# Patient Record
Sex: Female | Born: 1965 | Hispanic: No | Marital: Married | Smoking: Never smoker
Health system: Southern US, Community
[De-identification: ages and names within clinical notes are randomized; demographics above are authoritative.]

## PROBLEM LIST (undated history)

## (undated) DIAGNOSIS — R079 Chest pain, unspecified: Secondary | ICD-10-CM

## (undated) DIAGNOSIS — K2970 Gastritis, unspecified, without bleeding: Secondary | ICD-10-CM

## (undated) DIAGNOSIS — I10 Essential (primary) hypertension: Secondary | ICD-10-CM

## (undated) DIAGNOSIS — I8390 Asymptomatic varicose veins of unspecified lower extremity: Secondary | ICD-10-CM

## (undated) DIAGNOSIS — K219 Gastro-esophageal reflux disease without esophagitis: Secondary | ICD-10-CM

## (undated) DIAGNOSIS — E119 Type 2 diabetes mellitus without complications: Secondary | ICD-10-CM

## (undated) DIAGNOSIS — E785 Hyperlipidemia, unspecified: Secondary | ICD-10-CM

## (undated) HISTORY — DX: Chest pain, unspecified: R07.9

## (undated) HISTORY — DX: Gastritis, unspecified, without bleeding: K29.70

## (undated) HISTORY — DX: Gastro-esophageal reflux disease without esophagitis: K21.9

## (undated) HISTORY — DX: Essential (primary) hypertension: I10

## (undated) HISTORY — DX: Type 2 diabetes mellitus without complications: E11.9

## (undated) HISTORY — DX: Hyperlipidemia, unspecified: E78.5

## (undated) HISTORY — PX: COLONOSCOPY: SHX174

## (undated) HISTORY — DX: Asymptomatic varicose veins of unspecified lower extremity: I83.90

---

## 1998-03-16 ENCOUNTER — Emergency Department (HOSPITAL_COMMUNITY): Admission: EM | Admit: 1998-03-16 | Discharge: 1998-03-16 | Payer: Self-pay | Admitting: Emergency Medicine

## 1998-03-16 ENCOUNTER — Ambulatory Visit (HOSPITAL_COMMUNITY): Admission: RE | Admit: 1998-03-16 | Discharge: 1998-03-16 | Payer: Self-pay | Admitting: Emergency Medicine

## 1998-03-17 ENCOUNTER — Encounter: Payer: Self-pay | Admitting: Emergency Medicine

## 1998-04-02 ENCOUNTER — Other Ambulatory Visit: Admission: RE | Admit: 1998-04-02 | Discharge: 1998-04-02 | Payer: Self-pay | Admitting: Obstetrics and Gynecology

## 1998-06-06 ENCOUNTER — Ambulatory Visit (HOSPITAL_COMMUNITY): Admission: RE | Admit: 1998-06-06 | Discharge: 1998-06-06 | Payer: Self-pay | Admitting: Emergency Medicine

## 1998-06-06 ENCOUNTER — Encounter: Payer: Self-pay | Admitting: Emergency Medicine

## 1999-07-31 ENCOUNTER — Other Ambulatory Visit: Admission: RE | Admit: 1999-07-31 | Discharge: 1999-07-31 | Payer: Self-pay | Admitting: Family Medicine

## 2000-11-05 ENCOUNTER — Inpatient Hospital Stay (HOSPITAL_COMMUNITY): Admission: AD | Admit: 2000-11-05 | Discharge: 2000-11-05 | Payer: Self-pay | Admitting: Obstetrics and Gynecology

## 2000-11-07 ENCOUNTER — Inpatient Hospital Stay (HOSPITAL_COMMUNITY): Admission: AD | Admit: 2000-11-07 | Discharge: 2000-11-12 | Payer: Self-pay | Admitting: Obstetrics and Gynecology

## 2000-11-07 ENCOUNTER — Encounter (INDEPENDENT_AMBULATORY_CARE_PROVIDER_SITE_OTHER): Payer: Self-pay | Admitting: Specialist

## 2000-12-15 ENCOUNTER — Other Ambulatory Visit: Admission: RE | Admit: 2000-12-15 | Discharge: 2000-12-15 | Payer: Self-pay | Admitting: Obstetrics and Gynecology

## 2002-07-21 ENCOUNTER — Emergency Department (HOSPITAL_COMMUNITY): Admission: EM | Admit: 2002-07-21 | Discharge: 2002-07-21 | Payer: Self-pay | Admitting: Emergency Medicine

## 2002-10-01 ENCOUNTER — Ambulatory Visit: Admission: RE | Admit: 2002-10-01 | Discharge: 2002-10-01 | Payer: Self-pay | Admitting: Family Medicine

## 2002-10-01 ENCOUNTER — Encounter: Payer: Self-pay | Admitting: Family Medicine

## 2003-05-24 ENCOUNTER — Other Ambulatory Visit: Admission: RE | Admit: 2003-05-24 | Discharge: 2003-05-24 | Payer: Self-pay | Admitting: Obstetrics and Gynecology

## 2003-08-27 ENCOUNTER — Emergency Department (HOSPITAL_COMMUNITY): Admission: EM | Admit: 2003-08-27 | Discharge: 2003-08-27 | Payer: Self-pay | Admitting: Emergency Medicine

## 2004-02-19 ENCOUNTER — Observation Stay (HOSPITAL_COMMUNITY): Admission: RE | Admit: 2004-02-19 | Discharge: 2004-02-20 | Payer: Self-pay | Admitting: Obstetrics and Gynecology

## 2004-05-06 ENCOUNTER — Ambulatory Visit: Payer: Self-pay | Admitting: Gastroenterology

## 2004-05-12 ENCOUNTER — Ambulatory Visit: Payer: Self-pay | Admitting: Gastroenterology

## 2004-06-19 ENCOUNTER — Ambulatory Visit: Payer: Self-pay | Admitting: Gastroenterology

## 2005-02-03 ENCOUNTER — Other Ambulatory Visit: Admission: RE | Admit: 2005-02-03 | Discharge: 2005-02-03 | Payer: Self-pay | Admitting: Obstetrics and Gynecology

## 2005-09-23 ENCOUNTER — Emergency Department (HOSPITAL_COMMUNITY): Admission: EM | Admit: 2005-09-23 | Discharge: 2005-09-23 | Payer: Self-pay | Admitting: Emergency Medicine

## 2006-03-07 ENCOUNTER — Emergency Department (HOSPITAL_COMMUNITY): Admission: EM | Admit: 2006-03-07 | Discharge: 2006-03-07 | Payer: Self-pay | Admitting: Emergency Medicine

## 2007-02-23 ENCOUNTER — Encounter: Admission: RE | Admit: 2007-02-23 | Discharge: 2007-02-23 | Payer: Self-pay | Admitting: Internal Medicine

## 2007-10-18 ENCOUNTER — Emergency Department (HOSPITAL_COMMUNITY): Admission: EM | Admit: 2007-10-18 | Discharge: 2007-10-19 | Payer: Self-pay | Admitting: Emergency Medicine

## 2008-03-12 ENCOUNTER — Emergency Department (HOSPITAL_COMMUNITY): Admission: EM | Admit: 2008-03-12 | Discharge: 2008-03-12 | Payer: Self-pay | Admitting: *Deleted

## 2008-07-31 ENCOUNTER — Encounter: Admission: RE | Admit: 2008-07-31 | Discharge: 2008-07-31 | Payer: Self-pay | Admitting: Family Medicine

## 2008-09-26 ENCOUNTER — Ambulatory Visit: Payer: Self-pay | Admitting: Vascular Surgery

## 2008-12-14 ENCOUNTER — Ambulatory Visit: Payer: Self-pay | Admitting: Vascular Surgery

## 2009-01-04 ENCOUNTER — Observation Stay (HOSPITAL_COMMUNITY): Admission: EM | Admit: 2009-01-04 | Discharge: 2009-01-04 | Payer: Self-pay | Admitting: Emergency Medicine

## 2009-03-02 ENCOUNTER — Emergency Department (HOSPITAL_COMMUNITY): Admission: EM | Admit: 2009-03-02 | Discharge: 2009-03-02 | Payer: Self-pay | Admitting: Emergency Medicine

## 2010-06-30 ENCOUNTER — Encounter: Payer: Self-pay | Admitting: Family Medicine

## 2010-09-12 LAB — POCT I-STAT, CHEM 8
Calcium, Ion: 1.2 mmol/L (ref 1.12–1.32)
HCT: 42 % (ref 36.0–46.0)
Potassium: 3.3 mEq/L — ABNORMAL LOW (ref 3.5–5.1)
Sodium: 140 mEq/L (ref 135–145)

## 2010-09-12 LAB — COMPREHENSIVE METABOLIC PANEL
ALT: 20 U/L (ref 0–35)
Alkaline Phosphatase: 90 U/L (ref 39–117)
BUN: 9 mg/dL (ref 6–23)
CO2: 27 mEq/L (ref 19–32)
Chloride: 104 mEq/L (ref 96–112)
Creatinine, Ser: 0.66 mg/dL (ref 0.4–1.2)
GFR calc Af Amer: >60 mL/min (ref >60)
Sodium: 138 mEq/L (ref 135–145)
Total Bilirubin: 1.2 mg/dL (ref 0.3–1.2)
Total Protein: 7.7 g/dL (ref 6.0–8.3)

## 2010-09-12 LAB — URINALYSIS, ROUTINE W REFLEX MICROSCOPIC
Bilirubin Urine: NEGATIVE
Glucose, UA: NEGATIVE mg/dL
Ketones, ur: NEGATIVE mg/dL
Nitrite: NEGATIVE
Protein, ur: NEGATIVE mg/dL
Specific Gravity, Urine: 1.009 (ref 1.005–1.030)
Urobilinogen, UA: 0.2 mg/dL (ref 0.0–1.0)

## 2010-09-12 LAB — DIFFERENTIAL
Eosinophils Absolute: 0.1 10*3/uL (ref 0.0–0.7)
Monocytes Absolute: 0.7 10*3/uL (ref 0.1–1.0)
Neutrophils Relative %: 71 % (ref 43–77)

## 2010-09-12 LAB — URINE MICROSCOPIC-ADD ON

## 2010-09-12 LAB — CBC
MCHC: 34 g/dL (ref 30.0–36.0)
Platelets: 273 10*3/uL (ref 150–400)
RBC: 4.87 MIL/uL (ref 3.87–5.11)

## 2010-09-12 LAB — POCT PREGNANCY, URINE: Preg Test, Ur: NEGATIVE

## 2010-10-21 NOTE — Procedures (Signed)
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   EXAM:  Using color-flow imaging and pulse Doppler spectral analysis, the  right and left common femoral, superficial femoral, popliteal, posterior  tibial, greater and lesser saphenous veins are evaluated.  There is  evidence suggesting deep venous insufficiency in the right and left  lower extremity common femoral veins.   The right and left saphenofemoral junctions are not competent.  The  right and left greater saphenous veins are not competent with the  caliber as described below.   The right and left proximal short saphenous veins demonstrate  competency.   GSV Diameter (used if found to be incompetent only)                                            Right    Left  Proximal Greater Saphenous Vein           0.73 cm  0.53 cm  Proximal-to-mid-thigh                     cm       cm  Mid thigh                                 0.54 cm  0.44 cm  Mid-distal thigh                          cm       cm  Distal thigh                              0.80 cm  0.37 cm  Knee                                      0.51 cm  0.31 cm   IMPRESSION:  1. Right greater saphenous vein reflux is identified with the caliber      ranging from 0.43 cm to 0.80 cm knee to groin, the left from 0.31      cm to 0.53 cm.  2. The right and left greater saphenous veins are not aneurysmal.  3. The right and left greater saphenous veins are not tortuous.  4. The deep venous system is not competent in the common femoral      veins.  5. The right and left lesser saphenous veins are competent.       ___________________________________________  Larina Earthly, M.D.   AS/MEDQ  D:  09/27/2008  T:  09/27/2008  Job:  [id number]

## 2010-10-21 NOTE — Assessment & Plan Note (Signed)
OFFICE VISIT   LAURENA, VALKO  DOB:  Sep 27, 1965                                       12/14/2008  [id number]#:[id number]   Patient who presents today for continued follow-up of her bilateral  lower extremity venous pathology.  She has worn compression garments and  has had no relief of her pain.  This is worse than the right leg than on  the left and is markedly prominent over her calf varicosities and also  lateral ankle varices.  She has marked restriction in her activities of  daily living.   On physical exam, there has been no change.  By ultrasound, she does  have gross reflux in her great saphenous vein.   I discussed options with patient, explained the procedure of laser  ablation of her great saphenous vein and stab phlebectomy of multiple  tributaries varicosities.  She reports that she is having significant  pain and marked telangiectasia around her ankle; therefore, we would  recommend sclerotherapy in the same setting.  She no longer has  insurance since her last visit with [place].  We did explain the out-of-  pocket expense that would be associated with this procedure.  I have  stressed to the patient that this is not limb-threatening and would be  for symptom relief.  She will notify us should she wish to proceed with  treatment.   Larina Earthly, M.D.  Electronically Signed   TFE/MEDQ  D:  12/14/2008  T:  12/17/2008  Job:  [id number]

## 2010-10-21 NOTE — Consult Note (Signed)
NEW PATIENT CONSULTATION   [name], [name]  DOB:  05/09/1966                                       09/26/2008  NFAOZ#:[id number]   Patient presents today for evaluation of bilateral lower extremity  venous pathology.  She is a pleasant 45 year old female who reports  progressive pain over multiple tributary varicosities in both lower  extremities.  These are most pronounced in her right leg, both in her  medial calf and lateral right ankle.  She also has varicosities in her  left medial calf.  She reports that she has pain, most particularly at  the end of the prolonged standing throughout the day but does notice  some pain in the morning as well.  She reports that she does have  swelling at the end of the long day of standing as well.  She takes  Tylenol for the pain.  She is unable to take ibuprofen due to gastritis.  She does elevate her legs as much as possible and reports this does get  some mild relief.   PAST MEDICAL HISTORY:  Otherwise positive only for hypertension.  She  does not have any history of deep venous thrombosis or bleeding.   PHYSICAL EXAMINATION:  A well-developed and well-nourished Hispanic  female in no acute distress.  She has 2+ dorsalis pedis pulses  bilaterally.  She does have large tributary varicosities in both medial  calves and also in her right lateral ankle.   She underwent noninvasive vascular laboratory studies in our office, and  these reveal reflux throughout her greater saphenous veins bilaterally  with flow from the greater saphenous vein into these tributary  varicosities.   She had seen [facility] 1 month ago and was fitted for panty-  style graduated compression garments.  She reports that these have not  given her any relief to date, and she will continue in the use of these.  She reports that the doctors at this center were not on her insurance  plan, so she is seeing [place] for further evaluation.   I  discussed the significance of her venous hypertension with the  patient.  I explained the importance of continued elevation and  compression, as she is doing.  I plan to see her again in 2 months for  continued followup.  I did explain that her noninvasive vascular  laboratory studies show that she is an excellent candidate for ablation  of her saphenous vein and stab phlebectomy if conservative measures  fail.  We will see her again in 2 months.  We have also requested  records from [facility].   Larina Earthly, M.D.  Electronically Signed   TFE/MEDQ  D:  09/26/2008  T:  09/27/2008  Job:  [id number]

## 2010-10-24 NOTE — Op Note (Signed)
NAMEFABIAN, COCA NO.:  [id number]   MEDICAL RECORD NO.:  [id number]                   PATIENT TYPE:  AMB   LOCATION:  SDC                                  FACILITY:  [facility]   PHYSICIAN:  Miguel Aschoff, M.D.                    DATE OF BIRTH:  03/07/1962   DATE OF PROCEDURE:  02/19/2004  DATE OF DISCHARGE:                                 OPERATIVE REPORT   PREOPERATIVE DIAGNOSIS:  Chronic pelvic pain.   POSTOPERATIVE DIAGNOSES:  1.  Chronic pelvic pain.  2.  Pelvic adhesions and probable adenomyosis.   PROCEDURE:  Open diagnostic laparoscopy with laser uterosacral nerve  ablation and lysis of adhesions.   SURGEON:  Miguel Aschoff, M.D.   ANESTHESIA:  General.   COMPLICATIONS:  None.   JUSTIFICATION:  The patient is a 45 year old Hispanic female, gravida 5,  para 5-0-0-5 status post five cesarean sections and tubal sterilization.  The patient has been complaining of persistent lower abdominal pain  unresponsive to conservative medical therapy with antibiotics and  analgesics.  Workup as an outpatient with outpatient ultrasound did not  reveal a source of this pain and she presents now to undergo laparoscopy to  see if an etiology for the pain can be established and corrected.  The risks  and benefits of the procedure were discussed with the patient.   PROCEDURE IN DETAIL:  The patient was taken to the operating room, placed in  a supine position and general anesthesia was administered without  difficulty.  She was then placed in the dorsal lithotomy position, prepped  and draped in the usual sterile fashion.  The bladder was catheterized.  At  this point, a Hulka tenaculum was placed through the cervix and held.  Attention was then directed to the umbilicus where a small, infraumbilical  incision was made.  The incision was extended out through the subcutaneous  tissue until the fascia was identified.  The fascia was then incised and the  peritoneal  cavity was entered avoiding injury to any underlying structures.  At this point, the trocar to laparoscopic was placed and the abdomen was  insufflated with 3 L of CO2 under monitored pressure.  The laparoscope was  then inserted to allow visualization.  A 5 mm suprapubic port was  established under direct visualization.  Systematic inspection did not  reveal any adhesions on the anterior abdominal wall.  The uterus was  identified and appeared to be normal except for the presence of scarring in  the  lower uterine segment and the area of her previous cesarean sections  with the bladder peritoneum scar to the uterine wall.  The right tube was  inspected.  A portion of the tube was missing consistent with patient's  history of previous tubal sterilization.  The distal end was normal.  The  right ovary was normal.  On the left  side, a single band of adhesions was  noted running from the pelvic peritoneum to the left distal tube and  omentum.  This was easily lysed without difficulty.  The left ovary was  inspected and appeared to be normal except for the appearance of a small  cyst.  There appeared to be paratubal cysts present at the distal end of the  left tube.  The cul-de-sac was inspected and there was no definite  endometriosis noted.  The intestinal surfaces were unremarkable.  The liver  was unremarkable.  At this point the YAG laser was inserted through the  operating channel of the laparoscope and with 15 watts of power and with a  GRP 6 laser tip, it was elected to proceed with laser uterosacral nerve  ablation to see if the patient's pain could be corrected.  It was felt at  this point, due to the old cesarean sections, that the patient probably had  adenomyosis involving the C-section scar and this was contributing to her  pain.  The laser uterosacral nerve ablation was carried out without  difficulty with care to avoid any injury to the adjacent structures and  ureter.  At this  point, the YAG laser was used to open and drain the left  ovarian cyst as well as the left paratubal cyst.  Once this was done, a  final inspection was made for hemostasis.  Hemostasis appeared to be  excellent and at this point, it was elected to complete the procedure.  CO2  was allowed to escape.  All instruments were removed.  The fascia was closed  using running 0 Vicryl suture.  Subcutaneous tissue at the umbilicus was  closed using interrupted 0 Vicryl suture.  The skin incisions were closed  using subcuticular 3-0 Vicryl suture.  Port sites were then injected with  0.25% Marcaine.   Plan was for the patient to be sent home.  Medications for home include  Tylox, one every 3 hours as needed for pain and doxycycline one twice a day  x3 days.  The patient will be seen back in the office in 2-3 weeks for  follow-up exam.  She is to call with any problems such as fever, pain, or  heavy bleeding.                                               Miguel Aschoff, M.D.    AR/MEDQ  D:  02/19/2004  T:  02/19/2004  Job:  [id number]

## 2010-10-24 NOTE — Op Note (Signed)
[facility]  PatientALESANDRA, SMART Visit Number: [id number] MRN: [id number]          Service Type: OBS Location: 910A [id number] [id number] Attending Physician:  Marcelle Overlie Proc. Date: 11/07/00 Admit Date:  11/07/2000                             Operative Report  PREOPERATIVE DIAGNOSES:       1. Intrauterine pregnancy at 37-5/7 weeks.                               2. Previous cesarean section x 4.                               3. Spontaneous rupture of membranes.                               4. Desires permanent sterilization.  POSTOPERATIVE DIAGNOSES:      1. Intrauterine pregnancy at 37-5/7 weeks.                               2. Previous cesarean section x 4.                               3. Spontaneous rupture of membranes.                               4. Desires permanent sterilization.  PROCEDURE:                    Repeat low transverse cesarean section and bilateral tubal ligation.  SURGEON:                      Marcelle Overlie, M.D.  ANESTHESIA:                   Spinal.  ESTIMATED BLOOD LOSS:         500 cc.  FINDINGS:                     Female infant in the cephalic presentation with Apgars of 9 at one minute and 9 at five minutes with a weight of 6 lb 9 oz. Normal adnexa.  DESCRIPTION OF PROCEDURE:     The patient was taken to the operating room.  A spinal was placed and the patient was then prepped and draped in the usual sterile fashion after a Foley was placed in the bladder.  Using a scalpel, a low transverse incision was made and carried down to the fascia using electrocautery.  The fascia was scored in the midline and extended laterally. A Pfannenstiel incision was created.  The rectus muscles were separated.  THe peritoneum was entered bluntly.  The peritoneal incision was then extended superiorly and inferiorly.  A bladder blade was inserted.  The lower uterine segment was identified.  A bladder flap was then created  sharply and digitally and the bladder blade was readjusted.  A low transverse incision was made in the uterus and amniotic fluid was noted to be clear  upon entry into the amniotic cavity.  The baby was in the cephalic presentation.  It was a female infant with Apgars of 9 at one minute and 9 at five minutes, weighing 6 lb 9 oz.  Cord blood was obtained.  The placenta was manually removed and noted to be normal and intact.  The uterus was cleared of all clots and debris.  The uterine incision was closed in a single layer using 0 chromic in continuous running lock stitch.  Attention was then turned to the fallopian tubes, where both fallopian tubes were visualized and the fimbriated ends were easily seen. Using a Babcock clamp, the mid  portion of the tube was then grasped on both sides and a modified Pomeroy tubal ligation was performed bilaterally by tying off a 2 cm knuckle using 0 plain gut suture x 2.  The knuckle was excised using Metzenbaum scissors.  Both tubal segments were sent to pathology.  The uterine incision was then reinspected and noted to be hemostatic.  The peritoneum was closed using 0 Vicryl in continuous running stitch.  The fascia was closed using 0 Vicryl in continuous running stitch starting at each corner and meeting in the midline.  After inspection of the subcutaneous layer, the skin was closed with staples.  All sponge, lap, and instrument counts were correct x 2.  The patient tolerated the procedure well and went to the recovery room in stable condition. Attending Physician:  Marcelle Overlie DD:  11/07/00 TD:  11/08/00 Job: [id number]

## 2010-10-24 NOTE — Discharge Summary (Signed)
NAMEJOSALYNN, JOHNDROW NO.:  [id number]   MEDICAL RECORD NO.:  [id number]          PATIENT TYPE:  OBV   LOCATION:  9316                          FACILITY:  [facility]   PHYSICIAN:  Miguel Aschoff, M.D.       DATE OF BIRTH:  03/07/1962   DATE OF ADMISSION:  02/19/2004  DATE OF DISCHARGE:  02/20/2004                                 DISCHARGE SUMMARY   ADMISSION DIAGNOSIS:  Chronic pelvic pain.   FINAL DIAGNOSIS:  Chronic pelvic pain with probable adenomyosis.   PROCEDURE:  Diagnostic laparoscopy with lysis of adhesions and laser  uterosacral nerve ablation.   HISTORY OF PRESENT ILLNESS:  The patient is a 45 year old Hispanic female  with history of persistent lower abdominal pain and left lower quadrant  pain.  The patient has had four previous C-sections.  She was also  laparoscoped in 1997 and found to have pelvic adhesions.  Because of the  recurrent symptomatology, she is being taken to the operating room to  undergo repeat laparoscopy to see if the etiology for the pain can be  established and corrected.   HOSPITAL COURSE:  Preoperative labs were obtained.  On February 19, 2004,  the patient was taken to the operating room where an open laparoscopy was  carried out.  At that time she was found to only have a very few adhesions,  and the only other remarkable finding was that the uterus appeared to be  globular.  In view of the previous cesarean sections, it was felt that the  patient probably had adenomyosis as an etiology of her pain, with  adenomyosis involving the C-section scars.  In an attempt to relieve the  pain, a laser uterosacral ablation was carried out of the uterosacral  ligament.  This was done without difficulty.  The patient was observed  overnight, and by February 20, 2004 the patient was in satisfactory  condition and felt to be stable enough to be discharged home.   DISCHARGE MEDICATIONS:  1.  Tylox one every three hours as needed for pain.  2.   Doxycycline 100 mg twice a day x3 days.   DISCHARGE INSTRUCTIONS:  She was instructed to do no heavy lifting, to place  nothing in the vagina, and call if there are any problems such as fever,  pain, or heavy bleeding.   FOLLOW UP:  She is to be seen back in the office in four weeks for followup  examination.      AR/MEDQ  D:  03/23/2004  T:  03/24/2004  Job:  [id number]

## 2010-10-24 NOTE — Discharge Summary (Signed)
[facility]  Patient:    [name] [name], [name] [name]                        MRN: [id number] Adm. Date:  [id number] Disc. Date: [id number] Attending:  Marcelle Overlie Dictator:   Leilani Able, P.A.                           Discharge Summary  FINAL DIAGNOSES:              1. Intrauterine pregnancy at 37-5/[redacted] weeks                                  gestation.                               2. History of previous cesarean section x 4.                               3. Spontaneous rupture of membranes.                               4. Desires permanent sterilization.                               5. Postoperative endomyometritis.  PROCEDURE:                    Repeat low transverse cesarean section and                               bilateral tubal ligation.  SURGEON:                      Marcelle Overlie, M.D.  COMPLICATIONS:                None.  HOSPITAL COURSE:              This 45 year old, G5, P4, presents at 37-5/[redacted] weeks gestation in labor. The patients prenatal course was complicated by a history of cesarean section x 4 and advanced maternal age. The patient declined amniocentesis but did have an AFP test performed that came back normal. The patient was taken to the operating room on November 07, 2000 by Dr. Marcelle Overlie where a repeat low transverse cesarean section was performed with the delivery of a 6-pound 9-ounce female infant with Apgars of 9 and 9. Delivery went without complications. At this point, a bilateral tubal ligation was performed using the modified Pomeroy method. That procedure went without complications as well.  The patients postoperative course was complicated by lower abdominal pain and heavy bleeding that was felt to be endomyometritis.  The patient never had any high fevers with this. She was started, on postoperative day #3, on Unasyn 3 g every six hours and was kept for evaluation. The patient also had some headaches  postoperatively. Anesthesia took care of this using caffeine, fluids, position and headaches were better. By postoperative day #5, the patients abdominal pain was resolved and so were headaches. She was felt ready for discharge. She was sent home  on a regular diet and told to decrease activities.  DISCHARGE MEDICATIONS:        1. Tylox, #30, one every six hours as needed                                  for pain.                               2. Continue prenatal vitamins.  DISCHARGE FOLLOWUP:           The patient was told to follow up in the office in four weeks. DD:  12/02/00 TD:  12/02/00 Job: [id number]

## 2011-07-01 ENCOUNTER — Encounter: Payer: Self-pay | Admitting: Vascular Surgery

## 2011-07-06 ENCOUNTER — Encounter: Payer: Self-pay | Admitting: Vascular Surgery

## 2011-07-07 ENCOUNTER — Encounter: Payer: Self-pay | Admitting: Vascular Surgery

## 2011-07-07 ENCOUNTER — Ambulatory Visit (INDEPENDENT_AMBULATORY_CARE_PROVIDER_SITE_OTHER): Payer: BC Managed Care – PPO | Admitting: Vascular Surgery

## 2011-07-07 VITALS — BP 155/91 | HR 80 | Resp 16 | Ht 62.0 in | Wt 135.0 lb

## 2011-07-07 DIAGNOSIS — I83893 Varicose veins of bilateral lower extremities with other complications: Secondary | ICD-10-CM

## 2011-07-07 DIAGNOSIS — Z01810 Encounter for preprocedural cardiovascular examination: Secondary | ICD-10-CM

## 2011-07-07 DIAGNOSIS — M79609 Pain in unspecified limb: Secondary | ICD-10-CM

## 2011-07-07 DIAGNOSIS — I8390 Asymptomatic varicose veins of unspecified lower extremity: Secondary | ICD-10-CM | POA: Insufficient documentation

## 2011-07-07 NOTE — Progress Notes (Signed)
The patient has today for continued discussion regarding her venous varicosities. Last seen her in July 2010. She has severe venous hypertension bilaterally with bilateral varicose veins. She reports increasing pain. She had has been compliant with her compression garments but reports despite this she continues to have pain over the varicosities in her medial calves bilaterally and on her right lateral area above her ankle. Her past medical history is otherwise unchanged she has no history of DVT.  Past Medical History  Diagnosis Date  . Hypertension   . Varicose veins   . Acid reflux   . Gastritis   . Endometriosis     History  Substance Use Topics  . Smoking status: Former Games developer  . Smokeless tobacco: Not on file  . Alcohol Use: No    History reviewed. No pertinent family history.  No Known Allergies  Current outpatient prescriptions:butalbital-acetaminophen-caffeine (FIORICET, ESGIC) 50-325-40 MG per tablet, as needed., Disp: , Rfl: ;  enalapril-hydrochlorothiazide (VASERETIC) 10-25 MG per tablet, daily., Disp: , Rfl: ;  fenofibrate (TRICOR) 48 MG tablet, , Disp: , Rfl: ;  FLUoxetine (PROZAC) 20 MG capsule, , Disp: , Rfl: ;  ketoconazole (NIZORAL) 2 % cream, as needed., Disp: , Rfl:   BP 155/91  Pulse 80  Resp 16  Ht 5\' 2"  (1.575 m)  Wt 135 lb (61.236 kg)  BMI 24.69 kg/m2  SpO2 99%  Body mass index is 24.69 kg/(m^2).       Physical exam a well-nourished female appearing stated age in no acute distress. She has 2+ radial pulses bilaterally and 2+ dorsalis pedis pulses bilaterally. She does have marked varicosities in her medial calves and lateral and bilaterally worse on the right than on the left. She underwent repeat venous duplex Rovsing shows no change. There is no evidence of DVT. She does have reflux in her great saphenous vein and also her small saphenous veins bilaterally. On imaging these with the SonoSite it appears that they are related to her right saphenous vein  reflux.  Impression and plan: I have explained the critical importance of elevation and thigh-high graduated compression. We will see her again in 3 months for continued discussion. I feel that she would be an outstanding candidate for staged bilateral laser ablation and stab phlebectomy for relief of her symptoms. We'll discuss this further in 3 months

## 2011-07-09 NOTE — Procedures (Unsigned)
LOWER EXTREMITY VENOUS REFLUX EXAM  INDICATION:  Varicose veins and lower extremity pain.  EXAM:  Using color-flow imaging and pulse Doppler spectral analysis, the bilateral common femoral, superficial femoral, popliteal, posterior tibial, greater and lesser saphenous veins are evaluated.  There is evidence suggesting deep venous insufficiency in the right posterior tibial vein.  There is no evidence suggesting deep venous insufficiency in the remainder of the bilateral lower extremities.  The bilateral saphenofemoral junctions are competent. The bilateral GSVs are not competent with Reflux of >554milliseconds with the caliber as described below.   The bilateral proximal short saphenous vein demonstrates incompetency. The right diameter measurements range from 0.27 to 0.48 cm.  The left diameter measurements range from 0.19 cm to 0.37 cm.  GSV Diameter (used if found to be incompetent only)                                           Right    Left Proximal Greater Saphenous Vein           0.62 cm  0.66 cm Proximal-to-mid-thigh                     0.58 cm  0.57 cm Mid thigh                                 0.58 cm  0.43 cm Mid-distal thigh                          cm       cm Distal thigh                              0.79 cm  0.42 cm Knee                                      0.54 cm  0.72 cm  IMPRESSION: 1. The bilateral greater saphenous veins are not competent, with     reflux of >500 milliseconds. 2. The bilateral greater saphenous veins and right small saphenous     vein are not tortuous. 3. The left small saphenous vein is tortuous. 4. The deep venous system of the right posterior tibial vein is not     competent, with reflux of >500 milliseconds. 5. The remainder of the deep venous system is competent. 6. The bilateral small saphenous veins are not competent, with reflux     of >500 milliseconds. 7. There is a perforator vein measuring 0.2 cm involving the  right mid     medial calf that is incompetent, with reflux of >500 milliseconds. 8. There is an incompetent perforator vein measuring 0.2 cm in     diameter involving the left mid/distal medial calf, with reflux of     >500 milliseconds.       ___________________________________________ Larina Earthly, M.D.  SH/MEDQ  D:  07/07/2011  T:  07/07/2011  Job:  [id number]

## 2011-08-20 ENCOUNTER — Other Ambulatory Visit: Payer: Self-pay | Admitting: Geriatric Medicine

## 2011-08-20 DIAGNOSIS — R51 Headache: Secondary | ICD-10-CM

## 2011-08-24 ENCOUNTER — Other Ambulatory Visit: Payer: BC Managed Care – PPO

## 2011-10-05 ENCOUNTER — Encounter: Payer: Self-pay | Admitting: Vascular Surgery

## 2011-10-06 ENCOUNTER — Ambulatory Visit (INDEPENDENT_AMBULATORY_CARE_PROVIDER_SITE_OTHER): Payer: BC Managed Care – PPO | Admitting: Vascular Surgery

## 2011-10-06 ENCOUNTER — Encounter: Payer: Self-pay | Admitting: Vascular Surgery

## 2011-10-06 VITALS — BP 142/84 | HR 69 | Resp 18 | Ht 62.0 in | Wt 145.6 lb

## 2011-10-06 DIAGNOSIS — I83893 Varicose veins of bilateral lower extremities with other complications: Secondary | ICD-10-CM

## 2011-10-06 NOTE — Progress Notes (Signed)
Problems with Activities of Daily Living Secondary to Leg Pain  1. 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  This is very difficult due to leg pain.    2. Mrs. [name] [name] states cooking, cleaning, and shopping are difficult due to leg pain.    [name], [name] Rhymes   Failure of  Conservative Therapy:  1. Worn 20-30 mm Hg thigh high compression hose >3 months with no relief of symptoms.  2. Frequently elevates legs-no relief of symptoms  3. Taken Ibuprofen 600 Mg TID with no relief of symptoms.  The patient has today for continued discussion regarding her venous hypertension. She has worn her thigh graduated compression stockings and continues to have significant pain associated with this. Is mainly over the varicosities in her medial calves and at the level of her right distal above ankle lateral leg. She has is despite elevation and compression. This does interfere with her activities of daily living. I have recommended staged bilateral laser ablation of her great saphenous vein and stab phlebectomy of tributary varicosities. 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 She does have some small saphenous vein reflux. I would not recommend treating these currently in the same that she will have good relief with a great saphenous ablation. She has persistent symptoms she may require small saphenous ablation in the future. She wish to proceed as soon as possible

## 2011-10-13 ENCOUNTER — Telehealth: Payer: Self-pay | Admitting: *Deleted

## 2011-10-13 NOTE — Telephone Encounter (Signed)
Left detailed phone message explaining that [name] [name] has denied office surgery (CPT [phone] units) and [id number] (2 units).  Explained I had reviewed denial and her case with Dr. Arbie Cookey and that an appeal/peer-to-peer review would not overturn denial decision.  Recommended that she return to office in 6 months for repeat venous duplex and office visit and that we could initiate pre-certification of office surgery at that time. [name] [name], Neena Rhymes

## 2011-10-17 ENCOUNTER — Emergency Department (HOSPITAL_COMMUNITY)
Admission: EM | Admit: 2011-10-17 | Discharge: 2011-10-18 | Disposition: A | Discharge status: Home / Self Care | Payer: BC Managed Care – PPO | Attending: Emergency Medicine | Admitting: Emergency Medicine

## 2011-10-17 DIAGNOSIS — I10 Essential (primary) hypertension: Secondary | ICD-10-CM | POA: Insufficient documentation

## 2011-10-17 DIAGNOSIS — K219 Gastro-esophageal reflux disease without esophagitis: Secondary | ICD-10-CM | POA: Insufficient documentation

## 2011-10-17 DIAGNOSIS — R7401 Elevation of levels of liver transaminase levels: Secondary | ICD-10-CM | POA: Insufficient documentation

## 2011-10-17 DIAGNOSIS — R7402 Elevation of levels of lactic acid dehydrogenase (LDH): Secondary | ICD-10-CM | POA: Insufficient documentation

## 2011-10-17 DIAGNOSIS — R1013 Epigastric pain: Secondary | ICD-10-CM

## 2011-10-17 DIAGNOSIS — R110 Nausea: Secondary | ICD-10-CM | POA: Insufficient documentation

## 2011-10-18 ENCOUNTER — Encounter (HOSPITAL_COMMUNITY): Payer: Self-pay | Admitting: Emergency Medicine

## 2011-10-18 ENCOUNTER — Emergency Department (HOSPITAL_COMMUNITY): Payer: BC Managed Care – PPO

## 2011-10-18 LAB — DIFFERENTIAL
Basophils Relative: 0 % (ref 0–1)
Eosinophils Absolute: 0.2 10*3/uL (ref 0.0–0.7)
Monocytes Relative: 8 % (ref 3–12)
Neutrophils Relative %: 36 % — ABNORMAL LOW (ref 43–77)

## 2011-10-18 LAB — COMPREHENSIVE METABOLIC PANEL
Albumin: 3.9 g/dL (ref 3.5–5.2)
Alkaline Phosphatase: 130 U/L — ABNORMAL HIGH (ref 39–117)
BUN: 11 mg/dL (ref 6–23)
Potassium: 3.5 mEq/L (ref 3.5–5.1)
Total Protein: 7.7 g/dL (ref 6.0–8.3)

## 2011-10-18 LAB — CBC
Hemoglobin: 13.5 g/dL (ref 12.0–15.0)
MCH: 27.7 pg (ref 26.0–34.0)
MCHC: 34.1 g/dL (ref 30.0–36.0)
Platelets: 234 10*3/uL (ref 150–400)
RDW: 14 % (ref 11.5–15.5)

## 2011-10-18 LAB — URINALYSIS, ROUTINE W REFLEX MICROSCOPIC
Bilirubin Urine: NEGATIVE
Ketones, ur: NEGATIVE mg/dL
Nitrite: NEGATIVE
Protein, ur: NEGATIVE mg/dL
Urobilinogen, UA: 1 mg/dL (ref 0.0–1.0)

## 2011-10-18 LAB — POCT PREGNANCY, URINE: Preg Test, Ur: NEGATIVE

## 2011-10-18 LAB — LIPASE, BLOOD: Lipase: 55 U/L (ref 11–59)

## 2011-10-18 MED ORDER — MONTELUKAST SODIUM 10 MG PO TABS: 10.0000 mg | ORAL_TABLET | Freq: Every day | ORAL | Status: DC

## 2011-10-18 MED ORDER — OXYCODONE-ACETAMINOPHEN 5-325 MG PO TABS: 2.0000 | ORAL_TABLET | ORAL | Status: AC | PRN

## 2011-10-18 MED ORDER — SODIUM CHLORIDE 0.9 % IV SOLN
80.0000 mg | Freq: Once | INTRAVENOUS | Status: AC
  Administered 2011-10-18: 80 mg via INTRAVENOUS
  Filled 2011-10-18: qty 80

## 2011-10-18 MED ORDER — PANTOPRAZOLE SODIUM 20 MG PO TBEC: 40.0000 mg | DELAYED_RELEASE_TABLET | Freq: Every day | ORAL | Status: DC

## 2011-10-18 MED ORDER — HYDROMORPHONE HCL PF 1 MG/ML IJ SOLN
1.0000 mg | Freq: Once | INTRAMUSCULAR | Status: AC
  Administered 2011-10-18: 1 mg via INTRAVENOUS
  Filled 2011-10-18: qty 1

## 2011-10-18 MED ORDER — ONDANSETRON HCL 4 MG/2ML IJ SOLN
4.0000 mg | Freq: Once | INTRAMUSCULAR | Status: AC
  Administered 2011-10-18: 4 mg via INTRAVENOUS
  Filled 2011-10-18: qty 2

## 2011-10-18 MED ORDER — ONDANSETRON HCL 4 MG PO TABS: 4.0000 mg | ORAL_TABLET | Freq: Four times a day (QID) | ORAL | Status: AC

## 2011-10-18 NOTE — ED Notes (Signed)
NURSE EXPLAINED DELAY AND PROCESS TO PT. AND FAMILY , ADVISED THAT PT. WILL BE MOVED TO A EXAM ROOM AS SOON AS AVAILABLE.

## 2011-10-18 NOTE — Discharge Instructions (Signed)
Please take medications as prescribed. Followup with your primary care doctor and/or gastroenterology if symptoms continue. Stick to a bland diet. Return to emergency room for worsening condition or new concerning symptoms.  Abdominal Pain Abdominal pain can be caused by many things. Your caregiver decides the seriousness of your pain by an examination and possibly blood tests and X-rays. Many cases can be observed and treated at home. Most abdominal pain is not caused by a disease and will probably improve without treatment. However, in many cases, more time must pass before a clear cause of the pain can be found. Before that point, it may not be known if you need more testing, or if hospitalization or surgery is needed. HOME CARE INSTRUCTIONS   Do not take laxatives unless directed by your caregiver.   Take pain medicine only as directed by your caregiver.   Only take over-the-counter or prescription medicines for pain, discomfort, or fever as directed by your caregiver.   Try a clear liquid diet (broth, tea, or water) for as long as directed by your caregiver. Slowly move to a bland diet as tolerated.  SEEK IMMEDIATE MEDICAL CARE IF:   The pain does not go away.   You have a fever.   You keep throwing up (vomiting).   The pain is felt only in portions of the abdomen. Pain in the right side could possibly be appendicitis. In an adult, pain in the left lower portion of the abdomen could be colitis or diverticulitis.   You pass bloody or black tarry stools.  MAKE SURE YOU:   Understand these instructions.   Will watch your condition.   Will get help right away if you are not doing well or get worse.  Document Released: 03/04/2005 Document Revised: 05/14/2011 Document Reviewed: 01/11/2008 [facility] Patient Information 2012 [place], [place].  Abdominal Pain Abdominal pain can be caused by many things. Your caregiver decides the seriousness of your pain by an examination and possibly  blood tests and X-rays. Many cases can be observed and treated at home. Most abdominal pain is not caused by a disease and will probably improve without treatment. However, in many cases, more time must pass before a clear cause of the pain can be found. Before that point, it may not be known if you need more testing, or if hospitalization or surgery is needed. HOME CARE INSTRUCTIONS   Do not take laxatives unless directed by your caregiver.   Take pain medicine only as directed by your caregiver.   Only take over-the-counter or prescription medicines for pain, discomfort, or fever as directed by your caregiver.   Try a clear liquid diet (broth, tea, or water) for as long as directed by your caregiver. Slowly move to a bland diet as tolerated.  SEEK IMMEDIATE MEDICAL CARE IF:   The pain does not go away.   You have a fever.   You keep throwing up (vomiting).   The pain is felt only in portions of the abdomen. Pain in the right side could possibly be appendicitis. In an adult, pain in the left lower portion of the abdomen could be colitis or diverticulitis.   You pass bloody or black tarry stools.  MAKE SURE YOU:   Understand these instructions.   Will watch your condition.   Will get help right away if you are not doing well or get worse.  Document Released: 03/04/2005 Document Revised: 05/14/2011 Document Reviewed: 01/11/2008 [facility] Patient Information 2012 [place], [place].

## 2011-10-18 NOTE — ED Notes (Signed)
Family at bedside and pt is resting.

## 2011-10-18 NOTE — ED Notes (Signed)
Pt c/o mid abdominal pain that started 2 hours ago "after taking blood pressure medicine".  Pain increases with palpation and is epigastric and substernal.  Denies n/v/d.  When pain started, pt felt SOB.  Does not feel SOB at this time.  States left arm also hurts and this began 30 minutes ago.  Pt Spanish speaking, speaks some English but family helping to translate.

## 2011-10-18 NOTE — ED Notes (Signed)
Small bumps on cheeks now, maybe hives.  Pt states they itch a little bit.

## 2011-10-18 NOTE — ED Notes (Signed)
FAMILY REPORTS MID ABDOMINAL PAIN WITH SOB ONSET THIS EVENING , DENIES NAUSEA OR VOMITTING /DIARRHEA.

## 2011-10-18 NOTE — ED Notes (Signed)
Rx x 4, pt voiced understanding to f/u with gastroenterologist for worsening pain.

## 2011-10-19 ENCOUNTER — Telehealth: Payer: Self-pay | Admitting: *Deleted

## 2011-10-19 ENCOUNTER — Other Ambulatory Visit: Payer: Self-pay | Admitting: *Deleted

## 2011-10-19 DIAGNOSIS — I83893 Varicose veins of bilateral lower extremities with other complications: Secondary | ICD-10-CM

## 2011-10-19 NOTE — Telephone Encounter (Signed)
Spoke directly to [name] [name] about [employer] denial of office surgery for CPT codes 62952 ( endovenous laser ablation) and stab phlebectomies  (CPT [phone]).   Conveyed to her that Dr. Arbie Cookey had reviewed the denial and her chart and recommended that she return in 6 months for repeat bilateral venous reflux exam and follow up with Dr. Arbie Cookey.  [name] [name] expressed disappointment with denial but verbalized understanding and agreed with plan to return in 6 months for venous reflux exam and follow up with Dr. Arbie Cookey.  Front desk staff to make appointments and notify Ms. Zanella.  Ananiah Maciolek, Neena Rhymes

## 2011-10-20 NOTE — ED Provider Notes (Signed)
History     CSN: [id number]  Arrival date & time 10/17/11  2356   First MD Initiated Contact with Patient 10/18/11 0214      Chief Complaint  Patient presents with  . Abdominal Pain    (Consider location/radiation/quality/duration/timing/severity/associated sxs/prior treatment) HPI 46 yo female presents to the ER tonight with c/o upper abdominal pain, nausea.  Pt reports she has history of gastritis, has been off her PPI.  Over the last several weeks she has has increasing nausea.  No fevers, no change in weight, no vomiting.  Pain goes up into her back.  No sob, no chest pain.  Pain tonight started after taking her nightly medications.   Past Medical History  Diagnosis Date  . Hypertension   . Varicose veins   . Acid reflux   . Gastritis   . Endometriosis   . Migraine     Past Surgical History  Procedure Date  . Cesarean section     No family history on file.  History  Substance Use Topics  . Smoking status: Never Smoker   . Smokeless tobacco: Not on file  . Alcohol Use: No    OB History    Grav Para Term Preterm Abortions TAB SAB Ect Mult Living                  Review of Systems  All other systems reviewed and are negative.    Allergies  Review of patient's allergies indicates no known allergies.  Home Medications   Current Outpatient Rx  Name Route Sig Dispense Refill  . BUTALBITAL-APAP-CAFFEINE 50-325-40 MG PO TABS Oral Take 1 tablet by mouth every 8 (eight) hours as needed. For migraine    . FENOFIBRATE 48 MG PO TABS Oral Take 48 mg by mouth daily.     Marland Kitchen LISINOPRIL-HYDROCHLOROTHIAZIDE 20-25 MG PO TABS Oral Take 1 tablet by mouth daily.    Marland Kitchen MONTELUKAST SODIUM 10 MG PO TABS Oral Take 1 tablet (10 mg total) by mouth at bedtime. 20 tablet 0  . ONDANSETRON HCL 4 MG PO TABS Oral Take 1 tablet (4 mg total) by mouth every 6 (six) hours. PRN nausea 12 tablet 0  . OXYCODONE-ACETAMINOPHEN 5-325 MG PO TABS Oral Take 2 tablets by mouth every 4 (four) hours as  needed for pain. 20 tablet 0  . PANTOPRAZOLE SODIUM 20 MG PO TBEC Oral Take 2 tablets (40 mg total) by mouth daily. 60 tablet 0    BP 146/88  Pulse 98  Temp(Src) 98.6 F (37 C) (Oral)  Resp 18  SpO2 100%  LMP 10/06/2011  Physical Exam  Nursing note and vitals reviewed. Constitutional: She is oriented to person, place, and time. She appears well-developed and well-nourished. She appears distressed.  HENT:  Head: Normocephalic and atraumatic.  Nose: Nose normal.  Mouth/Throat: Oropharynx is clear and moist.  Eyes: Conjunctivae and EOM are normal. Pupils are equal, round, and reactive to light.  Neck: Normal range of motion. Neck supple. No JVD present. No tracheal deviation present. No thyromegaly present.  Cardiovascular: Normal rate, regular rhythm, normal heart sounds and intact distal pulses.  Exam reveals no gallop and no friction rub.   No murmur heard. Pulmonary/Chest: Effort normal and breath sounds normal. No stridor. No respiratory distress. She has no wheezes. She has no rales. She exhibits no tenderness.  Abdominal: Soft. Bowel sounds are normal. She exhibits no distension and no mass. There is tenderness (in epigastrium and in RUQ). There is no rebound and  no guarding.  Musculoskeletal: Normal range of motion. She exhibits no edema and no tenderness.  Lymphadenopathy:    She has no cervical adenopathy.  Neurological: She is oriented to person, place, and time. She exhibits normal muscle tone. Coordination normal.  Skin: Skin is dry. No rash noted. No erythema. No pallor.  Psychiatric: She has a normal mood and affect. Her behavior is normal. Judgment and thought content normal.    ED Course  Procedures (including critical care time)  Results for orders placed during the hospital encounter of 10/17/11  URINALYSIS, ROUTINE W REFLEX MICROSCOPIC      Component Value Range   Color, Urine YELLOW  YELLOW    APPearance CLEAR  CLEAR    Specific Gravity, Urine 1.012  1.005 -  1.030    pH 7.5  5.0 - 8.0    Glucose, UA NEGATIVE  NEGATIVE (mg/dL)   Hgb urine dipstick NEGATIVE  NEGATIVE    Bilirubin Urine NEGATIVE  NEGATIVE    Ketones, ur NEGATIVE  NEGATIVE (mg/dL)   Protein, ur NEGATIVE  NEGATIVE (mg/dL)   Urobilinogen, UA 1.0  0.0 - 1.0 (mg/dL)   Nitrite NEGATIVE  NEGATIVE    Leukocytes, UA NEGATIVE  NEGATIVE   CBC      Component Value Range   WBC 4.1  4.0 - 10.5 (K/uL)   RBC 4.88  3.87 - 5.11 (MIL/uL)   Hemoglobin 13.5  12.0 - 15.0 (g/dL)   HCT 02.7  25.3 - 66.4 (%)   MCV 81.1  78.0 - 100.0 (fL)   MCH 27.7  26.0 - 34.0 (pg)   MCHC 34.1  30.0 - 36.0 (g/dL)   RDW 40.3  47.4 - 25.9 (%)   Platelets 234  150 - 400 (K/uL)  DIFFERENTIAL      Component Value Range   Neutrophils Relative 36 (*) 43 - 77 (%)   Neutro Abs 1.5 (*) 1.7 - 7.7 (K/uL)   Lymphocytes Relative 52 (*) 12 - 46 (%)   Lymphs Abs 2.1  0.7 - 4.0 (K/uL)   Monocytes Relative 8  3 - 12 (%)   Monocytes Absolute 0.3  0.1 - 1.0 (K/uL)   Eosinophils Relative 4  0 - 5 (%)   Eosinophils Absolute 0.2  0.0 - 0.7 (K/uL)   Basophils Relative 0  0 - 1 (%)   Basophils Absolute 0.0  0.0 - 0.1 (K/uL)  COMPREHENSIVE METABOLIC PANEL      Component Value Range   Sodium 141  135 - 145 (mEq/L)   Potassium 3.5  3.5 - 5.1 (mEq/L)   Chloride 104  96 - 112 (mEq/L)   CO2 23  19 - 32 (mEq/L)   Glucose, Bld 111 (*) 70 - 99 (mg/dL)   BUN 11  6 - 23 (mg/dL)   Creatinine, Ser 5.63  0.50 - 1.10 (mg/dL)   Calcium 9.8  8.4 - 87.5 (mg/dL)   Total Protein 7.7  6.0 - 8.3 (g/dL)   Albumin 3.9  3.5 - 5.2 (g/dL)   AST 643 (*) 0 - 37 (U/L)   ALT 58 (*) 0 - 35 (U/L)   Alkaline Phosphatase 130 (*) 39 - 117 (U/L)   Total Bilirubin 0.3  0.3 - 1.2 (mg/dL)   GFR calc non Af Amer >90  >90 (mL/min)   GFR calc Af Amer >90  >90 (mL/min)  LIPASE, BLOOD      Component Value Range   Lipase 55  11 - 59 (U/L)  POCT PREGNANCY, URINE  Component Value Range   Preg Test, Ur NEGATIVE  NEGATIVE    Dg Chest 2 View  10/18/2011   *RADIOLOGY REPORT*  Clinical Data: Epigastric abdominal pain.  CHEST - 2 VIEW  Comparison: 03/07/2006  Findings: Shallow inspiration.  Normal heart size and pulmonary vascularity.  No focal airspace consolidation in the lungs.  No blunting of costophrenic angles.  No pneumothorax.  Stable appearance since previous study.  IMPRESSION: No evidence of active pulmonary disease.  Original Report Authenticated By: Marlon Pel, M.D.   US Abdomen Complete  10/18/2011  *RADIOLOGY REPORT*  Clinical Data:  Elevated liver function studies.  Abdominal pain.  COMPLETE ABDOMINAL ULTRASOUND  Comparison:  CT abdomen and pelvis 03/02/2009  Findings:  Gallbladder:  No gallstones, gallbladder wall thickening, or pericholecystic fluid.  Common bile duct:  Normal caliber at about 5 mm diameter.  Liver:  Liver parenchymal echotexture appears somewhat coarsened which might reflect mild fatty infiltration.  No focal lesions.  IVC:  Not visualized due to overlying bowel gas.  Pancreas:  Not visualized due to overlying bowel gas.  Spleen:  Spleen length measures 5.6 cm.  Normal parenchymal echotexture.  Right Kidney:  Right kidney measures 12 cm length.  No hydronephrosis.  Left Kidney:  Left kidney measures 11.2 cm length.  Low attenuation focus in the lower pole likely representing artifact.  Abdominal aorta:  No aneurysm identified.  IMPRESSION: Mild coarsening of liver parenchymal echotexture suggesting mild fatty infiltration.  Normal appearance of the gallbladder and visualized bile ducts.  Original Report Authenticated By: Marlon Pel, M.D.     1. Epigastric pain   2. Transaminitis       MDM  46 yo female with epigastric pain, transaminitis.  Initially concerned for cholelithiasis/cholecystitis, but u/s without abn.  Pt feeling better after medications.  Do not feel this is acs or other referred cardiac pain.  Will refer to GI for egd, place on ppi.        Olivia Mackie, MD 10/20/11 [phone]

## 2011-10-21 ENCOUNTER — Telehealth: Payer: Self-pay | Admitting: *Deleted

## 2011-10-21 NOTE — Telephone Encounter (Signed)
Conference call with Minerva Areola ([employer]), Byrd Hesselbach (Spanish interpreter), Lonn Georgia (pt's husband) and I had conference call.  I explained to all parties listed above that [employer] had denied pre-certification for office surgery (CPT codes 57846 and [phone]) based on venous duplex results not meeting [employer] criteria for medical necessity.  Explained that Dr. Arbie Cookey had reviewed the denial and Mrs. Slee's record and that he did not believe that appeal would overturn denial because denial based on [employer] criteria ultrasound results did not meet their criteria for approval.  Dr. Arbie Cookey recommended Mrs. Gell return to see him in 6 months and have repeat venous duplex at that visit and will resubmit to [facility] for pre-cert.  All parties listed above verbalized understanding of the above explanation/discussion.  All questions answered.  Tayleigh Wetherell, Neena Rhymes

## 2011-11-16 ENCOUNTER — Telehealth: Payer: Self-pay | Admitting: *Deleted

## 2011-11-16 NOTE — Telephone Encounter (Signed)
[name] [name] c/o continued leg pain that interferes with her ability to work.  She is asking whether she can see Dr. Arbie Cookey and have a venous duplex repeated.  She was seen 10-06-2011 by Dr. Arbie Cookey and was precerted for bilateral endovenous laser ablation procedures which were denied by [facility] because the venous duplex results did not meet their criteria for approval.  Have discussed this situation with [name] [name] several times including prior conference call with interpreter and representative from [facility].  Dr. Arbie Cookey stated to have [name] [name] return in 6 months for repeat Venous duplex and follow up visit with him and then [facility] will pre-cert again.  Explained to [name] [name] that if insurance denies procedure they would not pay for the procedure and that not enough time had elapsed to re-precert again.    Encouraged her to elevate legs, take Ibuprofen 600mg  prn pain, and wear compression hose daytime.  [name] [name] states she will call [employer].  Ceasia Elwell, Neena Rhymes

## 2012-01-08 ENCOUNTER — Encounter: Payer: Self-pay | Admitting: Emergency Medicine

## 2012-04-18 ENCOUNTER — Encounter: Payer: Self-pay | Admitting: Vascular Surgery

## 2012-04-19 ENCOUNTER — Ambulatory Visit: Payer: BC Managed Care – PPO | Admitting: Vascular Surgery

## 2012-05-19 ENCOUNTER — Other Ambulatory Visit (HOSPITAL_COMMUNITY): Payer: Self-pay | Admitting: Obstetrics and Gynecology

## 2012-05-19 ENCOUNTER — Other Ambulatory Visit: Payer: Self-pay | Admitting: Obstetrics and Gynecology

## 2012-05-19 DIAGNOSIS — Z1231 Encounter for screening mammogram for malignant neoplasm of breast: Secondary | ICD-10-CM

## 2012-06-15 ENCOUNTER — Encounter (HOSPITAL_COMMUNITY): Payer: Self-pay

## 2012-06-17 ENCOUNTER — Ambulatory Visit (HOSPITAL_COMMUNITY): Payer: BC Managed Care – PPO

## 2012-06-20 ENCOUNTER — Emergency Department (HOSPITAL_COMMUNITY): Payer: No Typology Code available for payment source

## 2012-06-20 ENCOUNTER — Encounter (HOSPITAL_COMMUNITY): Payer: Self-pay | Admitting: Neurology

## 2012-06-20 ENCOUNTER — Emergency Department (HOSPITAL_COMMUNITY)
Admission: EM | Admit: 2012-06-20 | Discharge: 2012-06-20 | Disposition: A | Discharge status: Home / Self Care | Payer: No Typology Code available for payment source | Attending: Emergency Medicine | Admitting: Emergency Medicine

## 2012-06-20 DIAGNOSIS — Y92410 Unspecified street and highway as the place of occurrence of the external cause: Secondary | ICD-10-CM | POA: Insufficient documentation

## 2012-06-20 DIAGNOSIS — S139XXA Sprain of joints and ligaments of unspecified parts of neck, initial encounter: Secondary | ICD-10-CM | POA: Insufficient documentation

## 2012-06-20 DIAGNOSIS — Y9389 Activity, other specified: Secondary | ICD-10-CM | POA: Insufficient documentation

## 2012-06-20 DIAGNOSIS — S161XXA Strain of muscle, fascia and tendon at neck level, initial encounter: Secondary | ICD-10-CM

## 2012-06-20 DIAGNOSIS — Z8719 Personal history of other diseases of the digestive system: Secondary | ICD-10-CM | POA: Insufficient documentation

## 2012-06-20 DIAGNOSIS — S0990XA Unspecified injury of head, initial encounter: Secondary | ICD-10-CM

## 2012-06-20 DIAGNOSIS — Z8742 Personal history of other diseases of the female genital tract: Secondary | ICD-10-CM | POA: Insufficient documentation

## 2012-06-20 DIAGNOSIS — Z8679 Personal history of other diseases of the circulatory system: Secondary | ICD-10-CM | POA: Insufficient documentation

## 2012-06-20 DIAGNOSIS — I10 Essential (primary) hypertension: Secondary | ICD-10-CM | POA: Insufficient documentation

## 2012-06-20 DIAGNOSIS — Z79899 Other long term (current) drug therapy: Secondary | ICD-10-CM | POA: Insufficient documentation

## 2012-06-20 MED ORDER — OXYCODONE-ACETAMINOPHEN 5-325 MG PO TABS
1.0000 | ORAL_TABLET | Freq: Once | ORAL | Status: AC
  Administered 2012-06-20: 1 via ORAL
  Filled 2012-06-20: qty 1

## 2012-06-20 MED ORDER — ACETAMINOPHEN 325 MG PO TABS
650.0000 mg | ORAL_TABLET | Freq: Once | ORAL | Status: AC
  Administered 2012-06-20: 650 mg via ORAL
  Filled 2012-06-20: qty 2

## 2012-06-20 MED ORDER — ONDANSETRON 4 MG PO TBDP
ORAL_TABLET | ORAL | Status: AC
  Filled 2012-06-20: qty 1

## 2012-06-20 MED ORDER — OXYCODONE-ACETAMINOPHEN 5-325 MG PO TABS: 1.0000 | ORAL_TABLET | Freq: Four times a day (QID) | ORAL | Status: DC | PRN

## 2012-06-20 NOTE — ED Notes (Signed)
Patient transported to CT 
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
## 2012-06-21 ENCOUNTER — Ambulatory Visit: Payer: Self-pay | Admitting: Internal Medicine

## 2012-06-21 DIAGNOSIS — I10 Essential (primary) hypertension: Secondary | ICD-10-CM

## 2012-06-21 DIAGNOSIS — R51 Headache: Secondary | ICD-10-CM

## 2012-06-21 DIAGNOSIS — S139XXA Sprain of joints and ligaments of unspecified parts of neck, initial encounter: Secondary | ICD-10-CM

## 2012-06-21 DIAGNOSIS — M542 Cervicalgia: Secondary | ICD-10-CM

## 2012-06-21 DIAGNOSIS — S161XXA Strain of muscle, fascia and tendon at neck level, initial encounter: Secondary | ICD-10-CM

## 2012-06-21 DIAGNOSIS — M9190 Juvenile osteochondrosis of hip and pelvis, unspecified, unspecified leg: Secondary | ICD-10-CM

## 2012-06-21 MED ORDER — LISINOPRIL-HYDROCHLOROTHIAZIDE 20-25 MG PO TABS: 1.0000 | ORAL_TABLET | Freq: Every day | ORAL | Status: DC

## 2012-06-21 MED ORDER — AMITRIPTYLINE HCL 25 MG PO TABS: ORAL_TABLET | ORAL | Status: DC

## 2012-06-21 MED ORDER — HYDROCODONE-ACETAMINOPHEN 7.5-325 MG PO TABS: 1.0000 | ORAL_TABLET | Freq: Three times a day (TID) | ORAL | Status: DC | PRN
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
## 2012-06-21 NOTE — Patient Instructions (Addendum)
Thyroid Diseases Your thyroid is a butterfly-shaped gland in your neck. It is located just above your collarbone. It is one of your endocrine glands, which make hormones. The thyroid helps set your metabolism. Metabolism is how your body gets energy from the foods you eat.  Millions of people have thyroid diseases. Women experience thyroid problems more often than men. In fact, overactive thyroid problems (hyperthyroidism) occur in 1% of all women. If you have a thyroid disease, your body may use energy more slowly or quickly than it should.  Thyroid problems also include an immune disease where your body reacts against your thyroid gland (called thyroiditis). A different problem involves lumps and bumps (called nodules) that develop in the gland. The nodules are usually, but not always, noncancerous. THE MOST COMMON THYROID PROBLEMS AND CAUSES ARE DISCUSSED BELOW There are many causes for thyroid problems. Treatment depends upon the exact diagnosis and includes trying to reset your body's metabolism to a normal rate. Hyperthyroidism Too much thyroid hormone from an overactive thyroid gland is called hyperthyroidism. In hyperthyroidism, the body's metabolism speeds up. One of the most frequent forms of hyperthyroidism is known as Graves' disease. Graves' disease tends to run in families. Although Graves' is thought to be caused by a problem with the immune system, the exact nature of the genetic problem is unknown. Hypothyroidism Too little thyroid hormone from an underactive thyroid gland is called hypothyroidism. In hypothyroidism, the body's metabolism is slowed. Several things can cause this condition. Most causes affect the thyroid gland directly and hurt its ability to make enough hormone.  Rarely, there may be a pituitary gland tumor (located near the base of the brain). The tumor can block the pituitary from producing thyroid-stimulating hormone (TSH). Your body makes TSH to stimulate the thyroid  to work properly. If the pituitary does not make enough TSH, the thyroid fails to make enough hormones needed for good health. Whether the problem is caused by thyroid conditions or by the pituitary gland, the result is that the thyroid is not making enough hormones. Hypothyroidism causes many physical and mental processes to become sluggish. The body consumes less oxygen and produces less body heat. Thyroid Nodules A thyroid nodule is a small swelling or lump in the thyroid gland. They are common. These nodules represent either a growth of thyroid tissue or a fluid-filled cyst. Both form a lump in the thyroid gland. Almost half of all people will have tiny thyroid nodules at some point in their lives. Typically, these are not noticeable until they become large and affect normal thyroid size. Larger nodules that are greater than a half inch across (about 1 centimeter) occur in about 5 percent of people. Although most nodules are not cancerous, people who have them should seek medical care to rule out cancer. Also, some thyroid nodules may produce too much thyroid hormone or become too large. Large nodules or a large gland can interfere with breathing or swallowing or may cause neck discomfort. Other problems Other thyroid problems include cancer and thyroiditis. Thyroiditis is a malfunction of the body's immune system. Normally, the immune system works to defend the body against infection and other problems. When the immune system is not working properly, it may mistakenly attack normal cells, tissues, and organs. Examples of autoimmune diseases are Hashimoto's thyroiditis (which causes low thyroid function) and Graves' disease (which causes excess thyroid function). SYMPTOMS  Symptoms vary greatly depending upon the exact type of problem with the thyroid. Hyperthyroidism-is when your thyroid is too   active and makes more thyroid hormone than your body needs. The most common cause is Graves' Disease. Too  much thyroid hormone can cause some or all of the following symptoms:  Anxiety.  Irritability.  Difficulty sleeping.  Fatigue.  A rapid or irregular heartbeat.  A fine tremor of your hands or fingers.  An increase in perspiration.  Sensitivity to heat.  Weight loss, despite normal food intake.  Brittle hair.  Enlargement of your thyroid gland (goiter).  Light menstrual periods.  Frequent bowel movements. Graves' disease can specifically cause eye and skin problems. The skin problems involve reddening and swelling of the skin, often on your shins and on the top of your feet. Eye problems can include the following:  Excess tearing and sensation of grit or sand in either or both eyes.  Reddened or inflamed eyes.  Widening of the space between your eyelids.  Swelling of the lids and tissues around the eyes.  Light sensitivity.  Ulcers on the cornea.  Double vision.  Limited eye movements.  Blurred or reduced vision. Hypothyroidism- is when your thyroid gland is not active enough. This is more common than hyperthyroidism. Symptoms can vary a lot depending of the severity of the hormone deficiency. Symptoms may develop over a long period of time and can include several of the following:  Fatigue.  Sluggishness.  Increased sensitivity to cold.  Constipation.  Pale, dry skin.  A puffy face.  Hoarse voice.  High blood cholesterol level.  Unexplained weight gain.  Muscle aches, tenderness and stiffness.  Pain, stiffness or swelling in your joints.  Muscle weakness.  Heavier than normal menstrual periods.  Brittle fingernails and hair.  Depression. Thyroid Nodules - most do not cause signs or symptoms. Occasionally, some may become so large that you can feel or even see the swelling at the base of your neck. You may realize a lump or swelling is there when you are shaving or putting on makeup. Men might become aware of a nodule when shirt collars  suddenly feel too tight. Some nodules produce too much thyroid hormone. This can produce the same symptoms as hyperthyroidism (see above). Thyroid nodules are seldom cancerous. However, a nodule is more likely to be malignant (cancerous) if it:  Grows quickly or feels hard.  Causes you to become hoarse or to have trouble swallowing or breathing.  Causes enlarged lymph nodes under your jaw or in your neck. DIAGNOSIS  Because there are so many possible thyroid conditions, your caregiver may ask for a number of tests. They will do this in order to narrow down the exact diagnosis. These tests can include:  Blood and antibody tests.  Special thyroid scans using small, safe amounts of radioactive iodine.  Ultrasound of the thyroid gland (particularly if there is a nodule or lump).  Biopsy. This is usually done with a special needle. A needle biopsy is a procedure to obtain a sample of cells from the thyroid. The tissue will be tested in a lab and examined under a microscope. TREATMENT  Treatment depends on the exact diagnosis. Hyperthyroidism  Beta-blockers help relieve many of the symptoms.  Anti-thyroid medications prevent the thyroid from making excess hormones.  Radioactive iodine treatment can destroy overactive thyroid cells. The iodine can permanently decrease the amount of hormone produced.  Surgery to remove the thyroid gland.  Treatments for eye problems that come from Graves' disease also include medications and special eye surgery, if felt to be appropriate. Hypothyroidism Thyroid replacement with levothyroxine is   the mainstay of treatment. Treatment with thyroid replacement is usually lifelong and will require monitoring and adjustment from time to time. Thyroid Nodules  Watchful waiting. If a small nodule causes no symptoms or signs of cancer on biopsy, then no treatment may be chosen at first. Re-exam and re-checking blood tests would be the recommended  follow-up.  Anti-thyroid medications or radioactive iodine treatment may be recommended if the nodules produce too much thyroid hormone (see Treatment for Hyperthyroidism above).  Alcohol ablation. Injections of small amounts of ethyl alcohol (ethanol) can cause a non-cancerous nodule to shrink in size.  Surgery (see Treatment for Hyperthyroidism above). HOME CARE INSTRUCTIONS   Take medications as instructed.  Follow through on recommended testing. SEEK MEDICAL CARE IF:   You feel that you are developing symptoms of Hyperthyroidism or Hypothyroidism as described above.  You develop a new lump/nodule in the neck/thyroid area that you had not noticed before.  You feel that you are having side effects from medicines prescribed.  You develop trouble breathing or swallowing. SEEK IMMEDIATE MEDICAL CARE IF:   You develop a fever of 102 F (38.9 C) or higher.  You develop severe sweating.  You develop palpitations and/or rapid heart beat.  You develop shortness of breath.  You develop nausea and vomiting.  You develop extreme shakiness.  You develop agitation.  You develop lightheadedness or have a fainting episode. Document Released: 03/22/2007 Document Revised: 08/17/2011 Document Reviewed: 03/22/2007 [employer] Patient Information 2013 [employer].  

## 2012-06-23 ENCOUNTER — Encounter (HOSPITAL_COMMUNITY): Payer: Self-pay | Admitting: *Deleted

## 2012-07-05 ENCOUNTER — Encounter (HOSPITAL_COMMUNITY): Payer: Self-pay

## 2012-07-05 ENCOUNTER — Ambulatory Visit (HOSPITAL_COMMUNITY): Payer: BC Managed Care – PPO

## 2012-07-05 ENCOUNTER — Ambulatory Visit (HOSPITAL_COMMUNITY)
Admission: RE | Admit: 2012-07-05 | Discharge: 2012-07-05 | Disposition: A | Discharge status: Home / Self Care | Payer: No Typology Code available for payment source | Source: Ambulatory Visit | Attending: Obstetrics and Gynecology | Admitting: Obstetrics and Gynecology

## 2012-07-05 NOTE — Progress Notes (Signed)
Patient arrived in error.

## 2012-07-23 ENCOUNTER — Ambulatory Visit: Payer: Self-pay

## 2012-07-23 ENCOUNTER — Ambulatory Visit: Payer: Self-pay | Admitting: Internal Medicine

## 2012-07-23 VITALS — BP 110/80 | HR 94 | Temp 98.3°F | Resp 16 | Ht 62.5 in | Wt 149.8 lb

## 2012-07-23 DIAGNOSIS — R945 Abnormal results of liver function studies: Secondary | ICD-10-CM

## 2012-07-23 DIAGNOSIS — I10 Essential (primary) hypertension: Secondary | ICD-10-CM

## 2012-07-23 DIAGNOSIS — R5383 Other fatigue: Secondary | ICD-10-CM

## 2012-07-23 DIAGNOSIS — M25522 Pain in left elbow: Secondary | ICD-10-CM

## 2012-07-23 DIAGNOSIS — R110 Nausea: Secondary | ICD-10-CM

## 2012-07-23 DIAGNOSIS — M542 Cervicalgia: Secondary | ICD-10-CM

## 2012-07-23 DIAGNOSIS — E041 Nontoxic single thyroid nodule: Secondary | ICD-10-CM

## 2012-07-23 LAB — POCT CBC
HCT, POC: 42.8 % (ref 37.7–47.9)
Lymph, poc: 2.2 (ref 0.6–3.4)
MCH, POC: 28.9 pg (ref 27–31.2)
MCHC: 34.1 g/dL (ref 31.8–35.4)
MCV: 84.6 fL (ref 80–97)
POC Granulocyte: 2.7 (ref 2–6.9)
POC LYMPH PERCENT: 41.4 %L (ref 10–50)
RDW, POC: 12.9 %

## 2012-07-23 MED ORDER — LISINOPRIL-HYDROCHLOROTHIAZIDE 20-25 MG PO TABS: 1.0000 | ORAL_TABLET | Freq: Every day | ORAL | Status: DC

## 2012-07-23 MED ORDER — PREDNISONE 20 MG PO TABS: ORAL_TABLET | ORAL | Status: DC
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
## 2012-07-24 LAB — COMPREHENSIVE METABOLIC PANEL
CO2: 27 mEq/L (ref 19–32)
Creat: 0.79 mg/dL (ref 0.50–1.10)
Glucose, Bld: 105 mg/dL — ABNORMAL HIGH (ref 70–99)
Total Bilirubin: 0.3 mg/dL (ref 0.3–1.2)

## 2012-07-24 LAB — TSH: TSH: 2.58 u[IU]/mL (ref 0.350–4.500)

## 2012-07-24 LAB — T4, FREE: Free T4: 1.23 ng/dL (ref 0.80–1.80)

## 2012-07-25 ENCOUNTER — Encounter: Payer: Self-pay | Admitting: Internal Medicine

## 2012-07-25 DIAGNOSIS — I10 Essential (primary) hypertension: Secondary | ICD-10-CM

## 2012-07-25 HISTORY — DX: Essential (primary) hypertension: I10

## 2012-08-06 DIAGNOSIS — Z0271 Encounter for disability determination: Secondary | ICD-10-CM

## 2012-08-22 ENCOUNTER — Ambulatory Visit (INDEPENDENT_AMBULATORY_CARE_PROVIDER_SITE_OTHER): Payer: Self-pay | Admitting: Sports Medicine

## 2012-08-22 ENCOUNTER — Encounter: Payer: Self-pay | Admitting: Emergency Medicine

## 2012-08-22 ENCOUNTER — Encounter: Payer: Self-pay | Admitting: Sports Medicine

## 2012-08-22 VITALS — BP 134/88 | HR 69 | Ht 62.5 in | Wt 149.0 lb

## 2012-08-22 MED ORDER — PREDNISONE 10 MG PO TABS: ORAL_TABLET | ORAL | Status: DC
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
## 2012-08-25 ENCOUNTER — Ambulatory Visit: Payer: Self-pay

## 2012-09-01 ENCOUNTER — Ambulatory Visit (INDEPENDENT_AMBULATORY_CARE_PROVIDER_SITE_OTHER): Payer: Self-pay | Admitting: Sports Medicine

## 2012-09-01 VITALS — BP 121/89 | Ht 62.0 in | Wt 149.0 lb

## 2012-09-01 DIAGNOSIS — Z5189 Encounter for other specified aftercare: Secondary | ICD-10-CM

## 2012-09-01 DIAGNOSIS — M542 Cervicalgia: Secondary | ICD-10-CM

## 2012-09-01 DIAGNOSIS — S161XXD Strain of muscle, fascia and tendon at neck level, subsequent encounter: Secondary | ICD-10-CM
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
## 2012-11-07 ENCOUNTER — Other Ambulatory Visit: Payer: Self-pay | Admitting: Obstetrics and Gynecology

## 2012-11-07 DIAGNOSIS — Z1231 Encounter for screening mammogram for malignant neoplasm of breast: Secondary | ICD-10-CM

## 2012-11-15 ENCOUNTER — Encounter (HOSPITAL_COMMUNITY): Payer: Self-pay

## 2012-11-15 ENCOUNTER — Ambulatory Visit (HOSPITAL_COMMUNITY)
Admission: RE | Admit: 2012-11-15 | Discharge: 2012-11-15 | Disposition: A | Discharge status: Home / Self Care | Payer: Self-pay | Source: Ambulatory Visit | Attending: Obstetrics and Gynecology | Admitting: Obstetrics and Gynecology

## 2012-11-15 VITALS — BP 108/62 | Ht 62.0 in | Wt 151.6 lb

## 2012-11-15 DIAGNOSIS — Z1239 Encounter for other screening for malignant neoplasm of breast: Secondary | ICD-10-CM

## 2012-11-15 DIAGNOSIS — N644 Mastodynia: Secondary | ICD-10-CM | POA: Insufficient documentation

## 2012-11-15 NOTE — Progress Notes (Signed)
Complaints of left center and outer breast pain that patient rates at a 4-5 out of 10.   Pap Smear:    Pap smear not completed today. Last Pap smear was December 2013 and normal per patient. Per patient no history of an abnormal Pap smear. No Pap smear results in EPIC.  Physical exam: Breasts Breasts symmetrical. No skin abnormalities bilateral breasts. No nipple retraction bilateral breasts. No nipple discharge bilateral breasts. No lymphadenopathy. No lumps palpated bilateral breasts. Patient complained of left center and outer breast tenderness on exam. Patient stated the left center was more tender than the left outer breast on exam. Referred patient to [facility] for diagnostic mammogram and possible left breast ultrasound. Appointment scheduled for Monday, December 05, 2012 at 1315     Pelvic/Bimanual No Pap smear completed today since last Pap smear was December 2013. Pap smear not indicated per BCCCP guidelines.

## 2012-11-15 NOTE — Patient Instructions (Signed)
Taught patient how to perform BSE and gave educational materials to take home. Patient did not need a Pap smear today due to last Pap smear was December 2013 per patient. Let her know BCCCP will cover Pap smears every 3 years unless has a history of abnormal Pap smears. Referred patient to [facility] for diagnostic mammogram and possible left breast ultrasound. Appointment scheduled for Monday, December 05, 2012 at 1315. Patient aware of appointment and will be there. Let patient know will follow up with her within the next couple weeks with results. Patient verbalized understanding.

## 2012-12-05 ENCOUNTER — Encounter (HOSPITAL_COMMUNITY): Payer: Self-pay

## 2012-12-29 ENCOUNTER — Other Ambulatory Visit: Payer: Self-pay | Admitting: Radiology

## 2012-12-29 ENCOUNTER — Encounter (HOSPITAL_COMMUNITY): Payer: Self-pay

## 2013-08-31 ENCOUNTER — Other Ambulatory Visit: Payer: Self-pay

## 2013-08-31 DIAGNOSIS — I10 Essential (primary) hypertension: Secondary | ICD-10-CM

## 2013-08-31 MED ORDER — LISINOPRIL-HYDROCHLOROTHIAZIDE 20-25 MG PO TABS: 1.0000 | ORAL_TABLET | Freq: Every day | ORAL | Status: DC

## 2013-09-01 ENCOUNTER — Other Ambulatory Visit: Payer: Self-pay | Admitting: Internal Medicine

## 2013-09-29 ENCOUNTER — Encounter (HOSPITAL_COMMUNITY): Payer: Self-pay | Admitting: *Deleted

## 2013-10-02 ENCOUNTER — Other Ambulatory Visit: Payer: Self-pay | Admitting: Radiology

## 2013-10-05 ENCOUNTER — Encounter: Payer: Self-pay | Admitting: Sports Medicine

## 2013-10-05 ENCOUNTER — Ambulatory Visit (INDEPENDENT_AMBULATORY_CARE_PROVIDER_SITE_OTHER): Payer: Self-pay | Admitting: Sports Medicine

## 2013-10-05 VITALS — BP 118/82 | HR 76 | Ht 62.0 in | Wt 151.0 lb

## 2013-10-05 DIAGNOSIS — M722 Plantar fascial fibromatosis: Secondary | ICD-10-CM

## 2013-10-05 HISTORY — DX: Plantar fascial fibromatosis: M72.2

## 2013-10-05 NOTE — Progress Notes (Signed)
   Subjective:    Patient ID: [name] [name], female    DOB: 1965/08/29, 48 y.o.   MRN: [id number]  HPI Comments: [name] [name] presents to [facility] with two complaints. One is related to her left shoulder, arm, and neck for which she has been previously seen and evaluated in March 2014. Her second complaint regards right foot pain.  In terms of her left arm, she has persistent soreness of her left forearm which radiates to her neck and hand. She reports decreased mobility of her left shoulder and stiffness in her neck. These symptoms do not change with use. She reports ice helps symptoms. She endorses mild weakness of left hand. She had previously been treated with oral corticosteroids for lateral epicondylitis (refused steroid injection) which she was unable to tolerate due to abdominal discomfort and ultram.  She also noted pain in her right foot that is located over the anterior aspect of the calcaneus and is exacerbated by walking.  Denies numbness, tingling in her left hand, arm, and right leg. Denies fevers, chills, sweats, change in appetite, change in weight.     Review of Systems  All other systems reviewed and are negative.      Objective:   Physical Exam  Musculoskeletal:  Left arm: No obvious deformities of hand, wrist, elbow, shoulder Tenderness to palpation of lateral forearm compartment, deltoid Tenderness and spasm of superior aspects of trapezius muscle bilaterally Normal elbow flexion, limited elbow extension at 160-170 degrees Limited active shoulder flexion and abduction Increased range of passive shoulder flexion and abduction with mild pain Normal passive shoulder external rotation Weak grip bilaterally, 5/5 wrist extension, flexion, finger abduction Normal sensation throughout  Right foot: No obvious deformities High arched foot Moderate point tenderness over proximal longitudinal arch          Assessment & Plan:   Due to  time constraints for scheduled follow-up visit, patient elected to address plantar fasciitis at this visit and to address shoulder and elbow pain at subsequent follow-up visit.  Plantar fasciitis of right foot - inserts for shoes - icing everyday heel in ice water for 10 minutes daily - stretching exercises - handout provided - follow-up in 6 weeks       Loretta Plume, MD PGY-1 [facility] [place]

## 2013-10-05 NOTE — Assessment & Plan Note (Signed)
-   inserts for shoes - icing everyday heel in ice water for 10 minutes daily - stretching exercises - handout provided - follow-up in 6 weeks

## 2013-10-09 ENCOUNTER — Ambulatory Visit: Payer: Self-pay | Admitting: Sports Medicine

## 2013-11-22 ENCOUNTER — Encounter (HOSPITAL_COMMUNITY): Payer: Self-pay
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
## 2014-04-09 ENCOUNTER — Encounter: Payer: Self-pay | Admitting: Sports Medicine

## 2015-03-06 ENCOUNTER — Ambulatory Visit (INDEPENDENT_AMBULATORY_CARE_PROVIDER_SITE_OTHER): Payer: Self-pay | Admitting: Urgent Care

## 2015-03-06 VITALS — BP 128/80 | HR 81 | Temp 98.3°F | Resp 16 | Ht 62.0 in | Wt 153.0 lb

## 2015-03-06 DIAGNOSIS — R5383 Other fatigue: Secondary | ICD-10-CM

## 2015-03-06 DIAGNOSIS — I8390 Asymptomatic varicose veins of unspecified lower extremity: Secondary | ICD-10-CM

## 2015-03-06 DIAGNOSIS — Z131 Encounter for screening for diabetes mellitus: Secondary | ICD-10-CM

## 2015-03-06 DIAGNOSIS — I10 Essential (primary) hypertension: Secondary | ICD-10-CM

## 2015-03-06 DIAGNOSIS — Z8719 Personal history of other diseases of the digestive system: Secondary | ICD-10-CM

## 2015-03-06 DIAGNOSIS — I868 Varicose veins of other specified sites: Secondary | ICD-10-CM

## 2015-03-06 DIAGNOSIS — Z8639 Personal history of other endocrine, nutritional and metabolic disease: Secondary | ICD-10-CM

## 2015-03-06 LAB — COMPREHENSIVE METABOLIC PANEL
ALK PHOS: 124 U/L — AB (ref 33–115)
ALT: 20 U/L (ref 6–29)
AST: 20 U/L (ref 10–35)
Albumin: 4.4 g/dL (ref 3.6–5.1)
BILIRUBIN TOTAL: 0.3 mg/dL (ref 0.2–1.2)
BUN: 11 mg/dL (ref 7–25)
CALCIUM: 9.8 mg/dL (ref 8.6–10.2)
CO2: 27 mmol/L (ref 20–31)
Chloride: 105 mmol/L (ref 98–110)
Creat: 0.54 mg/dL (ref 0.50–1.10)
GLUCOSE: 85 mg/dL (ref 65–99)
Potassium: 4.1 mmol/L (ref 3.5–5.3)
Sodium: 139 mmol/L (ref 135–146)
Total Protein: 7.7 g/dL (ref 6.1–8.1)

## 2015-03-06 LAB — POCT GLYCOSYLATED HEMOGLOBIN (HGB A1C): Hemoglobin A1C: 5.6

## 2015-03-06 LAB — LIPID PANEL
Cholesterol: 163 mg/dL (ref 125–200)
HDL: 24 mg/dL — AB (ref >=46)
Total CHOL/HDL Ratio: 6.8 Ratio — ABNORMAL HIGH (ref <=5.0)
Triglycerides: 425 mg/dL — ABNORMAL HIGH (ref <150)

## 2015-03-06 MED ORDER — CYCLOBENZAPRINE HCL 10 MG PO TABS: 5.0000 mg | ORAL_TABLET | Freq: Every day | ORAL | Status: DC

## 2015-03-06 MED ORDER — AMLODIPINE BESYLATE 5 MG PO TABS: 5.0000 mg | ORAL_TABLET | Freq: Every day | ORAL | Status: DC

## 2015-03-06 MED ORDER — ESOMEPRAZOLE MAGNESIUM 40 MG PO CPDR: 40.0000 mg | DELAYED_RELEASE_CAPSULE | Freq: Every day | ORAL | Status: DC

## 2015-03-06 MED ORDER — LOSARTAN POTASSIUM-HCTZ 50-12.5 MG PO TABS: 1.0000 | ORAL_TABLET | Freq: Every day | ORAL | Status: DC

## 2015-03-06 NOTE — Patient Instructions (Signed)
Hipertensión °(Hypertension) °La hipertensión, conocida comúnmente como presión arterial alta, se produce cuando la sangre bombea en las arterias con mucha fuerza. Las arterias son los vasos sanguíneos que transportan la sangre desde el corazón hacia todas las partes del cuerpo. Una lectura de la presión arterial consiste en un número más alto sobre un número más bajo, por ejemplo, 110/72. El número más alto (presión sistólica) corresponde a la presión interna de las arterias cuando el corazón bombea sangre. El número más bajo (presión diastólica) corresponde a la presión interna de las arterias cuando el corazón se relaja. En condiciones ideales, la presión arterial debe ser inferior a 120/80. °La hipertensión fuerza al corazón a trabajar más para bombear la sangre. Las arterias pueden estrecharse o ponerse rígidas. La hipertensión conlleva el riesgo de enfermedad cardíaca, ictus y otros problemas.  °FACTORES DE RIESGO °Algunos factores de riesgo de hipertensión son controlables, pero otros no lo son.  °Entre los factores de riesgo que usted no puede controlar, se incluyen:  °· La raza. El riesgo es mayor para las personas afroamericanas. °· La edad. Los riesgos aumentan con la edad. °· El sexo. Antes de los 45 años, los hombres corren más riesgo que las mujeres. Después de los 65 años, las mujeres corren más riesgo que los hombres. °Entre los factores de riesgo que usted puede controlar, se incluyen: °· No hacer la cantidad suficiente de actividad física o ejercicio. °· Tener sobrepeso. °· Consumir mucha grasa, azúcar, calorías o sal en la dieta. °· Beber alcohol en exceso. °SIGNOS Y SÍNTOMAS °Por lo general, la hipertensión no causa signos o síntomas. La hipertensión demasiado alta (crisis hipertensiva) puede causar dolor de cabeza, ansiedad, falta de aire y hemorragia nasal. °DIAGNÓSTICO  °Para detectar si usted tiene hipertensión, el médico le medirá la presión arterial mientras esté sentado, con el brazo  levantado a la altura del corazón. Debe medirla al menos dos veces en el mismo brazo. Determinadas condiciones pueden causar una diferencia de presión arterial entre el brazo izquierdo y el derecho. El hecho de tener una sola lectura de la presión arterial más alta que lo normal no significa que necesita un tratamiento. En el caso de tener una lectura de la presión arterial con un valor alto, pídale al médico que la verifique nuevamente. °TRATAMIENTO  °El tratamiento de la hipertensión arterial incluye hacer cambios en el estilo de vida y, posiblemente, tomar medicamentos. Un estilo de vida saludable puede ayudar a bajar la presión arterial alta. Quizá deba cambiar algunos hábitos. °Los cambios en el estilo de vida pueden incluir: °· Seguir la dieta DASH. Esta dieta tiene un alto contenido de frutas, verduras y cereales integrales. Incluye poca cantidad de sal, carnes rojas y azúcares agregados. °· Hacer al menos 2 ½ horas de actividad física enérgica todas las semanas. °· Perder peso, si es necesario. °· No fumar. °· Limitar el consumo de bebidas alcohólicas. °· Aprender formas de reducir el estrés. °Si los cambios en el estilo de vida no son suficientes para lograr controlar la presión arterial, el médico puede recetarle medicamentos. Quizá necesite tomar más de uno. Trabaje en conjunto con su médico para comprender los riesgos y los beneficios. °INSTRUCCIONES PARA EL CUIDADO EN EL HOGAR °· Haga que le midan de nuevo la presión arterial según las indicaciones del médico. °· Tome los medicamentos solamente como se lo haya indicado el médico. Siga cuidadosamente las indicaciones. Los medicamentos para la presión arterial deben tomarse según las indicaciones. Los medicamentos pierden eficacia al omitir las dosis. El hecho de omitir   las dosis también aumenta el riesgo de otros problemas. °· No fume. °· Contrólese la presión arterial en su casa según las indicaciones del médico. °SOLICITE ATENCIÓN MÉDICA SI:  °· Piensa  que tiene una reacción alérgica a los medicamentos. °· Tiene mareos o dolores de cabeza con recurrencia. °· Tiene hinchazón en los tobillos. °· Tiene problemas de visión. °SOLICITE ATENCIÓN MÉDICA DE INMEDIATO SI: °· Siente un dolor de cabeza intenso o confusión. °· Siente debilidad inusual, adormecimiento o que se desmayará. °· Siente dolor intenso en el pecho o en el abdomen. °· Vomita repetidas veces. °· Tiene dificultad para respirar. °ASEGÚRESE DE QUE:  °· Comprende estas instrucciones. °· Controlará su afección. °· Recibirá ayuda de inmediato si no mejora o si empeora. °Document Released: 05/25/2005 Document Revised: 10/09/2013 °ExitCare® Patient Information ©2015 [employer]. This information is not intended to replace advice given to you by your health care provider. Make sure you discuss any questions you have with your health care provider. ° °

## 2015-03-06 NOTE — Progress Notes (Signed)
MRN: [id number] DOB: [birth year]  Subjective:   HPI obtained in Spanish.  [name] [name] is a 49 y.o. female with pmh of HTN, HL, migraines presenting for chief complaint of Headache and Medication Problem  HTN - patient has been taking a combination of hydrochlorothiazide, amlodipine and ACE inhibitor. She is obtaining her medication from [place]. She feels that her blood pressure has not been managed very well and would like to revisit her regimen. Today, she reports a daily headache, fatigue, intermittent dizziness. Denies double vision, chest pain, shortness of breath, heart racing, palpitations, nausea, vomiting, abdominal pain, hematuria, lower leg swelling. She is a non-smoker.  HL - was previously taking fenofibrate but has not been on this for sometime. Admits very irregular eating habits, fluid is definitely and healthy. She also does not exercise. Review of systems as above.  Headache - patient reports that she is under significant stress with her family and her job. She did not want to go into specific details but admits that she feels overwhelmed and overburdened. She has a history of migraines but admits that her daily headaches feel different. She denies photophobia, numbness, tingling, review of systems as above. She was previously taking topiramate which she cannot get fully that it helped.  Denies any other aggravating or relieving factors, no other questions or concerns.  Henritta has a current medication list which includes the following prescription(s): acetaminophen, fluconazole, fenofibrate, hydrocodone-acetaminophen, lisinopril-hydrochlorothiazide, and prednisone. Also has No Known Allergies.  [name]  has a past medical history of Hypertension; Varicose veins; Acid reflux; Gastritis; Endometriosis; and Migraine. Also  has past surgical history that includes Cesarean section.  Objective:   Vitals: BP 128/80 mmHg  Pulse 81  Temp(Src) 98.3 F (36.8 C) (Oral)  Resp 16   Ht 5\' 2"  (1.575 m)  Wt 153 lb (69.4 kg)  BMI 27.98 kg/m2  SpO2 98%  Physical Exam  Constitutional: She is oriented to person, place, and time. She appears well-developed and well-nourished.  HENT:  Mouth/Throat: Oropharynx is clear and moist.  Eyes: Conjunctivae and EOM are normal. Pupils are equal, round, and reactive to light. No scleral icterus.  Neck: Normal range of motion. Neck supple. No thyromegaly present.  Cardiovascular: Normal rate, regular rhythm and intact distal pulses.  Exam reveals no gallop and no friction rub.   No murmur heard. Pulmonary/Chest: No respiratory distress. She has no wheezes. She has no rales.  Musculoskeletal: She exhibits no edema.  Multiple large, tortuous veins in lower legs bilaterally, R>L. Patient does not wear compression stockings.  Neurological: She is alert and oriented to person, place, and time. No cranial nerve deficit.  Skin: Skin is warm and dry. No rash noted. No erythema. No pallor.   Assessment and Plan :   1. Essential hypertension - Labs pending - Advised patient to stop taking the mixture of blood pressure medications that she was on. She is to start losartan-HCT, amlodipine. I suspect that her headaches are related mostly to her stress, lack of rest, irregular eating habits but may also be due to her blood pressure. However since this is mostly under control, I advise patient use Tylenol and hydrate well. Patient to see regular healthy meals. Recheck in 4 weeks.  2. History of hyperlipidemia - Lipid panel pending  3. Screening for diabetes mellitus - POCT glycosylated hemoglobin (Hb A1C)  - Screen due to eating habits  4. History of thyroid nodule 5. Other fatigue - TSH pending - See HTN above  6.  Varicose veins - Recommended patient wear compression stockings.  7. History of gastritis - esomeprazole (NEXIUM) 40 MG capsule; Take 1 capsule (40 mg total) by mouth daily.  Dispense: 30 capsule; Refill: [place],  PA-C [facility] [phone] 03/06/2015 3:06 PM

## 2015-03-07 ENCOUNTER — Telehealth: Payer: Self-pay | Admitting: Urgent Care

## 2015-03-07 DIAGNOSIS — E785 Hyperlipidemia, unspecified: Secondary | ICD-10-CM

## 2015-03-07 LAB — TSH: TSH: 2.726 u[IU]/mL (ref 0.350–4.500)

## 2015-03-07 MED ORDER — FENOFIBRATE 145 MG PO TABS: 145.0000 mg | ORAL_TABLET | Freq: Every day | ORAL | Status: DC

## 2015-03-07 NOTE — Telephone Encounter (Signed)
Reported mixed hyperlipidemia. Patient is to start half tablet of fenofibrate 145 mg. Prescription was sent to her pharmacy electronically. Also recommended patient make significant dietary changes. Patient is set up followup in 4 weeks.

## 2015-05-09 ENCOUNTER — Ambulatory Visit: Payer: Self-pay | Admitting: Urgent Care

## 2015-05-16 ENCOUNTER — Ambulatory Visit: Payer: Self-pay | Admitting: Urgent Care

## 2015-07-04 ENCOUNTER — Other Ambulatory Visit: Payer: Self-pay | Admitting: Urgent Care

## 2015-08-23 ENCOUNTER — Ambulatory Visit (HOSPITAL_COMMUNITY): Payer: Self-pay

## 2015-09-19 ENCOUNTER — Encounter (HOSPITAL_COMMUNITY): Payer: Self-pay

## 2015-09-19 ENCOUNTER — Ambulatory Visit (HOSPITAL_COMMUNITY)
Admission: RE | Admit: 2015-09-19 | Discharge: 2015-09-19 | Disposition: A | Discharge status: Home / Self Care | Payer: Self-pay | Source: Ambulatory Visit | Attending: Obstetrics and Gynecology | Admitting: Obstetrics and Gynecology

## 2015-09-19 VITALS — BP 110/68 | Temp 98.6°F | Ht 62.0 in | Wt 150.0 lb

## 2015-09-19 DIAGNOSIS — Z01419 Encounter for gynecological examination (general) (routine) without abnormal findings: Secondary | ICD-10-CM

## 2015-09-19 DIAGNOSIS — N644 Mastodynia: Secondary | ICD-10-CM

## 2015-09-19 NOTE — Progress Notes (Signed)
Complaints of left breast pain x one year that comes and goes. Patient rates pain at a 7 out of 10.  Pap Smear: Pap smear completed today. Last Pap smear was December 2013 and normal per patient. Per patient no history of an abnormal Pap smear. No Pap smear results in EPIC.  Physical exam: Breasts Breasts symmetrical. No skin abnormalities bilateral breasts. No nipple retraction bilateral breasts. No nipple discharge bilateral breasts. No lymphadenopathy. No lumps palpated bilateral breasts. Complaints of tenderness when palpated left areola, left nipple, and surrounding area on exam. Referred patient to [facility] for a diagnostic mammogram. Appointment scheduled for Tuesday, September 24, 2015 at 1045.  Pelvic/Bimanual    Ext Genitalia No lesions, no swelling and no discharge observed on external genitalia.         Vagina Vagina pink and normal texture. No lesions or discharge observed in vagina.          Cervix Cervix is present. Cervix pink and of normal texture. No discharge observed.     Uterus Uterus is present and palpable. Uterus in normal position and normal size.        Adnexae Bilateral ovaries present and palpable. No tenderness on palpation.          Rectovaginal No rectal exam completed today since patient had no rectal complaints. No skin abnormalities observed on exam.    Smoking History: Patient has never smoked.  Patient Navigation: Patient education provided. Access to services provided for patient through [facility] program.

## 2015-09-19 NOTE — Patient Instructions (Signed)
Educational materials on self breast awareness given. Explained to [place] that [facility]CP will cover Pap smears and HPV typing every 5 years unless has a history of abnormal Pap smears. Referred patient to [facility] for a diagnostic mammogram. Appointment scheduled for Tuesday, September 24, 2015 at 1045. Patient aware of appointment and will be there. Let patient know will follow up with her within the next couple weeks with results of Pap smear by phone. Hector P Mahrt verbalized understanding.  [place], Arvil Chaco, RN 11:13 AM

## 2015-09-20 LAB — CYTOLOGY - PAP

## 2015-09-23 ENCOUNTER — Encounter (HOSPITAL_COMMUNITY): Payer: Self-pay | Admitting: *Deleted

## 2015-10-03 ENCOUNTER — Telehealth (HOSPITAL_COMMUNITY): Payer: Self-pay | Admitting: *Deleted

## 2015-10-03 NOTE — Telephone Encounter (Signed)
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 Advised patient would need colposcopy. Patient is scheduled with the [facility] Monday May 22 2:40. All questions were answered. Patient voiced understanding. Used interpreter Lavon Paganini.

## 2015-10-28 ENCOUNTER — Encounter: Payer: Self-pay | Admitting: Obstetrics and Gynecology

## 2015-10-28 ENCOUNTER — Other Ambulatory Visit (HOSPITAL_COMMUNITY)
Admission: RE | Admit: 2015-10-28 | Discharge: 2015-10-28 | Disposition: A | Discharge status: Home / Self Care | Payer: Self-pay | Source: Ambulatory Visit | Attending: Obstetrics and Gynecology | Admitting: Obstetrics and Gynecology

## 2015-10-28 ENCOUNTER — Ambulatory Visit (INDEPENDENT_AMBULATORY_CARE_PROVIDER_SITE_OTHER): Payer: Self-pay | Admitting: Obstetrics and Gynecology

## 2015-10-28 VITALS — Wt 151.2 lb

## 2015-10-28 DIAGNOSIS — Z202 Contact with and (suspected) exposure to infections with a predominantly sexual mode of transmission: Secondary | ICD-10-CM

## 2015-10-28 DIAGNOSIS — R87622 Low grade squamous intraepithelial lesion on cytologic smear of vagina (LGSIL): Secondary | ICD-10-CM

## 2015-10-28 DIAGNOSIS — R87810 Cervical high risk human papillomavirus (HPV) DNA test positive: Secondary | ICD-10-CM

## 2015-10-28 DIAGNOSIS — Z113 Encounter for screening for infections with a predominantly sexual mode of transmission: Secondary | ICD-10-CM

## 2015-10-28 DIAGNOSIS — R87612 Low grade squamous intraepithelial lesion on cytologic smear of cervix (LGSIL): Secondary | ICD-10-CM

## 2015-10-28 LAB — POCT PREGNANCY, URINE: Preg Test, Ur: NEGATIVE

## 2015-10-28 NOTE — Progress Notes (Signed)
GYNECOLOGY CLINIC COLPOSCOPY VISIT AND PROCEDURE NOTE  50 y.o. [id number] here for colposcopy for lsil/hrhpvpos pap smear on 09/2015. Prior cervical cytology and/or colposcopy findings: none. Cites regular hx paps. The patient reports the following prior treatments to the vulva/vagina/cervix: none. The patient is not a cigarette smoker. The patient is not immunosuppressed. The patient is not pregnant. The patient is not taking anticoagulants and denies allergy to iodine.  Patient given informed consent, signed copy in the chart, time out was performed. Urine pregnancy test performed and confirmed to be negative.  Placed in lithotomy position.   Gross findings:   Vagina: normal  Vulva: normal3  Cervix: normal  Visualization after:  Acetic acid: no lesions  Green or blue filter: no lesions or abnormal vessels   Upper one-third of vagina examined, revealing: normal mucosa  Biopsies obtained: none  ECC specimen obtained: yes  Colposcopy adequate? no  All specimens were labelled and sent to pathology.  Patient was given post procedure instructions.  Will follow up pathology and manage accordingly.  Seen w/ Dr. Roselie Awkward.   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 i explained qyite possible this is an old infection, but patient wants testing, so ordering g/c/hiv/rpr   Laurey Arrow, MD OB/GYN Fellow

## 2015-10-29 LAB — RPR

## 2015-10-29 LAB — GC/CHLAMYDIA PROBE AMP (~~LOC~~) NOT AT ARMC
CHLAMYDIA, DNA PROBE: NEGATIVE
Neisseria Gonorrhea: NEGATIVE

## 2015-10-29 LAB — HIV ANTIBODY (ROUTINE TESTING W REFLEX): HIV 1&2 Ab, 4th Generation: NONREACTIVE

## 2015-10-30 ENCOUNTER — Encounter: Payer: Self-pay | Admitting: Obstetrics and Gynecology

## 2015-10-30 DIAGNOSIS — N870 Mild cervical dysplasia: Secondary | ICD-10-CM

## 2015-10-30 HISTORY — DX: Mild cervical dysplasia: N87.0

## 2015-11-01 ENCOUNTER — Telehealth: Payer: Self-pay | Admitting: *Deleted

## 2015-11-01 NOTE — Telephone Encounter (Signed)
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 Will need pap with hpv co-test in 12 months. Thanks, Olen Cordial  ----- Message -----    From: [facility] [place] Interface    Sent: 10/28/2015   3:18 PM      To: Gwynne Edinger, MD

## 2015-11-01 NOTE — Telephone Encounter (Signed)
Attempted to call patient at home/ work/ mobile numbers. There was no answer and no voice mail picked up. Will try to call at a later time.

## 2015-11-08 NOTE — Telephone Encounter (Signed)
Used [employer] Spanish interpreter [phone] to call patient. Informed patient of low grade changes per colpo, need to f/u in one year with PAP and co testing. Emphasized importance of f/u. Patient voiced understanding.

## 2015-12-02 ENCOUNTER — Encounter (HOSPITAL_COMMUNITY): Payer: Self-pay | Admitting: *Deleted

## 2016-02-03 ENCOUNTER — Ambulatory Visit (INDEPENDENT_AMBULATORY_CARE_PROVIDER_SITE_OTHER): Payer: Self-pay | Admitting: Urgent Care

## 2016-02-03 VITALS — BP 118/74 | HR 102 | Temp 98.4°F | Resp 16 | Ht 62.0 in | Wt 152.2 lb

## 2016-02-03 DIAGNOSIS — N10 Acute tubulo-interstitial nephritis: Secondary | ICD-10-CM

## 2016-02-03 DIAGNOSIS — R300 Dysuria: Secondary | ICD-10-CM

## 2016-02-03 DIAGNOSIS — R0789 Other chest pain: Secondary | ICD-10-CM

## 2016-02-03 DIAGNOSIS — R81 Glycosuria: Secondary | ICD-10-CM

## 2016-02-03 DIAGNOSIS — I10 Essential (primary) hypertension: Secondary | ICD-10-CM

## 2016-02-03 LAB — CBC
HEMATOCRIT: 40.9 % (ref 35.0–45.0)
HEMOGLOBIN: 13.7 g/dL (ref 11.7–15.5)
MCH: 27.5 pg (ref 27.0–33.0)
MCHC: 33.5 g/dL (ref 32.0–36.0)
MCV: 82.1 fL (ref 80.0–100.0)
MPV: 10.1 fL (ref 7.5–12.5)
Platelets: 263 10*3/uL (ref 140–400)
RBC: 4.98 MIL/uL (ref 3.80–5.10)
RDW: 13.5 % (ref 11.0–15.0)
WBC: 6.7 10*3/uL (ref 3.8–10.8)

## 2016-02-03 LAB — COMPLETE METABOLIC PANEL WITH GFR
ALBUMIN: 4.1 g/dL (ref 3.6–5.1)
ALK PHOS: 93 U/L (ref 33–115)
ALT: 26 U/L (ref 6–29)
AST: 23 U/L (ref 10–35)
BUN: 11 mg/dL (ref 7–25)
CHLORIDE: 106 mmol/L (ref 98–110)
CO2: 27 mmol/L (ref 20–31)
Calcium: 9.6 mg/dL (ref 8.6–10.2)
Creat: 0.69 mg/dL (ref 0.50–1.10)
GFR, Est African American: >89 mL/min (ref >=60)
GLUCOSE: 117 mg/dL — AB (ref 65–99)
POTASSIUM: 4.2 mmol/L (ref 3.5–5.3)
SODIUM: 142 mmol/L (ref 135–146)
Total Bilirubin: 0.3 mg/dL (ref 0.2–1.2)
Total Protein: 6.9 g/dL (ref 6.1–8.1)

## 2016-02-03 LAB — POCT URINALYSIS DIP (MANUAL ENTRY)
Glucose, UA: 250 — AB
NITRITE UA: POSITIVE — AB
PH UA: 5
Protein Ur, POC: 100 — AB
Spec Grav, UA: <=1.005
UROBILINOGEN UA: 4

## 2016-02-03 LAB — POC MICROSCOPIC URINALYSIS (UMFC): MUCUS RE: ABSENT

## 2016-02-03 MED ORDER — AMLODIPINE BESYLATE 5 MG PO TABS: 5.0000 mg | ORAL_TABLET | Freq: Every day | ORAL | 3 refills | Status: DC

## 2016-02-03 MED ORDER — CIPROFLOXACIN HCL 500 MG PO TABS
500.0000 mg | ORAL_TABLET | Freq: Two times a day (BID) | ORAL | 0 refills | Status: DC
Start: 2016-02-03 — End: 2016-07-01

## 2016-02-03 MED ORDER — CEFTRIAXONE SODIUM 1 G IJ SOLR
1.0000 g | Freq: Once | INTRAMUSCULAR | Status: AC
  Administered 2016-02-03: 1 g via INTRAMUSCULAR

## 2016-02-03 MED ORDER — LOSARTAN POTASSIUM-HCTZ 50-12.5 MG PO TABS: 1.0000 | ORAL_TABLET | Freq: Every day | ORAL | 3 refills | Status: DC

## 2016-02-03 NOTE — Progress Notes (Signed)
MRN: [id number] DOB: [birth year]  Subjective:   [name] [name] is a 50 y.o. female presenting for chief complaint of Dysuria (x 1 week) and Urinary Frequency (x 1 week)  Dysuria - Reports 1 week history of dysuria, urinary frequency and urinary urgency, nausea, chills. Has tried Azo with minimal relief. Has also been using APAP consistently every day. Denies fever, hematuria, flank pain, abdominal pain, pelvic pain, cloudy malordorous urine, genital rash, genital irritation and vaginal discharge, vomiting and abdominal pain. Of note, patient has a history of bladder spasms/pain. She previously saw [facility] and was prescribed Uribel, amitriptyline, Elmiron.   HTN - Managed with amlodipine, losartan-HCTZ. Admits that she has had hypokalemia in the past. Admits several month history of transient chest pain associated with shob, left arm pain. The pain occurs mostly at night, sharp, resolves after a few seconds. Denies chronic headaches, dizziness, abdominal pain, hematuria, lower leg swelling. Denies smoking cigarettes or drinking alcohol. She has never seen a cardiologist. Denies family history of MI but reports strong history of stroke.  [name] [name] has a current medication list which includes the following prescription(s): amlodipine, cyclobenzaprine, esomeprazole, fenofibrate, and losartan-hydrochlorothiazide. Patient has No Known Allergies.  [name] [name]  has a past medical history of Acid reflux; Endometriosis; Gastritis; Hypertension; Migraine; and Varicose veins. Also  has a past surgical history that includes Cesarean section.  Her family history includes Hypertension in her father and mother; Stroke in her father; Thyroid disease in her mother.   Objective:   Vitals: BP 118/74 (BP Location: Right Arm, Patient Position: Sitting, Cuff Size: Normal)   Pulse (!) 102   Temp 98.4 F (36.9 C) (Oral)   Resp 16   Ht 5\' 2"  (1.575 m)   Wt 152 lb 3.2 oz (69 kg)   LMP 05/12/2012   SpO2 97%   BMI  27.84 kg/m   Physical Exam  Constitutional: She is oriented to person, place, and time. She appears well-developed and well-nourished.  HENT:  Mouth/Throat: Oropharynx is clear and moist.  Eyes: No scleral icterus.  Neck: Normal range of motion. Neck supple. No thyromegaly present.  Cardiovascular: Normal rate, regular rhythm and intact distal pulses.  Exam reveals no gallop and no friction rub.   No murmur heard. Pulmonary/Chest: No respiratory distress. She has no wheezes. She has no rales.  Abdominal: Soft. Bowel sounds are normal. She exhibits no distension and no mass. There is tenderness (mid-pelvic).  Bilateral CVA tenderness.  Musculoskeletal: She exhibits no edema.  Neurological: She is alert and oriented to person, place, and time.  Skin: Skin is warm and dry.   Results for orders placed or performed in visit on 02/03/16 (from the past 24 hour(s))  POCT urinalysis dipstick     Status: Abnormal   Collection Time: 02/03/16  2:39 PM  Result Value Ref Range   Color, UA orange (A) yellow   Clarity, UA clear clear   Glucose, UA =250 (A) negative   Bilirubin, UA small (A) negative   Ketones, POC UA trace (5) (A) negative   Spec Grav, UA <=1.005    Blood, UA small (A) negative   pH, UA 5.0    Protein Ur, POC =100 (A) negative   Urobilinogen, UA 4.0    Nitrite, UA Positive (A) Negative   Leukocytes, UA large (3+) (A) Negative  POCT Microscopic Urinalysis ([facility])     Status: Abnormal   Collection Time: 02/03/16  2:45 PM  Result Value Ref Range   WBC,UR,HPF,POC Too numerous  to count  (A) None WBC/hpf   RBC,UR,HPF,POC Few (A) None RBC/hpf   Bacteria Few (A) None, Too numerous to count   Mucus Absent Absent   Epithelial Cells, UR Per Microscopy None None, Too numerous to count cells/hpf   ECG interpretation - Q-wave in Lead III and aVF. Sinus rhythm otherwise.  Assessment and Plan :   1. Acute pyelonephritis 2. Dysuria - Will treat for pyelonephritis. IM ceftriaxone  today, cipro at home. Urine culture is pending. Recheck in 3 days if no improvement or sooner if worsening symptoms. Patient requested refill of medications previously prescribed by urologist. However, I recommended that she f/u in 2 weeks for this.   3. Glucosuria - A1c pending  4. Essential hypertension 5. Atypical chest pain - Refilled blood pressure medication. Referred to Dr. Irven Shelling office tomorrow for evaluation given atypical chest pain, ecg findings. Discussed warning signs and symptoms warranting emergent treatment in the ED, patient verbalized understanding. She is to start baby aspirin tonight.  Jaynee Eagles, PA-C [facility] [phone] 02/03/2016 2:16 PM

## 2016-02-03 NOTE — Patient Instructions (Addendum)
Pielonefritis en los adultos (Pyelonephritis, Adult) La pielonefritis es una infeccin del rin. Los riones son los rganos que filtran la sangre y [employer] residuos del torrente sanguneo a travs de la orina. La orina pasa desde los riones, a travs de los urteres, Water engineer la vejiga. Hay dos tipos principales de pielonefritis:  Infecciones que se inician rpidamente sin sntomas previos (pielonefritis aguda).  Infecciones que persisten durante un perodo prolongado (pielonefritis crnica). En la [employer], la infeccin desaparece con el tratamiento y no causa otros problemas. Las infecciones ms graves o crnicas a veces pueden propagarse al torrente sanguneo u ocasionar otros problemas en los riones. CAUSAS Por lo general, entre las causas de esta afeccin, se incluyen las siguientes:  Bacterias que pasan desde la vejiga al rin a travs de la orina infectada. La orina de la vejiga puede infectarse por bacterias relacionadas con estas causas:  Infeccin en la vejiga (cistitis).  Inflamacin de la prstata (prostatitis).  Relaciones sexuales en las mujeres.  Bacterias que pasan del torrente sanguneo al rin. FACTORES DE RIESGO Es ms probable que esta afeccin se manifieste en:  Las embarazadas.  Las personas de edad avanzada.  Los diabticos.  Las personas que tienen clculos en los riones o la vejiga.  Las personas que tienen otras anomalas en el rin o los urteres.  Las personas que tienen una sonda vesical.  Las personas con Hotel manager.  Las personas que son sexualmente activas.  Las mujeres que usan espermicidas.  Las personas que han tenido una infeccin previa en las vas [place]. SNTOMAS Los sntomas de esta afeccin incluyen lo siguiente:  Ganas frecuentes de Garment/textile technologist.  Necesidad intensa o persistente de Garment/textile technologist.  Sensacin de ardor o escozor al [employer].  Dolor abdominal.  Dolor de espalda.  Dolor al costado del cuerpo o en la  fosa lumbar.  Cristy Hilts.  Escalofros.  Sangre en la [place] u [place].  Nuseas.  Vmitos. DIAGNSTICO Esta afeccin se puede diagnosticar en funcin de lo siguiente:  Examen fsico e historia clnica.  Anlisis de [place].  Anlisis de [place]. Tambin pueden [employer] de diagnstico por imgenes de los riones, por [place], una ecografa o una tomografa computarizada. TRATAMIENTO El tratamiento de esta afeccin puede depender de la gravedad de la infeccin.  Si la infeccin es leve y se detecta rpidamente, pueden administrarle antibiticos por va oral. Deber tomar lquido para permanecer hidratado.  Si la infeccin es ms grave, es posible que deban hospitalizarlo para administrarle antibiticos directamente en una vena a travs de una va intravenosa (IV). Quizs tambin deban administrarle lquidos a travs de una va intravenosa si no se encuentra bien hidratado. Despus de la hospitalizacin, es posible que deba tomar antibiticos durante un [place]. Podrn prescribirle otros tratamientos segn la causa de la infeccin. [place] los medicamentos de venta libre y los recetados solamente como se lo haya indicado el mdico.  Si le recetaron un antibitico, tmelo como se lo haya indicado el mdico. No deje de tomar los antibiticos aunque comience a Sports administrator. Instrucciones generales  Beba suficiente lquido para mantener la orina clara o de color amarillo plido.  Evite la cafena, el t y las bebidas gaseosas. Estas sustancias irritan la vejiga.  Orine con frecuencia. Evite retener la orina durante largos perodos.  Orine antes y despus [place].  Despus de defecar, las mujeres deben higienizarse la regin perineal desde adelante hacia atrs. Use cada trozo de  papel higinico solo [employer].  Concurra a todas las visitas de control como se lo haya indicado el mdico. Esto es  importante. SOLICITE ATENCIN MDICA SI:  Los sntomas no mejoran despus de 2das de tratamiento.  Los sntomas empeoran.  Tiene fiebre. SOLICITE ATENCIN MDICA DE INMEDIATO SI:  No puede tomar los antibiticos ni ingerir lquidos.  Comienza a sentir escalofros.  Vomita.  Siente un dolor intenso en la espalda o en la fosa lumbar.  Se desmaya o siente una debilidad extrema.   Esta informacin no tiene Marine scientist el consejo del mdico. Asegrese de hacerle al mdico cualquier pregunta que tenga.   Document Released: 03/04/2005 Document Revised: 02/13/2015 Elsevier Interactive Patient Education 2016 [place] alimentos ricos en potasio son:  Clayburn Pert secos, como cacahuetes y pistachos.  Semillas, como semillas de girasol y de Scientific laboratory technician.  Porotos, guisantes secos y lentejas.  Granos enteros y panes y cereales con salvado.  Lambert Mody y vegetales frescos como damascos, avocado, bananas, meln, kiwi, naranjas, esprragos y patatas.  Jugos de naranja y tomates.  Carnes rojas.  Yogur con frutas.   Appointment at 1:00pm on 02/04/2016 Address: [address], [place], [place] [address]  Phone: [phone]   Hipertensin (Hypertension) La hipertensin, conocida comnmente como presin arterial alta, se produce cuando la sangre bombea en las arterias con mucha fuerza. Las arterias son los vasos sanguneos que transportan la sangre desde el corazn hacia todas las partes del cuerpo. Una lectura de la presin arterial consiste en un nmero ms alto sobre un nmero ms bajo, por ejemplo, 110/72. El nmero ms alto (presin sistlica) corresponde a la presin interna de las arterias cuando el corazn [place]. El nmero ms bajo (presin diastlica) corresponde a la presin interna de las arterias cuando el corazn se relaja. En condiciones ideales, la presin arterial debe ser inferior a 120/80. La hipertensin fuerza al corazn a trabajar ms para Air cabin crew. Las arterias pueden estrecharse o ponerse rgidas. La hipertensin no tratada o no controlada puede causar infarto de miocardio, ictus, enfermedad renal y otros problemas. [place] de riesgo de hipertensin son controlables, pero otros no lo son.  Entre los factores de riesgo que usted no puede Chief Technology Officer, se [employer] siguientes:   La raza. El riesgo es mayor para las Retail banker.  La edad. Los riesgos aumentan con la edad.  El sexo. Antes de los 45aos, los hombres corren ms [employer]. Despus de los 65aos, las mujeres corren ms [employer]. Entre los factores de riesgo que usted puede Chief Technology Officer, se [employer] siguientes:  No hacer la cantidad suficiente de actividad fsica o ejercicio.  Tener sobrepeso.  Consumir mucha grasa, azcar, caloras o sal en la dieta.  Beber alcohol en exceso. SIGNOS Y SNTOMAS Por lo general, la hipertensin no causa signos o sntomas. La hipertensin arterial demasiado alta (crisis hipertensiva) puede causar dolor de cabeza, ansiedad, falta de aire y hemorragia nasal. DIAGNSTICO Para detectar si usted tiene hipertensin, el mdico le medir la presin arterial mientras est sentado, con el brazo levantado a la altura del corazn. Debe medirla al [facility] veces en el mismo brazo. Determinadas condiciones pueden causar una diferencia de presin arterial entre el brazo izquierdo y Insurance underwriter. El hecho de tener una sola lectura de la presin arterial ms alta que lo normal no significa que Stage manager. Si no est claro si tiene hipertensin arterial, es posible que  se le pida que regrese Administrator, arts para volver a controlarle la presin arterial. O bien se le puede pedir que se controle la presin arterial en su casa durante 1 o ms meses. [place] hipertensin arterial incluye hacer cambios en el estilo de vida y, posiblemente, tomar medicamentos.  Un estilo de vida saludable puede ayudar a bajar la presin arterial alta. Quiz deba cambiar algunos hbitos. Los cambios en el estilo de vida pueden incluir lo siguiente:  Seguir la dieta DASH. Esta dieta tiene un alto contenido de frutas, verduras y Psychologist, prison and probation services. Incluye poca cantidad de sal, carnes rojas y azcares agregados.  Mantenga el consumo de sodio por debajo de 2300 mg por da.  Realizar al [employer] 30 y 82 minutos de ejercicio [place], 4 veces por semana como mnimo.  Perder peso, si es necesario.  No fumar.  Limitar el consumo de bebidas alcohlicas.  Aprender formas de reducir el estrs. El mdico puede recetarle medicamentos si los cambios en el estilo de vida no son suficientes para Child psychotherapist la presin arterial y si una de las siguientes afirmaciones es verdadera:  Fidel Levy 82 y 27 aos y su presin arterial sistlica est por encima de 140.  Tiene 60 aos o ms y su presin arterial sistlica est por encima de 150.  Su presin arterial diastlica est por encima de 90.  Tiene diabetes y su presin arterial sistlica est por encima de 140 o su presin arterial diastlica est por encima de 90.  Tiene una enfermedad renal y su presin arterial est por encima de 140/90.  Tiene una enfermedad cardaca y su presin arterial est por encima de 140/90. La presin arterial deseada puede variar en funcin de las enfermedades, la edad y otros factores personales. INSTRUCCIONES PARA EL CUIDADO EN EL HOGAR  Haga que le midan de nuevo la presin arterial segn las indicaciones del [place] los medicamentos solamente como se lo haya indicado el mdico. Siga cuidadosamente las indicaciones. Los medicamentos para la presin arterial deben tomarse segn las indicaciones. Los medicamentos pierden eficacia al omitir las dosis. El hecho de omitir las dosis tambin [place] el riesgo de otros problemas.  No fume.  Contrlese la presin arterial en su  casa segn las indicaciones del mdico. SOLICITE ATENCIN MDICA SI:   Piensa que tiene una reaccin alrgica a los medicamentos.  Tiene mareos o dolores de cabeza con Scientist, research (physical sciences).  Tiene hinchazn en los tobillos.  Tiene problemas de visin. SOLICITE ATENCIN MDICA DE INMEDIATO SI:  Siente un dolor de cabeza intenso o confusin.  Siente debilidad inusual, adormecimiento o que Geneticist, molecular.  Siente dolor intenso en el pecho o en el abdomen.  Vomita repetidas veces.  Tiene dificultad para respirar. ASEGRESE DE QUE:   Comprende estas instrucciones.  Controlar su afeccin.  Recibir ayuda de inmediato si no mejora o si empeora.   Esta informacin no tiene Marine scientist el consejo del mdico. Asegrese de hacerle al mdico cualquier pregunta que tenga.   Document Released: 05/25/2005 Document Revised: 10/09/2014 Elsevier Interactive Patient Education [employer].     IF you received an x-ray today, you will receive an invoice from [facility]. Please contact [facility] at [phone] with questions or concerns regarding your invoice.   IF you received labwork today, you will receive an invoice from [facility]. Please contact [facility] at [phone] with questions or concerns regarding your invoice.   Our billing staff will not be  able to assist you with questions regarding bills from these companies.  You will be contacted with the lab results as soon as they are available. The fastest way to get your results is to activate your My Chart account. Instructions are located on the last page of this paperwork. If you have not heard from [place] regarding the results in 2 weeks, please contact this office.

## 2016-02-04 LAB — HEMOGLOBIN A1C
HEMOGLOBIN A1C: 5.5 % (ref <5.7)
MEAN PLASMA GLUCOSE: 111 mg/dL

## 2016-02-05 LAB — URINE CULTURE

## 2016-02-07 ENCOUNTER — Other Ambulatory Visit: Payer: Self-pay | Admitting: Urgent Care

## 2016-02-07 MED ORDER — PENTOSAN POLYSULFATE SODIUM 100 MG PO CAPS: 100.0000 mg | ORAL_CAPSULE | Freq: Three times a day (TID) | ORAL | 1 refills | Status: DC

## 2016-02-17 ENCOUNTER — Telehealth (HOSPITAL_COMMUNITY): Payer: Self-pay | Admitting: *Deleted

## 2016-02-17 NOTE — Telephone Encounter (Signed)
Returned patient's call. Patient stated received bills. Advised patient would need to fax bills to [phone]. Patient concerned about when next pap smear is due. Advised patient next pap smear is due May 2018. Patient voiced understanding. Used interpreter Lavon Paganini.

## 2016-07-01 ENCOUNTER — Encounter (HOSPITAL_COMMUNITY): Payer: Self-pay | Admitting: *Deleted

## 2016-07-01 ENCOUNTER — Ambulatory Visit (INDEPENDENT_AMBULATORY_CARE_PROVIDER_SITE_OTHER): Payer: Self-pay | Admitting: Emergency Medicine

## 2016-07-01 VITALS — BP 128/80 | HR 66 | Temp 98.5°F | Resp 16 | Ht 62.0 in | Wt 150.0 lb

## 2016-07-01 DIAGNOSIS — R531 Weakness: Secondary | ICD-10-CM

## 2016-07-01 DIAGNOSIS — E785 Hyperlipidemia, unspecified: Secondary | ICD-10-CM

## 2016-07-01 MED ORDER — FENOFIBRATE 145 MG PO TABS: 145.0000 mg | ORAL_TABLET | Freq: Every day | ORAL | 3 refills | Status: DC

## 2016-07-01 NOTE — Patient Instructions (Addendum)
     IF you received an x-ray today, you will receive an invoice from [facility]. Please contact [facility] at [phone] with questions or concerns regarding your invoice.   IF you received labwork today, you will receive an invoice from [facility]. Please contact [facility] at [phone] with questions or concerns regarding your invoice.   Our billing staff will not be able to assist you with questions regarding bills from these companies.  You will be contacted with the lab results as soon as they are available. The fastest way to get your results is to activate your My Chart account. Instructions are located on the last page of this paperwork. If you have not heard from us regarding the results in 2 weeks, please contact this office.     Debilidad (Weakness) La debilidad es la falta de energa. Puede sentir debilidad en todo el cuerpo (generalizada) o puede sentir debilidad en una parte especfica del cuerpo (focal). Hay muchas causas posibles de debilidad. A veces, la causa de la debilidad puede ser desconocida. Algunas causas de debilidad pueden ser graves, por lo que es importante que consulte a un mdico. CUIDADOS EN EL HOGAR  Descanse todo lo que sea necesario.  Trate de dormir lo suficiente. Hable con el mdico acerca de cunto debe dormir cada noche.  Tome los medicamentos de venta libre y los recetados solamente como se lo haya indicado el mdico.  Consuma una dieta sana y bien equilibrada. Esto incluye lo siguiente:  Protenas para fortalecer los msculos, como carne magra y pescado.  Frutas y verduras frescas.  Carbohidratos para tener ms energa, como cereales integrales.  Beba suficiente lquido para mantener el pis (orina) claro o de color amarillo plido.  Haga ejercicios de fuerza, como flexiones de brazos y elevaciones de las piernas, durante 30minutos al menos dos veces por semana o como se lo haya indicado el mdico.  Piense en consultar  a un fisioterapeuta o entrenador para que lo ayude a estar ms fuerte.  Concurra a todas las visitas de control como se lo haya indicado el mdico. Esto es importante. SOLICITE AYUDA SI:  La debilidad no mejora o empeora.  La debilidad afecta su capacidad para hacer lo siguiente:  Pensar con claridad.  Realizar sus actividades habituales. SOLICITE AYUDA DE INMEDIATO SI:  Siente debilidad repentina.  Tiene dificultad para respirar o le falta el aire.  Tiene problemas visuales.  Tiene dificultad para hablar o tragar.  Tiene dificultad para pararse o caminar.  Siente dolor en el pecho.  Se siente mareado.  Se desmaya (pierde el conocimiento). Esta informacin no tiene como fin reemplazar el consejo del mdico. Asegrese de hacerle al mdico cualquier pregunta que tenga. Document Released: 08/21/2008 Document Revised: 11/24/2011 Document Reviewed: 03/15/2015 Elsevier Interactive Patient Education  2017 Elsevier Inc.  

## 2016-07-01 NOTE — Progress Notes (Signed)
[name] [name] 51 y.o.   Chief Complaint  Patient presents with  . Labs Only    Glucose and Cholesterol check. Pt fasting  . Medication Refill    Fenofibrate    HISTORY OF PRESENT ILLNESS: This is a 51 y.o. female complaining of general weakness and lack of sleep for several months/years.  HPI   Prior to Admission medications   Medication Sig Start Date End Date Taking? Authorizing Provider  amLODipine (NORVASC) 5 MG tablet Take 1 tablet (5 mg total) by mouth daily. 02/03/16  Yes Jaynee Eagles, PA-C  fenofibrate (TRICOR) 145 MG tablet Take 1 tablet (145 mg total) by mouth daily. 07/01/16  Yes Peightyn Roberson Joellen Jersey, MD  losartan-hydrochlorothiazide (HYZAAR) 50-12.5 MG tablet Take 1 tablet by mouth daily. 02/03/16  Yes Jaynee Eagles, PA-C    No Known Allergies  Patient Active Problem List   Diagnosis Date Noted  . Dysplasia of cervix, low grade (CIN 1) 10/30/2015  . Essential hypertension 03/06/2015  . Plantar fasciitis of right foot 10/05/2013  . Breast pain, left 11/15/2012  . Essential hypertension, benign 07/25/2012  . Varicose veins 07/07/2011    Past Medical History:  Diagnosis Date  . Acid reflux   . Endometriosis   . Gastritis   . Hypertension   . Migraine   . Varicose veins     Past Surgical History:  Procedure Laterality Date  . CESAREAN SECTION     She had 5 C-Sections    Social History   Social History  . Marital status: Married    Spouse name: N/A  . Number of children: N/A  . Years of education: N/A   Occupational History  . Not on file.   Social History Main Topics  . Smoking status: Never Smoker  . Smokeless tobacco: Never Used  . Alcohol use No  . Drug use: No  . Sexual activity: Yes    Birth control/ protection: None   Other Topics Concern  . Not on file   Social History Narrative  . No narrative on file    Family History  Problem Relation Age of Onset  . Stroke Father   . Hypertension Father   . Hypertension Mother   .  Thyroid disease Mother      Review of Systems  Constitutional: Positive for malaise/fatigue. Negative for chills, fever and weight loss.  HENT: Negative.  Negative for congestion, nosebleeds, sinus pain and sore throat.   Eyes: Negative.  Negative for blurred vision, discharge and redness.  Respiratory: Negative.  Negative for cough, hemoptysis and shortness of breath.   Cardiovascular: Negative.  Negative for chest pain, palpitations, claudication, leg swelling and PND.  Gastrointestinal: Negative.  Negative for abdominal pain, blood in stool, diarrhea, melena, nausea and vomiting.  Genitourinary: Negative.  Negative for dysuria and hematuria.  Musculoskeletal: Negative.  Negative for back pain, joint pain, myalgias and neck pain.  Skin: Negative for rash.  Neurological: Positive for weakness. Negative for dizziness, tingling, sensory change, speech change, focal weakness, seizures and headaches.       Forgetfullness  Endo/Heme/Allergies: Negative.   Psychiatric/Behavioral: Negative for depression and suicidal ideas. The patient has insomnia. The patient is not nervous/anxious.   All other systems reviewed and are negative.  Vitals:   07/01/16 1029 07/01/16 1119  BP: (!) 144/84 128/80  Pulse: 66   Resp: 16   Temp: 98.5 F (36.9 C)     Physical Exam  Constitutional: She is oriented to person, place, and time. She  appears well-developed and well-nourished.  HENT:  Head: Normocephalic and atraumatic.  Right Ear: External ear normal.  Left Ear: External ear normal.  Nose: Nose normal.  Mouth/Throat: Oropharynx is clear and moist. No oropharyngeal exudate.  Eyes: Conjunctivae and EOM are normal. Pupils are equal, round, and reactive to light.  Neck: Normal range of motion. Neck supple. No JVD present. No thyromegaly present.  Cardiovascular: Normal rate, regular rhythm, normal heart sounds and intact distal pulses.   Pulmonary/Chest: Effort normal and breath sounds normal.    Abdominal: Soft. Bowel sounds are normal. She exhibits no distension and no mass. There is no tenderness.  Musculoskeletal: Normal range of motion.  Extremities: WNL, FROM, NVI  Lymphadenopathy:    She has no cervical adenopathy.  Neurological: She is alert and oriented to person, place, and time. She displays normal reflexes. No sensory deficit.  Skin: Skin is warm and dry. Capillary refill takes less than 2 seconds.  Psychiatric: She has a normal mood and affect. Her behavior is normal.  Vitals reviewed.    ASSESSMENT & PLAN: Zayli was seen today for labs only and medication refill.  Diagnoses and all orders for this visit:  General weakness -     CBC with Differential/Platelet -     Comprehensive metabolic panel -     Hemoglobin A1c -     Lipid panel -     TSH  Hyperlipidemia, unspecified hyperlipidemia type -     fenofibrate (TRICOR) 145 MG tablet; Take 1 tablet (145 mg total) by mouth daily.    Patient Instructions       IF you received an x-ray today, you will receive an invoice from [facility]. Please contact [facility] at [phone] with questions or concerns regarding your invoice.   IF you received labwork today, you will receive an invoice from [place]. Please contact [facility] at [phone] with questions or concerns regarding your invoice.   Our billing staff will not be able to assist you with questions regarding bills from these companies.  You will be contacted with the lab results as soon as they are available. The fastest way to get your results is to activate your My Chart account. Instructions are located on the last page of this paperwork. If you have not heard from [place] regarding the results in 2 weeks, please contact this office.     Debilidad (Weakness) La debilidad es la falta de energa. Puede sentir debilidad en todo el cuerpo (generalizada) o puede sentir debilidad en una parte especfica del cuerpo (focal). Hay muchas  causas posibles de debilidad. A veces, la causa de la debilidad puede ser desconocida. Algunas causas de debilidad pueden ser graves, por lo que es importante que consulte a un mdico. CUIDADOS EN EL HOGAR  Descanse todo lo que sea necesario.  Trate de dormir lo suficiente. Hable con el mdico acerca de cunto debe dormir cada noche.  [employer] medicamentos de venta libre y los recetados solamente como se lo haya indicado el mdico.  Consuma una dieta sana y [place]. Esto incluye lo siguiente:  Protenas para fortalecer los msculos, como carne [place] y pescado.  Lambert Mody y verduras frescas.  Carbohidratos para tener ms energa, como cereales integrales.  Beba suficiente lquido para mantener el pis (orina) claro o de color amarillo plido.  Haga ejercicios de fuerza, como flexiones de brazos y Armed forces logistics/support/administrative officer de las piernas, durante 20minutos al [employer] veces por semana o como se lo haya indicado el mdico.  [place]  en consultar a un fisioterapeuta o entrenador para que lo ayude a estar ms fuerte.  Concurra a todas las visitas de control como se lo haya indicado el mdico. Esto es importante. SOLICITE AYUDA SI:  La debilidad no mejora o empeora.  La debilidad afecta su capacidad para hacer lo siguiente:  Pensar con claridad.  Realizar sus actividades habituales. SOLICITE AYUDA DE INMEDIATO SI:  Siente debilidad repentina.  Tiene dificultad para respirar o Risk manager.  Tiene problemas visuales.  Tiene dificultad para hablar o tragar.  Tiene dificultad para pararse o caminar.  Siente dolor en el pecho.  Se siente mareado.  Se desmaya (pierde el conocimiento). Esta informacin no tiene Marine scientist el consejo del mdico. Asegrese de hacerle al mdico cualquier pregunta que tenga. Document Released: 08/21/2008 Document Revised: 11/24/2011 Document Reviewed: 03/15/2015 Elsevier Interactive Patient Education  2017 Elsevier Inc.      Agustina Caroli, MD [facility]

## 2016-07-02 LAB — COMPREHENSIVE METABOLIC PANEL
A/G RATIO: 1.4 (ref 1.2–2.2)
ALK PHOS: 129 IU/L — AB (ref 39–117)
ALT: 20 IU/L (ref 0–32)
AST: 24 IU/L (ref 0–40)
Albumin: 4.4 g/dL (ref 3.5–5.5)
BILIRUBIN TOTAL: 0.5 mg/dL (ref 0.0–1.2)
BUN/Creatinine Ratio: 15 (ref 9–23)
BUN: 9 mg/dL (ref 6–24)
CALCIUM: 9.7 mg/dL (ref 8.7–10.2)
CHLORIDE: 103 mmol/L (ref 96–106)
CO2: 24 mmol/L (ref 18–29)
Creatinine, Ser: 0.6 mg/dL (ref 0.57–1.00)
GFR calc Af Amer: 124 mL/min/{1.73_m2} (ref >59)
GFR calc non Af Amer: 107 mL/min/{1.73_m2} (ref >59)
Globulin, Total: 3.2 g/dL (ref 1.5–4.5)
Glucose: 82 mg/dL (ref 65–99)
POTASSIUM: 4.5 mmol/L (ref 3.5–5.2)
Sodium: 143 mmol/L (ref 134–144)
Total Protein: 7.6 g/dL (ref 6.0–8.5)

## 2016-07-02 LAB — CBC WITH DIFFERENTIAL/PLATELET
BASOS: 0 %
Basophils Absolute: 0 10*3/uL (ref 0.0–0.2)
EOS (ABSOLUTE): 0.1 10*3/uL (ref 0.0–0.4)
Eos: 2 %
Hematocrit: 40.8 % (ref 34.0–46.6)
Hemoglobin: 13.5 g/dL (ref 11.1–15.9)
IMMATURE GRANULOCYTES: 0 %
Immature Grans (Abs): 0 10*3/uL (ref 0.0–0.1)
Lymphocytes Absolute: 1.5 10*3/uL (ref 0.7–3.1)
Lymphs: 30 %
MCH: 27.2 pg (ref 26.6–33.0)
MCHC: 33.1 g/dL (ref 31.5–35.7)
MCV: 82 fL (ref 79–97)
MONOS ABS: 0.4 10*3/uL (ref 0.1–0.9)
Monocytes: 9 %
NEUTROS PCT: 59 %
Neutrophils Absolute: 3 10*3/uL (ref 1.4–7.0)
PLATELETS: 296 10*3/uL (ref 150–379)
RBC: 4.97 x10E6/uL (ref 3.77–5.28)
RDW: 13.8 % (ref 12.3–15.4)
WBC: 5.1 10*3/uL (ref 3.4–10.8)

## 2016-07-02 LAB — LIPID PANEL
CHOL/HDL RATIO: 4 ratio (ref 0.0–4.4)
CHOLESTEROL TOTAL: 137 mg/dL (ref 100–199)
HDL: 34 mg/dL — ABNORMAL LOW (ref >39)
LDL CALC: 65 mg/dL (ref 0–99)
Triglycerides: 190 mg/dL — ABNORMAL HIGH (ref 0–149)
VLDL CHOLESTEROL CAL: 38 mg/dL (ref 5–40)

## 2016-07-02 LAB — HEMOGLOBIN A1C
ESTIMATED AVERAGE GLUCOSE: 120 mg/dL
Hgb A1c MFr Bld: 5.8 % — ABNORMAL HIGH (ref 4.8–5.6)

## 2016-07-02 LAB — TSH: TSH: 3 u[IU]/mL (ref 0.450–4.500)

## 2016-07-16 DIAGNOSIS — R079 Chest pain, unspecified: Secondary | ICD-10-CM | POA: Insufficient documentation

## 2016-09-14 ENCOUNTER — Other Ambulatory Visit: Payer: Self-pay

## 2016-09-21 LAB — CYTOLOGY - PAP: Diagnosis: NEGATIVE

## 2016-12-29 ENCOUNTER — Encounter (HOSPITAL_COMMUNITY): Payer: Self-pay

## 2017-02-16 ENCOUNTER — Ambulatory Visit (HOSPITAL_COMMUNITY): Payer: Self-pay

## 2017-04-23 ENCOUNTER — Other Ambulatory Visit (HOSPITAL_COMMUNITY): Payer: Self-pay | Admitting: *Deleted

## 2017-04-23 DIAGNOSIS — N644 Mastodynia: Secondary | ICD-10-CM

## 2017-05-27 ENCOUNTER — Other Ambulatory Visit: Payer: Self-pay

## 2017-05-27 ENCOUNTER — Ambulatory Visit (HOSPITAL_COMMUNITY): Payer: Self-pay

## 2017-06-17 ENCOUNTER — Encounter: Payer: Self-pay | Admitting: Family Medicine

## 2017-06-17 ENCOUNTER — Ambulatory Visit: Payer: Self-pay | Admitting: Family Medicine

## 2017-06-17 ENCOUNTER — Other Ambulatory Visit: Payer: Self-pay

## 2017-06-17 VITALS — BP 164/84 | HR 93 | Temp 98.0°F | Ht 64.17 in | Wt 146.0 lb

## 2017-06-17 DIAGNOSIS — R309 Painful micturition, unspecified: Secondary | ICD-10-CM

## 2017-06-17 DIAGNOSIS — G4700 Insomnia, unspecified: Secondary | ICD-10-CM

## 2017-06-17 DIAGNOSIS — R079 Chest pain, unspecified: Secondary | ICD-10-CM

## 2017-06-17 DIAGNOSIS — I10 Essential (primary) hypertension: Secondary | ICD-10-CM

## 2017-06-17 LAB — POCT URINALYSIS DIP (MANUAL ENTRY)
Bilirubin, UA: NEGATIVE
Blood, UA: NEGATIVE
Glucose, UA: NEGATIVE mg/dL
Ketones, POC UA: NEGATIVE mg/dL
Leukocytes, UA: NEGATIVE
Nitrite, UA: NEGATIVE
Protein Ur, POC: NEGATIVE mg/dL
Spec Grav, UA: 1.025 (ref 1.010–1.025)
Urobilinogen, UA: 0.2 E.U./dL
pH, UA: 6.5 (ref 5.0–8.0)

## 2017-06-17 MED ORDER — LOSARTAN POTASSIUM-HCTZ 50-12.5 MG PO TABS: 1.0000 | ORAL_TABLET | Freq: Every day | ORAL | 3 refills | Status: DC

## 2017-06-17 MED ORDER — TRAZODONE HCL 50 MG PO TABS: 25.0000 mg | ORAL_TABLET | Freq: Every evening | ORAL | 3 refills | Status: DC | PRN

## 2017-06-17 NOTE — Progress Notes (Signed)
1/10/20192:48 PM  [name] [name] 06-Jan-1966, 52 y.o. female [id number]  Chief Complaint  Patient presents with  . Headache     has pain when urinating and problem sleeping  . Medication Refill    HPI:   Patient is a 52 y.o. female with past medical history significant for HTN who presents today for medication refill. She has been out of medications for several months. Saw a provider in [place] during the holidays and was prescribed atorvastatin 20mg /zetia 10mg  for her blood pressure. Patient reports that her BP has been elevated for a while. She has had a daily headache for most of this time. Denies any vision, speech or motor changes. She denies any chest pain or SOB. She was referred last year to cards after experiencing an episode of waking up in the middle of the night with significant SOB, chest heaviness and left arm numbness. She did not go to the ER. She was evaluated in clinica about a week later, EKG was found to have q waves in inferior leads.   Otherwise requesting refill of flexeril which she takes for insomnia. Has never tried anything else.  Patient is also wondering about a UTI as she has had 2-3 days of burning with urination, no blood, no n/v/f/c. Denies any vaginal discharge or pelvic pain.  Depression screen [facility] 2/9 06/17/2017 02/03/2016 03/06/2015  Decreased Interest 0 0 0  Down, Depressed, Hopeless 0 0 0  PHQ - 2 Score 0 0 0    No Known Allergies  Prior to Admission medications   Medication Sig Start Date End Date Taking? Authorizing Provider  amLODipine (NORVASC) 5 MG tablet Take 1 tablet (5 mg total) by mouth daily. 02/03/16  Yes Jaynee Eagles, PA-C  fenofibrate (TRICOR) 145 MG tablet Take 1 tablet (145 mg total) by mouth daily. 07/01/16  Yes Sagardia, Ines Bloomer, MD  losartan-hydrochlorothiazide (HYZAAR) 50-12.5 MG tablet Take 1 tablet by mouth daily. 02/03/16  Yes Jaynee Eagles, PA-C    Past Medical History:  Diagnosis Date  . Acid reflux   . Endometriosis    . Gastritis   . Hypertension   . Migraine   . Varicose veins     Past Surgical History:  Procedure Laterality Date  . CESAREAN SECTION     She had 5 C-Sections    Social History   Tobacco Use  . Smoking status: Never Smoker  . Smokeless tobacco: Never Used  Substance Use Topics  . Alcohol use: No    Alcohol/week: 0.0 oz    Family History  Problem Relation Age of Onset  . Stroke Father   . Hypertension Father   . Hypertension Mother   . Thyroid disease Mother     Review of Systems  Constitutional: Negative for chills and fever.  Respiratory: Negative for cough and shortness of breath.   Cardiovascular: Negative for chest pain, palpitations and leg swelling.  Gastrointestinal: Negative for abdominal pain, nausea and vomiting.  Psychiatric/Behavioral: The patient has insomnia.     OBJECTIVE:  Blood pressure (!) 164/84, pulse 93, temperature 98 F (36.7 C), height 5' 4.17" (1.63 m), weight 146 lb (66.2 kg), last menstrual period 05/12/2012.  Physical Exam  Constitutional: She is oriented to person, place, and time and well-developed, well-nourished, and in no distress.  HENT:  Head: Normocephalic and atraumatic.  Mouth/Throat: Oropharynx is clear and moist. No oropharyngeal exudate.  Eyes: EOM are normal. Pupils are equal, round, and reactive to light. No scleral icterus.  Neck: Neck supple.  Cardiovascular: Normal rate, regular rhythm and normal heart sounds. Exam reveals no gallop and no friction rub.  No murmur heard. Pulmonary/Chest: Effort normal and breath sounds normal. She has no wheezes. She has no rales.  Musculoskeletal: She exhibits no edema.  Neurological: She is alert and oriented to person, place, and time. Gait normal.  Skin: Skin is warm and dry.      Results for orders placed or performed in visit on 06/17/17 (from the past 24 hour(s))  POCT urinalysis dipstick     Status: None   Collection Time: 06/17/17  2:51 PM  Result Value Ref Range     Color, UA yellow yellow   Clarity, UA clear clear   Glucose, UA negative negative mg/dL   Bilirubin, UA negative negative   Ketones, POC UA negative negative mg/dL   Spec Grav, UA 1.025 1.010 - 1.025   Blood, UA negative negative   pH, UA 6.5 5.0 - 8.0   Protein Ur, POC negative negative mg/dL   Urobilinogen, UA 0.2 0.2 or 1.0 E.U./dL   Nitrite, UA Negative Negative   Leukocytes, UA Negative Negative    ASSESSMENT and PLAN  1. Essential hypertension, benign Above goal, not on meds. Restarting with 2 meds, will titrate as needed. ER  Precautions reviewed. - losartan-hydrochlorothiazide (HYZAAR) 50-12.5 MG tablet; Take 1 tablet by mouth daily. - CBC - Comprehensive metabolic panel - TSH - Lipid panel  2. Chest pain, unspecified type Patient referred again based on previous event with abnormal ekg. - Ambulatory referral to Cardiology  3. Insomnia, unspecified type Will do trial of trazodone for insomnia. Discussed new med r/se/b.  - traZODone (DESYREL) 50 MG tablet; Take 0.5-1 tablets (25-50 mg total) by mouth at bedtime as needed for sleep.  4. Urination pain U/A normal, discussed pushing fluids. - POCT urinalysis dipstick    Return in about 3 months (around 09/15/2017).    Rutherford Guys, MD Primary Care at [address] [place], [place] [address] Ph.  [phone] Fax [phone]

## 2017-06-17 NOTE — Patient Instructions (Signed)
     IF you received an x-ray today, you will receive an invoice from [facility]. Please contact [facility] at [phone] with questions or concerns regarding your invoice.   IF you received labwork today, you will receive an invoice from [facility]. Please contact [facility] at [phone] with questions or concerns regarding your invoice.   Our billing staff will not be able to assist you with questions regarding bills from these companies.  You will be contacted with the lab results as soon as they are available. The fastest way to get your results is to activate your My Chart account. Instructions are located on the last page of this paperwork. If you have not heard from us regarding the results in 2 weeks, please contact this office.     

## 2017-06-18 ENCOUNTER — Encounter: Payer: Self-pay | Admitting: Family Medicine

## 2017-06-18 LAB — COMPREHENSIVE METABOLIC PANEL
ALT: 22 IU/L (ref 0–32)
AST: 21 IU/L (ref 0–40)
Albumin/Globulin Ratio: 1.6 (ref 1.2–2.2)
Albumin: 4.6 g/dL (ref 3.5–5.5)
Alkaline Phosphatase: 110 IU/L (ref 39–117)
BUN/Creatinine Ratio: 18 (ref 9–23)
BUN: 12 mg/dL (ref 6–24)
Bilirubin Total: 0.2 mg/dL (ref 0.0–1.2)
CO2: 23 mmol/L (ref 20–29)
Calcium: 9.5 mg/dL (ref 8.7–10.2)
Chloride: 104 mmol/L (ref 96–106)
Creatinine, Ser: 0.65 mg/dL (ref 0.57–1.00)
GFR calc Af Amer: 120 mL/min/{1.73_m2} (ref >59)
GFR calc non Af Amer: 104 mL/min/{1.73_m2} (ref >59)
Globulin, Total: 2.9 g/dL (ref 1.5–4.5)
Glucose: 98 mg/dL (ref 65–99)
Potassium: 4.2 mmol/L (ref 3.5–5.2)
Sodium: 143 mmol/L (ref 134–144)
Total Protein: 7.5 g/dL (ref 6.0–8.5)

## 2017-06-18 LAB — CBC
Hematocrit: 39.8 % (ref 34.0–46.6)
Hemoglobin: 13.8 g/dL (ref 11.1–15.9)
MCH: 28 pg (ref 26.6–33.0)
MCHC: 34.7 g/dL (ref 31.5–35.7)
MCV: 81 fL (ref 79–97)
Platelets: 304 10*3/uL (ref 150–379)
RBC: 4.93 x10E6/uL (ref 3.77–5.28)
RDW: 13.4 % (ref 12.3–15.4)
WBC: 5 10*3/uL (ref 3.4–10.8)

## 2017-06-18 LAB — LIPID PANEL
Chol/HDL Ratio: 2.8 ratio (ref 0.0–4.4)
Cholesterol, Total: 96 mg/dL — ABNORMAL LOW (ref 100–199)
HDL: 34 mg/dL — ABNORMAL LOW (ref >39)
LDL Calculated: 18 mg/dL (ref 0–99)
Triglycerides: 220 mg/dL — ABNORMAL HIGH (ref 0–149)
VLDL Cholesterol Cal: 44 mg/dL — ABNORMAL HIGH (ref 5–40)

## 2017-06-18 LAB — TSH: TSH: 2.57 u[IU]/mL (ref 0.450–4.500)

## 2017-06-24 ENCOUNTER — Ambulatory Visit (HOSPITAL_COMMUNITY)
Admission: RE | Admit: 2017-06-24 | Discharge: 2017-06-24 | Disposition: A | Discharge status: Home / Self Care | Payer: Self-pay | Source: Ambulatory Visit | Attending: Obstetrics and Gynecology | Admitting: Obstetrics and Gynecology

## 2017-06-24 ENCOUNTER — Other Ambulatory Visit: Payer: Self-pay

## 2017-06-24 ENCOUNTER — Encounter (HOSPITAL_COMMUNITY): Payer: Self-pay

## 2017-06-24 VITALS — BP 124/82 | Ht 62.0 in | Wt 148.6 lb

## 2017-06-24 DIAGNOSIS — Z1239 Encounter for other screening for malignant neoplasm of breast: Secondary | ICD-10-CM

## 2017-06-24 DIAGNOSIS — N644 Mastodynia: Secondary | ICD-10-CM

## 2017-06-24 NOTE — Progress Notes (Addendum)
Complaints of left breast pain that comes and goes. Patient rates the pain at a 5 out of 10. Patient complained of a white to yellowish bilateral breast discharge x 5 years when she expresses.   Pap Smear: Pap smear not completed today. Last Pap smear was 09/14/2016 at the free cervical cancer screening sponsored by [facility] and normal. Patient has a history of an abnormal Pap smear 09/09/2015 in [facility] that was LGSIL and positive HPV. 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 Last two Pap smears and colposcopy results are in Epic.  Physical exam: Breasts Breasts symmetrical. No skin abnormalities bilateral breasts. No nipple retraction bilateral breasts. No nipple discharge bilateral breasts. Unable to express any nipple discharge on exam. No lymphadenopathy. No lumps palpated bilateral breasts. Complaints of left outer and lower breast tenderness on exam. Referred patient to [facility] for a diagnostic mammogram and possible left breast ultrasound. Appointment scheduled for Wednesday, June 30, 2017 at [place].         Pelvic/Bimanual No Pap smear completed today since last Pap smear was 09/14/2016. Pap smear not indicated per BCCCP guidelines.   Smoking History: Patient has never smoked.  Patient Navigation: Patient education provided. Access to services provided for patient through [facility] program. Spanish interpreter provided.  Colorectal Cancer Screening: Per patient has never had a colonoscopy completed. No complaints today. FIT Test given to patient to complete and return to BCCCP.  Used Spanish interpreter [employer] from [place].

## 2017-06-24 NOTE — Patient Instructions (Signed)
Explained breast self awareness with Lidie P Soller. Patient did not need a Pap smear today due to last Pap smear was 09/14/2016. Let patient know that her next Pap smear is due in April 2019 due to her history of an abnormal Pap smear. Referred patient to [facility] for a diagnostic mammogram and possible left breast ultrasound. Appointment scheduled for Wednesday, June 30, 2017 at [place]. Patient aware of appointment and will be there. Elleen P Min verbalized understanding.  Brannock, Arvil Chaco, RN 11:55 AM

## 2017-06-28 ENCOUNTER — Encounter (HOSPITAL_COMMUNITY): Payer: Self-pay | Admitting: *Deleted

## 2017-06-29 ENCOUNTER — Telehealth: Payer: Self-pay | Admitting: Family Medicine

## 2017-06-29 NOTE — Telephone Encounter (Signed)
Copied from [place] [id number]. Topic: Referral - Request >> Jun 29, 2017 12:04 PM Robina Ade, Helene Kelp D wrote: Reason for CRM: Patient called and she would like a new referral to be sent to [facility] in [place] to se Dr. Jenkins Rouge. Please call patient when this is done is it is possible for her to change doctor. Patient would like this done as soon as possible because she is not feeling well.

## 2017-07-01 NOTE — Telephone Encounter (Signed)
Routed to Referrals, to change and call pt

## 2017-07-01 NOTE — Telephone Encounter (Signed)
Switched referral to [facility] for Dr. Johnsie Cancel. Spoke with pt and she is aware.

## 2017-07-20 ENCOUNTER — Encounter (HOSPITAL_COMMUNITY): Payer: Self-pay | Admitting: *Deleted

## 2017-07-28 NOTE — Progress Notes (Signed)
Cardiology Office Note   Date:  08/06/2017   ID:  [name] [name], DOB 12-May-1966, MRN [id number]  PCP:  Rosetta Posner, MD  Cardiologist:   Jenkins Rouge, MD   No chief complaint on file.     History of Present Illness: [name] [name] is a 52 y.o. female who presents for consultation regarding chest pain Referred by [facility] Primary Care Dr Grant Fontana. History of HTN Had run out of medicines in January Given script for atorvastatin and Zetia in [place] Describes episode of dyspnea in middle of night last year with chest heaviness ? Abnormal ECG at that time with inferior Q waves Sleeps poorly and uses flexeril for insomnia Started on Hyzaar 50/12/5 mg and referred to cards for this event And abnormal ECG   She seems to have some anxiety She has no insurance and is concerned about cost of testing Had her grandson Alan 3 yo with her today  Non smoker no previous heart testing No asthma or chronic lung disease    Past Medical History:  Diagnosis Date  . Acid reflux   . Chest pain   . Endometriosis   . Gastritis   . Hypertension   . Migraine   . Varicose veins     Past Surgical History:  Procedure Laterality Date  . CESAREAN SECTION     She had 5 C-Sections     Current Outpatient Medications  Medication Sig Dispense Refill  . amitriptyline (ELAVIL) 25 MG tablet Take 1-2 tablets (25-50 mg total) by mouth at bedtime. 60 tablet 1  . losartan-hydrochlorothiazide (HYZAAR) 50-12.5 MG tablet Take 1 tablet by mouth daily. 90 tablet 3  . traMADol (ULTRAM) 50 MG tablet Take 50 mg by mouth every 6 (six) hours as needed for moderate pain.     No current facility-administered medications for this visit.     Allergies:   Patient has no known allergies.    Social History:  The patient  reports that  has never smoked. she has never used smokeless tobacco. She reports that she does not drink alcohol or use drugs.   Family History:  The patient's family history includes  Diabetes in her father; Hypertension in her father and mother; Stroke in her father; Thyroid disease in her mother.    ROS:  Please see the history of present illness.   Otherwise, review of systems are positive for none.   All other systems are reviewed and negative.    PHYSICAL EXAM: VS:  BP 140/84   Pulse 87   Ht 5\' 2"  (1.575 m)   Wt 150 lb (68 kg)   LMP 05/12/2012   SpO2 99%   BMI 27.44 kg/m  , BMI Body mass index is 27.44 kg/m. Affect appropriate Healthy:  appears stated age 53: normal Neck supple with no adenopathy JVP normal no bruits no thyromegaly Lungs clear with no wheezing and good diaphragmatic motion Heart:  S1/S2 no murmur, no rub, gallop or click PMI normal Abdomen: benighn, BS positve, no tenderness, no AAA no bruit.  No HSM or HJR Distal pulses intact with no bruits No edema Neuro non-focal Skin warm and dry No muscular weakness     EKG:  SR rate 88 Q waves in 3, F low voltage normal T waves 08/06/17  SR rate 82 LAE Q wave 3,F    Recent Labs: 06/17/2017: ALT 22; BUN 12; Creatinine, Ser 0.65; Hemoglobin 13.8; Platelets 304; Potassium 4.2; Sodium 143; TSH 2.570  Lipid Panel    Component Value Date/Time   CHOL 96 (L) 06/17/2017 1536   TRIG 220 (H) 06/17/2017 1536   HDL 34 (L) 06/17/2017 1536   CHOLHDL 2.8 06/17/2017 1536   CHOLHDL 6.8 (H) 03/06/2015 1535   VLDL NOT CALC 03/06/2015 1535   LDLCALC 18 06/17/2017 1536      Wt Readings from Last 3 Encounters:  08/06/17 150 lb (68 kg)  08/05/17 149 lb 3.2 oz (67.7 kg)  06/24/17 148 lb 9.6 oz (67.4 kg)      Other studies Reviewed: Additional studies/ records that were reviewed today include: Notes from primary 06/17/17  ECG  labs .    ASSESSMENT AND PLAN:  1.  Dyspnea/Chest Pain seems atypical since she is concerned about money can try to answer both questions With a stress echo  2. HTN Well controlled.  Continue current medications and low sodium Dash type diet.   3. HLD continue statin  labs with primary  4. Abnormal ECG Insignificant q waves inferiorly and nonspecific ST changes evaluation see #1   Current medicines are reviewed at length with the patient today.  The patient does not have concerns regarding medicines.  The following changes have been made:  no change  Labs/ tests ordered today include: stress echo  No orders of the defined types were placed in this encounter.    Disposition:   FU with cardiology PRN      Signed, Jenkins Rouge, MD  08/06/2017 9:15 AM    [address], [place], [place]  [address] Phone: [phone]; Fax: [phone]

## 2017-07-29 ENCOUNTER — Encounter: Payer: Self-pay | Admitting: *Deleted

## 2017-08-05 ENCOUNTER — Ambulatory Visit (INDEPENDENT_AMBULATORY_CARE_PROVIDER_SITE_OTHER): Payer: Self-pay | Admitting: Family Medicine

## 2017-08-05 ENCOUNTER — Other Ambulatory Visit: Payer: Self-pay

## 2017-08-05 ENCOUNTER — Encounter: Payer: Self-pay | Admitting: Family Medicine

## 2017-08-05 VITALS — BP 120/82 | HR 93 | Temp 97.8°F | Ht 62.0 in | Wt 149.2 lb

## 2017-08-05 DIAGNOSIS — R5383 Other fatigue: Secondary | ICD-10-CM

## 2017-08-05 DIAGNOSIS — R519 Headache, unspecified: Secondary | ICD-10-CM

## 2017-08-05 DIAGNOSIS — I10 Essential (primary) hypertension: Secondary | ICD-10-CM

## 2017-08-05 DIAGNOSIS — R51 Headache: Secondary | ICD-10-CM

## 2017-08-05 MED ORDER — AMITRIPTYLINE HCL 25 MG PO TABS: 25.0000 mg | ORAL_TABLET | Freq: Every day | ORAL | 1 refills | Status: DC

## 2017-08-05 MED ORDER — LOSARTAN POTASSIUM-HCTZ 50-12.5 MG PO TABS: 1.0000 | ORAL_TABLET | Freq: Every day | ORAL | 3 refills | Status: DC

## 2017-08-05 MED ORDER — KETOROLAC TROMETHAMINE 60 MG/2ML IM SOLN
60.0000 mg | Freq: Once | INTRAMUSCULAR | Status: AC
  Administered 2017-08-05: 60 mg via INTRAMUSCULAR

## 2017-08-05 NOTE — Patient Instructions (Addendum)
No puede tomar mas de 3000mg  de tylenol al dia  1. [facility]  [address], [place], [place] [address] [phone]    IF you received an x-ray today, you will receive an invoice from [facility]. Please contact [facility] at [phone] with questions or concerns regarding your invoice.   IF you received labwork today, you will receive an invoice from [facility]. Please contact [facility] at [phone] with questions or concerns regarding your invoice.   Our billing staff will not be able to assist you with questions regarding bills from these companies.  You will be contacted with the lab results as soon as they are available. The fastest way to get your results is to activate your My Chart account. Instructions are located on the last page of this paperwork. If you have not heard from [place] regarding the results in 2 weeks, please contact this office.

## 2017-08-05 NOTE — Progress Notes (Signed)
2/28/201911:35 AM  [name] [name] February 03, 1966, 52 y.o. female [id number]  Chief Complaint  Patient presents with  . Migraine    taking 8 tylenol a day to help alleviate the pain    HPI:   Patient is a 52 y.o. female with past medical history significant for HTN who presents today for followup  BP controlled now that she is back on meds  Headaches - 6 months of daily continuous headaches, can only take tylenol due to gastritis. Not resolving, reports headache mostly temporal but sometimes starts in the back and radiates forward, sometime squeezing, sometimes band-like. Sometime it feels stronger on the right side, behind her eye. She denies any vision changes, she does use glasses, about a year old. She sometimes gets nausea, denies vomiting, denies any phonophobia or photophobia. Denies any changes in speech, muscle function or sensation. She used to get migraines in the past but they would always respond to 2 tylenols, these headaches are different.  She states that during this time she has been having worsening insomnia, mostly due to nightmares - not reliving past events, sometimes snores, sometimes wakes up feeling SOB. She has also been very tired and can fall asleep easily anywhere during these past months.  She is also worried because her memory is starting to fail, the other day she could not find the door of the women's bathroom at [facility] in order to get out. She had to wait for somebody to come in so she can see where the door was.  She states she is worried and mildy depressed now, but only because she is tired of having a constant headache and worried about recent changes to memories but otherwise does not have issues with mood.   Depression screen [facility] 2/9 08/05/2017 06/17/2017 02/03/2016  Decreased Interest 0 0 0  Down, Depressed, Hopeless 0 0 0  PHQ - 2 Score 0 0 0    No Known Allergies  Prior to Admission medications   Medication Sig Start Date End Date Taking?  Authorizing Provider  losartan-hydrochlorothiazide (HYZAAR) 50-12.5 MG tablet Take 1 tablet by mouth daily. 06/17/17  Yes Rutherford Guys, MD  traZODone (DESYREL) 50 MG tablet Take 0.5-1 tablets (25-50 mg total) by mouth at bedtime as needed for sleep. Patient not taking: Reported on 08/05/2017 06/17/17   Rutherford Guys, MD    Past Medical History:  Diagnosis Date  . Acid reflux   . Endometriosis   . Gastritis   . Hypertension   . Migraine   . Varicose veins     Past Surgical History:  Procedure Laterality Date  . CESAREAN SECTION     She had 5 C-Sections    Social History   Tobacco Use  . Smoking status: Never Smoker  . Smokeless tobacco: Never Used  Substance Use Topics  . Alcohol use: No    Alcohol/week: 0.0 oz    Family History  Problem Relation Age of Onset  . Stroke Father   . Hypertension Father   . Diabetes Father   . Hypertension Mother   . Thyroid disease Mother     ROS Per hpi  OBJECTIVE:  Blood pressure 120/82, pulse 93, temperature 97.8 F (36.6 C), temperature source Oral, height 5\' 2"  (1.575 m), weight 149 lb 3.2 oz (67.7 kg), last menstrual period 05/12/2012, SpO2 97 %.  Physical Exam  Constitutional: She is oriented to person, place, and time and well-developed, well-nourished, and in no distress.  HENT:  Head: Normocephalic and  atraumatic.  Mouth/Throat: Oropharynx is clear and moist. No oropharyngeal exudate.  Eyes: EOM are normal. Pupils are equal, round, and reactive to light. No scleral icterus.  Neck: Neck supple.  Cardiovascular: Normal rate, regular rhythm and normal heart sounds. Exam reveals no gallop and no friction rub.  No murmur heard. Pulmonary/Chest: Effort normal and breath sounds normal. She has no wheezes. She has no rales.  Musculoskeletal: She exhibits no edema.  Neurological: She is alert and oriented to person, place, and time. She has normal sensation, normal strength, normal reflexes and intact cranial nerves. She  has a normal Cerebellar Exam and a normal Romberg Test. She shows no pronator drift. Gait normal.  Skin: Skin is warm and dry.     ASSESSMENT and PLAN  1. Chronic nonintractable headache, unspecified headache type Starting TCA, new med r/se/b reviewed. TCA should help headaches, current mood issues and insomnia. Referring to neuro to consider eval for OSA and need for brain imagining, patient with 6 month of sx, normal exam and no escalation of other sx.  - ketorolac (TORADOL) injection 60 mg - Ambulatory referral to Neurology  2. Fatigue, unspecified type - Ambulatory referral to Neurology  3. Essential hypertension, benign - losartan-hydrochlorothiazide (HYZAAR) 50-12.5 MG tablet; Take 1 tablet by mouth daily.  Other orders - amitriptyline (ELAVIL) 25 MG tablet; Take 1-2 tablets (25-50 mg total) by mouth at bedtime.  Return in about 2 weeks (around 08/19/2017).    Rutherford Guys, MD Primary Care at [place], [place] [address] Ph.  [phone] Fax [phone]

## 2017-08-06 ENCOUNTER — Ambulatory Visit (INDEPENDENT_AMBULATORY_CARE_PROVIDER_SITE_OTHER): Payer: Self-pay | Admitting: Cardiovascular Disease

## 2017-08-06 ENCOUNTER — Encounter: Payer: Self-pay | Admitting: Cardiovascular Disease

## 2017-08-06 VITALS — BP 140/84 | HR 87 | Ht 62.0 in | Wt 150.0 lb

## 2017-08-06 DIAGNOSIS — R079 Chest pain, unspecified: Secondary | ICD-10-CM

## 2017-08-06 NOTE — Patient Instructions (Addendum)
Medication Instructions:  Your physician recommends that you continue on your current medications as directed. Please refer to the Current Medication list given to you today.  Labwork: NONE  Testing/Procedures: Your physician has requested that you have a stress echocardiogram. For further information please visit [email or web address]. Please follow instruction sheet as given.  Follow-Up: Your physician wants you to follow-up as needed with Dr. Johnsie Cancel.   If you need a refill on your cardiac medications before your next appointment, please call your pharmacy.

## 2017-08-09 ENCOUNTER — Encounter: Payer: Self-pay | Admitting: Neurology

## 2017-08-09 ENCOUNTER — Ambulatory Visit: Payer: Self-pay | Admitting: Neurology

## 2017-08-09 VITALS — BP 127/82 | HR 80 | Ht 62.0 in | Wt 152.6 lb

## 2017-08-09 DIAGNOSIS — R519 Headache, unspecified: Secondary | ICD-10-CM

## 2017-08-09 DIAGNOSIS — G43709 Chronic migraine without aura, not intractable, without status migrainosus: Secondary | ICD-10-CM

## 2017-08-09 DIAGNOSIS — R51 Headache with orthostatic component, not elsewhere classified: Secondary | ICD-10-CM

## 2017-08-09 DIAGNOSIS — G4719 Other hypersomnia: Secondary | ICD-10-CM

## 2017-08-09 DIAGNOSIS — R0681 Apnea, not elsewhere classified: Secondary | ICD-10-CM

## 2017-08-09 DIAGNOSIS — G8929 Other chronic pain: Secondary | ICD-10-CM

## 2017-08-09 DIAGNOSIS — G4440 Drug-induced headache, not elsewhere classified, not intractable: Secondary | ICD-10-CM

## 2017-08-09 NOTE — Progress Notes (Signed)
[facility]    Provider:  Dr Jaynee Eagles Referring Provider: Grant Fontana MD Primary Care Physician:  Grant Fontana MD  CC:  headaches  HPI:  [name] [name] is a 52 y.o. female here as a referral from Dr. Donnetta Hutching for headaches. She has daily headaches. Migraines started years ago, from "time to time". 6 months ago she started getting daily headaches. She is very tires, she sleeps very little. She has insomnia. She is worried because she forgets things. She snores. She wakes up at 2am and can;t go back to sleep. She can't sleep, she wakes often. She wakes up all of a sudden. She is very tired during the day. She doesn't feel rested. Fatigued all day. There is no significant depression. She wakes sometimes not breathing and can't breathe. She wakes up with the headaches. The headaches are in the frontal area, all over the head, morseo in the back lately (points to the occipital area). Headache pain is 15/10, currently it is at a 7/10. She takes Tylenol multiple times a day. She has been doing that for 6 months. No other focal neurologic deficits, associated symptoms, inciting events or modifiable factors.  Reviewed notes, labs and imaging from outside physicians, which showed:   Reviewed referring physician notes.  6 months of daily continuous headaches.  Taking Tylenol.  Headache is mostly temporal but sometimes starts in the back and radiates forward, sometimes squeezing, sometimes bandlike.  Sometimes it feels stronger on the right side behind the eye.  No vision changes.  She sometimes gets nausea, denies vomiting, denies any photophobia or phonophobia.  No changes in speech, muscle function or sensation.  She is to get migraines in the past.  Also worsening insomnia mostly due to some symptoms of nightmares.  Sometimes snores sometimes feeling shortness of breath waking up.  She has also been very tired and can fall asleep easily anywhere during these past months.  She also worries  about her memory.  She is mildly depressed.  Tired of having a constant headache and worried about recent memory changes.  CT head 06/2012  showed No acute intracranial abnormalities including mass lesion or mass effect, hydrocephalus, extra-axial fluid collection, midline shift, hemorrhage, or acute infarction, large ischemic events (personally reviewed images)     Review of Systems: Patient complains of symptoms per HPI as well as the following symptoms: blurred vision, eye pain, memory loss, insomnia, headache, decreased energy. Pertinent negatives and positives per HPI. All others negative.   Social History   Socioeconomic History  . Marital status: Married    Spouse name: Not on file  . Number of children: 5  . Years of education: Not on file  . Highest education level: Bachelor's degree (e.g., BA, AB, BS)  Social Needs  . Financial resource strain: Not on file  . Food insecurity - worry: Not on file  . Food insecurity - inability: Not on file  . Transportation needs - medical: Not on file  . Transportation needs - non-medical: Not on file  Occupational History  . Not on file  Tobacco Use  . Smoking status: Never Smoker  . Smokeless tobacco: Never Used  Substance and Sexual Activity  . Alcohol use: No    Alcohol/week: 0.0 oz  . Drug use: No  . Sexual activity: Yes    Birth control/protection: None  Other Topics Concern  . Not on file  Social History Narrative   Lives at home with her husband and children   Drinks  1 cup of decaf coffee daily, sometimes drinks soda, mostly water   Right handed    Family History  Problem Relation Age of Onset  . Stroke Father   . Hypertension Father   . Diabetes Father   . Hypertension Mother   . Thyroid disease Mother     Past Medical History:  Diagnosis Date  . Acid reflux   . Chest pain   . Endometriosis   . Gastritis   . Hypertension   . Migraine   . Varicose veins     Past Surgical History:  Procedure Laterality  Date  . CESAREAN SECTION     She had 5 C-Sections    Current Outpatient Medications  Medication Sig Dispense Refill  . amitriptyline (ELAVIL) 25 MG tablet Take 1-2 tablets (25-50 mg total) by mouth at bedtime. 60 tablet 1  . losartan-hydrochlorothiazide (HYZAAR) 50-12.5 MG tablet Take 1 tablet by mouth daily. 90 tablet 3  . traMADol (ULTRAM) 50 MG tablet Take 50 mg by mouth every 6 (six) hours as needed for moderate pain.     No current facility-administered medications for this visit.     Allergies as of 08/09/2017  . (No Known Allergies)    Vitals: BP 127/82 (BP Location: Right Arm, Patient Position: Sitting)   Pulse 80   Ht 5\' 2"  (1.575 m)   Wt 152 lb 9.6 oz (69.2 kg)   LMP 05/12/2012   BMI 27.91 kg/m  Last Weight:  Wt Readings from Last 1 Encounters:  08/09/17 152 lb 9.6 oz (69.2 kg)   Last Height:   Ht Readings from Last 1 Encounters:  08/09/17 5\' 2"  (1.575 m)   Physical exam: Exam: Gen: NAD, conversant, well nourised, well groomed                     CV: RRR, no MRG. No Carotid Bruits. No peripheral edema, warm, nontender Eyes: Conjunctivae clear without exudates or hemorrhage  Neuro: Detailed Neurologic Exam  Speech:    Speech is normal; fluent and spontaneous with normal comprehension.  Cognition:    The patient is oriented to person, place, and time;     recent and remote memory intact;     language fluent;     normal attention, concentration,     fund of knowledge Cranial Nerves:    The pupils are equal, round, and reactive to light. The fundi are normal and spontaneous venous pulsations are present. Visual fields are full to finger confrontation. Extraocular movements are intact. Trigeminal sensation is intact and the muscles of mastication are normal. The face is symmetric. The palate elevates in the midline. Hearing intact. Voice is normal. Shoulder shrug is normal. The tongue has normal motion without fasciculations.   Coordination:    Normal  finger to nose and heel to shin. Normal rapid alternating movements.   Gait:    Heel-toe and tandem gait are normal.   Motor Observation:    No asymmetry, no atrophy, and no involuntary movements noted. Tone:    Normal muscle tone.    Posture:    Posture is normal. normal erect    Strength:    Strength is V/V in the upper and lower limbs.      Sensation: intact to LT     Reflex Exam:  DTR's:    Deep tendon reflexes in the upper and lower extremities are normal bilaterally.   Toes:    The toes are downgoing bilaterally.   Clonus:  Clonus is absent.       Assessment/Plan:  52 year old spanish-only speaking female with multiple compaints including headaches, apneic events  Headaches, possible migraines with medication oversue: Continue the Amitriptyline 50mg  at bedtime, may also help with insomnia and sleeping  Medication overuse: Stop Tylenol, spent a good portion of appointment educatiing patient on rebound headache  Given positional occipital headaches need MRI brain to evaluate for space-occupying mass or other lesion, chiari, pseudotumor cerebri.   Sleep evaluation: witnessed apneic events, morning headches, excessive fatigue  Discussed: To prevent or relieve headaches, try the following: Cool Compress. Lie down and place a cool compress on your head.  Avoid headache triggers. If certain foods or odors seem to have triggered your migraines in the past, avoid them. A headache diary might help you identify triggers.  Include physical activity in your daily routine. Try a daily walk or other moderate aerobic exercise.  Manage stress. Find healthy ways to cope with the stressors, such as delegating tasks on your to-do list.  Practice relaxation techniques. Try deep breathing, yoga, massage and visualization.  Eat regularly. Eating regularly scheduled meals and maintaining a healthy diet might help prevent headaches. Also, drink plenty of fluids.  Follow a regular  sleep schedule. Sleep deprivation might contribute to headaches Consider biofeedback. With this mind-body technique, you learn to control certain bodily functions - such as muscle tension, heart rate and blood pressure - to prevent headaches or reduce headache pain.    Proceed to emergency room if you experience new or worsening symptoms or symptoms do not resolve, if you have new neurologic symptoms or if headache is severe, or for any concerning symptom.   Provided education and documentation from American headache Society toolbox including articles on: chronic migraine medication overuse headache, chronic migraines, prevention of migraines, behavioral and other nonpharmacologic treatments for headache.  Cc:  Grant Fontana MD   Sarina Ill, MD  [facility] [address] [place],  [address]  Phone [phone] Fax [phone]

## 2017-08-09 NOTE — Patient Instructions (Addendum)
Continue the Amitriptyline 50mg  at bedtime Stop Tylenol MRI brain Sleep test  Cefalea de rebote por consumo excesivo de analgsicos (Analgesic Rebound Headache) Una cefalea de rebote por consumo excesivo de analgsicos, a veces llamada cefalea por abuso de medicamentos, es un dolor de cabeza que se manifiesta despus de que desaparece el efecto de los analgsicos tomados para tratar el dolor de cabeza original (primario). Cualquier tipo de dolor de cabeza primario puede regresar como una cefalea de rebote si una persona toma analgsicos regularmente ms de tres veces por semana para tratarlo. Los tipos de Social research officer, government de cabeza primario que se suelen TEFL teacher a las cefaleas de rebote incluyen:  Migraas.  Dolores de cabeza que surgen por una tensin muscular en la zona de la cabeza y del cuello (cefaleas tensionales).  Dolores de cabeza que aparecen y vuelven a Arts administrator (recurrentes) de un lado de la cabeza y alrededor del ojo (cefaleas en racimos). Si las cefaleas de rebote continan, estas se convierten en dolores de cabeza diarios crnicos. CAUSAS Esta afeccin puede ser causada por el uso frecuente de:  Medicamentos de venta libre, como aspirina, ibuprofeno y paracetamol.  Antisinusticos y otros medicamentos que contienen cafena.  Analgsicos opiceos, como codena y [place]. SNTOMAS Los sntomas de la cefalea de rebote son los mismos que los del dolor de [place] original. Algunos de los sntomas de tipos especficos de cefaleas incluyen lo siguiente: Consulting civil engineer  Dolor pulstil e intermitente en uno o ambos lados de la cabeza.  Dolor intenso que dificulta las actividades cotidianas.  Dolor que empeora con la actividad fsica.  Nuseas, vmitos o ambos.  Dolor ante la exposicin a luces brillantes, ruidos fuertes o aromas intensos.  Sensibilidad general a las luces brillantes, a los ruidos fuertes o a los aromas intensos.  Cambios en la visin.  Adormecimiento de uno o  [employer]. Cefalea tensional.  Presin alrededor de la cabeza.  Dolor "sordo" en la cabeza.  Dolor que siente sobre la frente y los lados de la cabeza.  Sensibilidad en los msculos de la cabeza, del cuello y de los hombros. [place] en racimos  Dolor intenso que comienza en un ojo, alrededor de un ojo o en la sien.  Enrojecimiento y lagrimeo del ojo del mismo lado del dolor.  Prpados cados o hinchados.  Dolor en un lado de la cabeza.  Nuseas.  Secrecin nasal.  Piel del rostro sudorosa y plida.  Agitacin. DIAGNSTICO Esta afeccin se diagnostica mediante:  Una revisin de sus antecedentes mdicos. Esto incluye la naturaleza de sus dolores de cabeza primarios.  Una revisin de los tipos de analgsicos que ha estado tomando para tratar las cefaleas y la frecuencia con que los toma. TRATAMIENTO Esta afeccin se puede tratar de la siguiente manera:  Interrumpir el uso frecuente de los analgsicos. Al principio, esto puede [employer] cefaleas, pero, con el tiempo, el dolor debe volverse ms controlable, menos frecuente y Principal Financial.  Consultar a un especialista en cefaleas. Tal vez este profesional pueda ayudarlo a Chief Technology Officer las cefaleas y ayudar a Programmer, multimedia que estas no tengan otra causa.  Emplear mtodos alternativos de alivio del estrs, como la acupuntura, la psicoterapia, la biorregulacin y los masajes tambin pueden ayudar. Hable con el mdico respecto de qu mtodo podra ser adecuado para su caso. [place] los medicamentos de venta libre y los recetados solamente como se lo haya indicado el mdico.  Interrumpa el uso repetido de los [place], [place] se lo  haya indicado el [place] interrupcin puede ser difcil. Siga atentamente las indicaciones del mdico.  Evite los factores desencadenantes que se sabe le causan las cefaleas primarias.  Concurra a todas las visitas de control como se lo haya indicado el  mdico. Esto es importante. SOLICITE ATENCIN MDICA SI:  Sigue teniendo cefaleas despus de Optometrist los tratamientos que el mdico recomend. SOLICITE ATENCIN MDICA DE INMEDIATO SI:  Tiene nuevas cefaleas.  Tiene cefaleas que son diferentes de las que tuvo en el pasado.  Tiene adormecimiento u hormigueos en los brazos o las piernas.  Tiene cambios en la visin o en el habla. Esta informacin no tiene Marine scientist el consejo del mdico. Asegrese de hacerle al mdico cualquier pregunta que tenga. Document Released: 08/21/2008 Document Revised: 06/15/2014 Document Reviewed: 10/28/2015 Elsevier Interactive Patient Education  2018 [place].    Apnea del sueo (Sleep Apnea) La apnea del sueo es un trastorno que afecta la respiracin. Las Engineer, manufacturing con apnea del sueo tienen momentos en los que hacen breves pausas al respirar o en los que respiran de forma superficial mientras duermen. La apnea del sueo puede causar estos sntomas:  Dificultad para quedarse dormido.  Sueo o cansancio Agricultural consultant.  Irritabilidad.  Ronquidos fuertes.  Dolores de cabeza matutinos.  Dificultad para concentrarse.  Olvido de cosas.  Menos inters sexual.  Somnolencia sin motivo.  Cambios en el estado de nimo.  Cambios en la personalidad.  Depresin.  Muchos despertares nocturnos para orinar.  [employer].  Dolor de Investment banker, operational. CUIDADOS EN EL HOGAR  Haga los cambios en su rutina que le haya recomendado el mdico.  Consuma una dieta sana y Bryson Dames equilibrada.  Tome los medicamentos de venta libre y los recetados solamente como se lo haya indicado el mdico.  Evite el alcohol, los calmantes (sedantes) y los medicamentos opiceos.  Si tiene sobrepeso, tome medidas para bajar de [place].  Si le proporcionaron un dispositivo para usar mientras duerme, selo solamente como se lo haya indicado el mdico.  No consuma ningn producto que contenga tabaco, lo que incluye  cigarrillos, tabaco de Higher education careers adviser y Psychologist, sport and exercise. Si necesita ayuda para dejar de fumar, consulte al mdico.  Concurra a todas las visitas de control como se lo haya indicado el mdico. Esto es importante. SOLICITE AYUDA SI:  El dispositivo que recibi para usar mientras duerme es incmodo o parece no funcionar.  Los sntomas no mejoran.  Los sntomas empeoran. SOLICITE AYUDA DE INMEDIATO SI:  Le duele el pecho.  Tiene dificultad para inhalar suficiente aire (falta de aire).  Tiene molestias en la espalda, en los brazos o en el [place].  Presenta dificultad para hablar.  Siente debilidad en un lado del cuerpo.  Se la cae un lado de la cara. Estos sntomas pueden Sales executive. No espere hasta que los sntomas desaparezcan. Solicite atencin mdica de inmediato. Comunquese con el servicio de emergencias de su localidad (911 en los [place]). No conduzca por sus propios medios Principal Financial. Esta informacin no tiene Marine scientist el consejo del mdico. Asegrese de hacerle al mdico cualquier pregunta que tenga. Document Released: 06/27/2010 Document Revised: 09/16/2015 Document Reviewed: 03/04/2015 Elsevier Interactive Patient Education  [employer].

## 2017-08-10 ENCOUNTER — Encounter: Payer: Self-pay | Admitting: Neurology

## 2017-08-10 DIAGNOSIS — G43709 Chronic migraine without aura, not intractable, without status migrainosus: Secondary | ICD-10-CM | POA: Insufficient documentation

## 2017-08-10 DIAGNOSIS — G4440 Drug-induced headache, not elsewhere classified, not intractable: Secondary | ICD-10-CM | POA: Insufficient documentation

## 2017-08-10 HISTORY — DX: Chronic migraine without aura, not intractable, without status migrainosus: G43.709

## 2017-08-12 ENCOUNTER — Telehealth (HOSPITAL_COMMUNITY): Payer: Self-pay | Admitting: *Deleted

## 2017-08-12 NOTE — Telephone Encounter (Signed)
Patient given detailed instructions per Stress Test Requisition Sheet for test on 08/16/17 at 7:30.Patient Notified to arrive 30 minutes early, and that it is imperative to arrive on time for appointment to keep from having the test rescheduled.  Patient verbalized understanding. [name] [name]

## 2017-08-13 ENCOUNTER — Ambulatory Visit: Payer: No Typology Code available for payment source | Admitting: Cardiovascular Disease

## 2017-08-16 ENCOUNTER — Other Ambulatory Visit (HOSPITAL_COMMUNITY): Payer: No Typology Code available for payment source

## 2017-08-16 ENCOUNTER — Ambulatory Visit (HOSPITAL_COMMUNITY): Payer: No Typology Code available for payment source

## 2017-08-17 ENCOUNTER — Ambulatory Visit
Admission: RE | Admit: 2017-08-17 | Discharge: 2017-08-17 | Disposition: A | Discharge status: Home / Self Care | Payer: Self-pay | Source: Ambulatory Visit | Attending: Neurology | Admitting: Neurology

## 2017-08-17 DIAGNOSIS — R51 Headache with orthostatic component, not elsewhere classified: Secondary | ICD-10-CM

## 2017-08-17 DIAGNOSIS — R519 Headache, unspecified: Secondary | ICD-10-CM

## 2017-08-17 DIAGNOSIS — G8929 Other chronic pain: Secondary | ICD-10-CM

## 2017-08-17 MED ORDER — GADOBENATE DIMEGLUMINE 529 MG/ML IV SOLN
14.0000 mL | Freq: Once | INTRAVENOUS | Status: AC | PRN
  Administered 2017-08-17: 14 mL via INTRAVENOUS

## 2017-08-19 ENCOUNTER — Telehealth: Payer: Self-pay | Admitting: *Deleted

## 2017-08-19 NOTE — Telephone Encounter (Addendum)
Spoke with the patient regarding unremarkable MRI. She verbalized understanding and stated she is still having daily headaches. She wakes up with headaches. She has stopped taking Tylenol per Dr. Cathren Laine instructions at office visit. Per chart, Dr. Jaynee Eagles has referred pt to the sleep center for eval. Pt does not have appt set up yet. RN encouraged pt to make sure she sets up the appt with them and also stated that she would send a message to the sleep center. Pt verbalized appreciation and will call us if she needs anything further.   ----- Message from Melvenia Beam, MD sent at 08/19/2017  1:20 PM EDT ----- Unremarkable MRI brain thanks

## 2017-08-25 ENCOUNTER — Other Ambulatory Visit: Payer: Self-pay | Admitting: Emergency Medicine

## 2017-08-25 DIAGNOSIS — E785 Hyperlipidemia, unspecified: Secondary | ICD-10-CM

## 2017-08-25 NOTE — Telephone Encounter (Signed)
Med was dc'd by Dr Pamella Pert on 06/17/17.

## 2017-08-26 ENCOUNTER — Telehealth (HOSPITAL_COMMUNITY): Payer: Self-pay | Admitting: *Deleted

## 2017-08-26 NOTE — Telephone Encounter (Signed)
Patient given detailed instructions per Stress Test Requisition Sheet for test on 08/31/17 at 7:30.Patient Notified to arrive 30 minutes early, and that it is imperative to arrive on time for appointment to keep from having the test rescheduled.  Patient verbalized understanding. [name] [name]

## 2017-08-31 ENCOUNTER — Other Ambulatory Visit (HOSPITAL_COMMUNITY): Payer: No Typology Code available for payment source

## 2017-08-31 ENCOUNTER — Telehealth (HOSPITAL_COMMUNITY): Payer: Self-pay | Admitting: Cardiovascular Disease

## 2017-08-31 DIAGNOSIS — R0989 Other specified symptoms and signs involving the circulatory and respiratory systems: Secondary | ICD-10-CM

## 2017-09-03 NOTE — Telephone Encounter (Signed)
User: Cherie Dark A Date/time: 08/31/17 2:34 PM  Comment: Tried calling patient to r/s stress echo but no VM was set up.Vassie Moment  Context:  Outcome: Left Message  Phone number: [phone] Phone Type: Home Phone  Comm. type: Telephone Call type: Outgoing  Contact: Wickizer, Aysia P Relation to patient: Self

## 2017-09-09 ENCOUNTER — Telehealth (HOSPITAL_COMMUNITY): Payer: Self-pay | Admitting: *Deleted

## 2017-09-09 NOTE — Telephone Encounter (Signed)
Patient given detailed instructions per Stress Test Requisition Sheet for test on 09/15/17 at 7:30.Patient Notified to arrive 30 minutes early, and that it is imperative to arrive on time for appointment to keep from having the test rescheduled.  Patient verbalized understanding. [name] [name]

## 2017-09-15 ENCOUNTER — Ambulatory Visit (HOSPITAL_COMMUNITY): Payer: Self-pay | Attending: Cardiovascular Disease

## 2017-09-15 ENCOUNTER — Ambulatory Visit (HOSPITAL_BASED_OUTPATIENT_CLINIC_OR_DEPARTMENT_OTHER): Payer: Self-pay

## 2017-09-15 DIAGNOSIS — R0600 Dyspnea, unspecified: Secondary | ICD-10-CM | POA: Insufficient documentation

## 2017-09-15 DIAGNOSIS — R079 Chest pain, unspecified: Secondary | ICD-10-CM | POA: Insufficient documentation

## 2017-09-15 DIAGNOSIS — I10 Essential (primary) hypertension: Secondary | ICD-10-CM | POA: Insufficient documentation

## 2017-09-16 ENCOUNTER — Ambulatory Visit (INDEPENDENT_AMBULATORY_CARE_PROVIDER_SITE_OTHER): Payer: Self-pay | Admitting: Family Medicine

## 2017-09-16 ENCOUNTER — Encounter: Payer: Self-pay | Admitting: Family Medicine

## 2017-09-16 ENCOUNTER — Other Ambulatory Visit: Payer: Self-pay

## 2017-09-16 VITALS — BP 122/72 | HR 61 | Temp 98.0°F | Resp 16 | Ht 63.58 in | Wt 148.2 lb

## 2017-09-16 DIAGNOSIS — R51 Headache: Secondary | ICD-10-CM

## 2017-09-16 DIAGNOSIS — I10 Essential (primary) hypertension: Secondary | ICD-10-CM

## 2017-09-16 DIAGNOSIS — N870 Mild cervical dysplasia: Secondary | ICD-10-CM

## 2017-09-16 DIAGNOSIS — R928 Other abnormal and inconclusive findings on diagnostic imaging of breast: Secondary | ICD-10-CM

## 2017-09-16 DIAGNOSIS — R079 Chest pain, unspecified: Secondary | ICD-10-CM

## 2017-09-16 DIAGNOSIS — R519 Headache, unspecified: Secondary | ICD-10-CM

## 2017-09-16 MED ORDER — LOSARTAN POTASSIUM-HCTZ 50-12.5 MG PO TABS: 1.0000 | ORAL_TABLET | Freq: Every day | ORAL | 3 refills | Status: DC

## 2017-09-16 MED ORDER — AMITRIPTYLINE HCL 25 MG PO TABS: 25.0000 mg | ORAL_TABLET | Freq: Every day | ORAL | 5 refills | Status: DC

## 2017-09-16 NOTE — Addendum Note (Signed)
Addended by: Rutherford Guys on: 09/16/2017 08:51 AM   Modules accepted: Orders

## 2017-09-16 NOTE — Progress Notes (Signed)
4/11/20198:24 AM  Marcelino Scot Star 01-20-1966, 52 y.o. female [id number]  Chief Complaint  Patient presents with  . Hypertension    3 month follow-up    HPI:   Patient is a 52 y.o. female with past medical history significant for HTN, and headaches who presents today for follow-up.  Cards - EKG stable, clinically not significant q waves. Normal stress echo Neuro - stopped APAP, medication overuse contributing to headaches, referred for sleep eval CIN 1 - colpo done may 2017, it seems that she did not have 1 year follow-up pap with cotesting, BCC Mammogram - done 06/2017, birads 3, 6 month follow up recommended   She otherwise does not feel much better, still very tired Has not been taking amitriptyline daily, instead using prn when she gets headaches She is taking BP med daily, tolerates well, denies any concerns She has no acute concerns today  Depression screen [facility] 2/9 09/16/2017 08/05/2017 06/17/2017  Decreased Interest 0 0 0  Down, Depressed, Hopeless 0 0 0  PHQ - 2 Score 0 0 0    No Known Allergies  Prior to Admission medications   Medication Sig Start Date End Date Taking? Authorizing Provider  amitriptyline (ELAVIL) 25 MG tablet Take 1-2 tablets (25-50 mg total) by mouth at bedtime. 08/05/17  Yes Rutherford Guys, MD  losartan-hydrochlorothiazide (HYZAAR) 50-12.5 MG tablet Take 1 tablet by mouth daily. 08/05/17  Yes Rutherford Guys, MD  traMADol (ULTRAM) 50 MG tablet Take 50 mg by mouth every 6 (six) hours as needed for moderate pain.   Yes [provider]    Past Medical History:  Diagnosis Date  . Acid reflux   . Chest pain   . Endometriosis   . Gastritis   . Hypertension   . Migraine   . Varicose veins     Past Surgical History:  Procedure Laterality Date  . CESAREAN SECTION     She had 5 C-Sections    Social History   Tobacco Use  . Smoking status: Never Smoker  . Smokeless tobacco: Never Used  Substance Use Topics  . Alcohol use: No   Alcohol/week: 0.0 oz    Family History  Problem Relation Age of Onset  . Stroke Father   . Hypertension Father   . Diabetes Father   . Hypertension Mother   . Thyroid disease Mother     Review of Systems  Constitutional: Positive for malaise/fatigue. Negative for chills and fever.  Respiratory: Negative for cough and shortness of breath.   Cardiovascular: Negative for chest pain, palpitations and leg swelling.  Gastrointestinal: Negative for abdominal pain, nausea and vomiting.  Neurological: Positive for headaches.  Psychiatric/Behavioral: The patient has insomnia.      OBJECTIVE:  Blood pressure 122/72, pulse 61, temperature 98 F (36.7 C), temperature source Oral, resp. rate 16, height 5' 3.58" (1.615 m), weight 148 lb 3.2 oz (67.2 kg), last menstrual period 05/12/2012, SpO2 95 %.  Physical Exam  Constitutional: She is oriented to person, place, and time.  HENT:  Head: Normocephalic and atraumatic.  Mouth/Throat: Mucous membranes are normal.  Eyes: Pupils are equal, round, and reactive to light. EOM are normal. No scleral icterus.  Neck: Neck supple.  Pulmonary/Chest: Effort normal.  Neurological: She is alert and oriented to person, place, and time.  Skin: Skin is warm and dry.  Nursing note and vitals reviewed.   ASSESSMENT and PLAN  1. Chronic nonintractable headache, unspecified headache type Discussed amytriptyline again as migraine prophylactic. Scheduled  with sleep clinic in may.  2. Essential hypertension, benign At goal, cont current med  3. Chest pain, unspecified type Noncardiac - negative stress echo  4. CIN I (cervical intraepithelial neoplasia I) - Ambulatory Referral to [facility]  5. Abnormal mammogram of both breasts - Ambulatory Referral to [facility]  Return in about 4 months (around 01/16/2018).    Rutherford Guys, MD [facility] at [place], [place] [address] Ph.  [phone] Fax [phone]

## 2017-09-16 NOTE — Patient Instructions (Addendum)
Breast and Cervical Cancer Program for [facility]: 1. [place], Coordinator, [phone] 2. [place] [facility], [name] [name], Cordinator, [phone]   Tomarse amitriptyline todas las noches.  IF you received an x-ray today, you will receive an invoice from [facility]. Please contact [facility] at [phone] with questions or concerns regarding your invoice.   IF you received labwork today, you will receive an invoice from [facility]. Please contact [facility] at [phone] with questions or concerns regarding your invoice.   Our billing staff will not be able to assist you with questions regarding bills from these companies.  You will be contacted with the lab results as soon as they are available. The fastest way to get your results is to activate your My Chart account. Instructions are located on the last page of this paperwork. If you have not heard from [place] regarding the results in 2 weeks, please contact this office.

## 2017-10-27 ENCOUNTER — Telehealth: Payer: Self-pay

## 2017-10-27 ENCOUNTER — Institutional Professional Consult (permissible substitution): Payer: Self-pay | Admitting: Neurology

## 2017-10-27 NOTE — Telephone Encounter (Signed)
Pt did not show for their appt with Dr. Athar today.  

## 2017-10-28 ENCOUNTER — Encounter: Payer: Self-pay | Admitting: Neurology

## 2018-01-25 ENCOUNTER — Ambulatory Visit: Payer: Self-pay | Admitting: Family Medicine

## 2018-01-27 ENCOUNTER — Ambulatory Visit (HOSPITAL_COMMUNITY): Payer: Self-pay

## 2018-01-27 ENCOUNTER — Encounter (HOSPITAL_COMMUNITY): Payer: Self-pay

## 2018-02-09 ENCOUNTER — Other Ambulatory Visit (HOSPITAL_COMMUNITY): Payer: Self-pay | Admitting: *Deleted

## 2018-02-09 DIAGNOSIS — Z0000 Encounter for general adult medical examination without abnormal findings: Secondary | ICD-10-CM

## 2018-02-10 ENCOUNTER — Ambulatory Visit: Payer: Self-pay | Admitting: Adult Health

## 2018-02-10 NOTE — Addendum Note (Signed)
Addended by: Stephenie Acres on: 02/10/2018 01:09 PM   Modules accepted: Orders

## 2018-02-11 ENCOUNTER — Other Ambulatory Visit: Payer: Self-pay

## 2018-02-11 ENCOUNTER — Encounter: Payer: Self-pay | Admitting: Adult Health

## 2018-02-11 ENCOUNTER — Ambulatory Visit: Payer: Self-pay

## 2018-02-14 ENCOUNTER — Ambulatory Visit: Payer: Self-pay | Admitting: Adult Health

## 2018-02-14 ENCOUNTER — Encounter: Payer: Self-pay | Admitting: Adult Health

## 2018-02-14 VITALS — BP 122/90 | HR 80 | Ht 63.0 in | Wt 149.2 lb

## 2018-02-14 DIAGNOSIS — R413 Other amnesia: Secondary | ICD-10-CM

## 2018-02-14 DIAGNOSIS — G43009 Migraine without aura, not intractable, without status migrainosus: Secondary | ICD-10-CM

## 2018-02-14 NOTE — Progress Notes (Signed)
PATIENT: [name] [name] DOB: [birth year]  REASON FOR VISIT: follow up HISTORY FROM: patient  HISTORY OF PRESENT ILLNESS: Today 02/14/18:  [name] [name] is a 52 year old female with a history of migraine.  She returns today for follow-up.  At the last visit she was advised to continue on amitriptyline but to limit her use of Tylenol to prevent rebound headaches.  She was also referred for a sleep evaluation.  The patient reports that her headaches are a little better.  She has approximately 3 headaches a week.  The headaches tend to occur all over.  She does have photophobia and phonophobia as well as nausea.  She states that when she gets a headache she normally takes 2 Tylenols.  This eases her pain but her headache typically does not resolve until she goes to bed.  She reports that she often wakes up with headaches.  She has an appointment for a sleep evaluation but she no showed that appointment.  She reports that she has memory problems and did not realize she has an appointment.  She states that her memory problems have been occurring for approximately 1 year.  She states that she can go into a room to get soemthing but will forget why she went in there.  She states usually it does come back to her.  She is able to complete all ADLs independently.  She operates a Teacher, music without difficulty.  She returns today for follow-up.  HISTORY [name] [name] is a 52 y.o. female here as a referral from Dr. Donnetta Hutching for headaches. She has daily headaches. Migraines started years ago, from "time to time". 6 months ago she started getting daily headaches. She is very tires, she sleeps very little. She has insomnia. She is worried because she forgets things. She snores. She wakes up at 2am and can;t go back to sleep. She can't sleep, she wakes often. She wakes up all of a sudden. She is very tired during the day. She doesn't feel rested. Fatigued all day. There is no significant depression. She wakes  sometimes not breathing and can't breathe. She wakes up with the headaches. The headaches are in the frontal area, all over the head, morseo in the back lately (points to the occipital area). Headache pain is 15/10, currently it is at a 7/10. She takes Tylenol multiple times a day. She has been doing that for 6 months. No other focal neurologic deficits, associated symptoms, inciting events or modifiable factors.  Reviewed notes, labs and imaging from outside physicians, which showed:   Reviewed referring physician notes.  6 months of daily continuous headaches.  Taking Tylenol.  Headache is mostly temporal but sometimes starts in the back and radiates forward, sometimes squeezing, sometimes bandlike.  Sometimes it feels stronger on the right side behind the eye.  No vision changes.  She sometimes gets nausea, denies vomiting, denies any photophobia or phonophobia.  No changes in speech, muscle function or sensation.  She is to get migraines in the past.  Also worsening insomnia mostly due to some symptoms of nightmares.  Sometimes snores sometimes feeling shortness of breath waking up.  She has also been very tired and can fall asleep easily anywhere during these past months.  She also worries about her memory.  She is mildly depressed.  Tired of having a constant headache and worried about recent memory changes.  CT head 06/2012  showed No acute intracranial abnormalities including mass lesion or mass effect, hydrocephalus, extra-axial fluid  collection, midline shift, hemorrhage, or acute infarction, large ischemic events (personally reviewed images)    REVIEW OF SYSTEMS: Out of a complete 14 system review of symptoms, the patient complains only of the following symptoms, and all other reviewed systems are negative.  See HPI  ALLERGIES: No Known Allergies  HOME MEDICATIONS: Outpatient Medications Prior to Visit  Medication Sig Dispense Refill  . amitriptyline (ELAVIL) 25 MG tablet Take 1-2  tablets (25-50 mg total) by mouth at bedtime. 60 tablet 5  . fenofibrate (TRICOR) 145 MG tablet Take 145 mg by mouth daily.    Marland Kitchen losartan-hydrochlorothiazide (HYZAAR) 50-12.5 MG tablet Take 1 tablet by mouth daily. 90 tablet 3  . traMADol (ULTRAM) 50 MG tablet Take 50 mg by mouth every 6 (six) hours as needed for moderate pain.     No facility-administered medications prior to visit.     PAST MEDICAL HISTORY: Past Medical History:  Diagnosis Date  . Acid reflux   . Chest pain    09/15/2017 - normal stress echo  . Endometriosis   . Gastritis   . Hypertension   . Migraine   . Varicose veins     PAST SURGICAL HISTORY: Past Surgical History:  Procedure Laterality Date  . CESAREAN SECTION     She had 5 C-Sections    FAMILY HISTORY: Family History  Problem Relation Age of Onset  . Stroke Father   . Hypertension Father   . Diabetes Father   . Hypertension Mother   . Thyroid disease Mother     SOCIAL HISTORY: Social History   Socioeconomic History  . Marital status: Married    Spouse name: Not on file  . Number of children: 5  . Years of education: Not on file  . Highest education level: Bachelor's degree (e.g., BA, AB, BS)  Occupational History  . Not on file  Social Needs  . Financial resource strain: Not on file  . Food insecurity:    Worry: Not on file    Inability: Not on file  . Transportation needs:    Medical: Not on file    Non-medical: Not on file  Tobacco Use  . Smoking status: Never Smoker  . Smokeless tobacco: Never Used  Substance and Sexual Activity  . Alcohol use: No    Alcohol/week: 0.0 standard drinks  . Drug use: No  . Sexual activity: Yes    Birth control/protection: None  Lifestyle  . Physical activity:    Days per week: 0 days    Minutes per session: 0 min  . Stress: Rather much  Relationships  . Social connections:    Talks on phone: More than three times a week    Gets together: More than three times a week    Attends religious  service: More than 4 times per year    Active member of club or organization: Yes    Attends meetings of clubs or organizations: More than 4 times per year    Relationship status: Married  . Intimate partner violence:    Fear of current or ex partner: No    Emotionally abused: No    Physically abused: No    Forced sexual activity: No  Other Topics Concern  . Not on file  Social History Narrative   Lives at home with her husband and children   Drinks 1 cup of decaf coffee daily, sometimes drinks soda, mostly water   Right handed      PHYSICAL EXAM  Vitals:  02/14/18 0926  BP: 122/90  Pulse: 80  Weight: 149 lb 3.2 oz (67.7 kg)  Height: 5\' 3"  (1.6 m)   Body mass index is 26.43 kg/m.  Generalized: Well developed, in no acute distress   Neurological examination  Mentation: Alert oriented to time, place, history taking. Follows all commands speech and language fluent Cranial nerve II-XII: Pupils were equal round reactive to light. Extraocular movements were full, visual field were full on confrontational test. Facial sensation and strength were normal. Uvula tongue midline. Head turning and shoulder shrug  were normal and symmetric. Motor: The motor testing reveals 5 over 5 strength of all 4 extremities. Good symmetric motor tone is noted throughout.  Sensory: Sensory testing is intact to soft touch on all 4 extremities. No evidence of extinction is noted.  Coordination: Cerebellar testing reveals good finger-nose-finger and heel-to-shin bilaterally.  Gait and station: Gait is normal. Tandem gait is normal. Romberg is negative. No drift is seen.  Reflexes: Deep tendon reflexes are symmetric and normal bilaterally.   DIAGNOSTIC DATA (LABS, IMAGING, TESTING) - I reviewed patient records, labs, notes, testing and imaging myself where available.  Lab Results  Component Value Date   WBC 5.0 06/17/2017   HGB 13.8 06/17/2017   HCT 39.8 06/17/2017   MCV 81 06/17/2017   PLT 304  06/17/2017      Component Value Date/Time   NA 143 06/17/2017 1536   K 4.2 06/17/2017 1536   CL 104 06/17/2017 1536   CO2 23 06/17/2017 1536   GLUCOSE 98 06/17/2017 1536   GLUCOSE 117 (H) 02/03/2016 1451   BUN 12 06/17/2017 1536   CREATININE 0.65 06/17/2017 1536   CREATININE 0.69 02/03/2016 1451   CALCIUM 9.5 06/17/2017 1536   PROT 7.5 06/17/2017 1536   ALBUMIN 4.6 06/17/2017 1536   AST 21 06/17/2017 1536   ALT 22 06/17/2017 1536   ALKPHOS 110 06/17/2017 1536   BILITOT 0.2 06/17/2017 1536   GFRNONAA 104 06/17/2017 1536   GFRNONAA >89 02/03/2016 1451   GFRAA 120 06/17/2017 1536   GFRAA >89 02/03/2016 1451   Lab Results  Component Value Date   CHOL 96 (L) 06/17/2017   HDL 34 (L) 06/17/2017   LDLCALC 18 06/17/2017   TRIG 220 (H) 06/17/2017   CHOLHDL 2.8 06/17/2017   Lab Results  Component Value Date   HGBA1C 5.8 (H) 07/01/2016   No results found for: [id number] Lab Results  Component Value Date   TSH 2.570 06/17/2017      ASSESSMENT AND PLAN 52 y.o. year old female  has a past medical history of Acid reflux, Chest pain, Endometriosis, Gastritis, Hypertension, Migraine, and Varicose veins. here with:  1.  Migraine headache 2.  Memory disturbance  The patient will continue on amitriptyline.  We discussed increasing the medication however she feels that it makes her too sleepy.  I encouraged the patient to reschedule her sleep evaluation.  I will check blood work today to rule out reversible causes of memory disturbance.  Her MMSE is 24 out of 30.  If the patient has sleep apnea and this could be the cause of her memory trouble.  If her sleep evaluation is unremarkable she will be sent for neuropsychological testing.  MRI of the brain was unremarkable.  She will return in 6 months or sooner if needed.   Ward Givens, MSN, NP-C 02/14/2018, 9:32 AM [facility] [address], [place], [place] [address] [phone]

## 2018-02-14 NOTE — Patient Instructions (Signed)
Your Plan:  Continue Amitriptyline Reschedule appt for sleep eval If your symptoms worsen or you develop new symptoms please let us know.   Thank you for coming to see [place] at [facility]. I hope we have been able to provide you high quality care today.  You may receive a patient satisfaction survey over the next few weeks. We would appreciate your feedback and comments so that we may continue to improve ourselves and the health of our patients.

## 2018-02-15 ENCOUNTER — Telehealth: Payer: Self-pay | Admitting: *Deleted

## 2018-02-15 LAB — CBC WITH DIFFERENTIAL/PLATELET
BASOS ABS: 0 10*3/uL (ref 0.0–0.2)
Basos: 0 %
EOS (ABSOLUTE): 0.1 10*3/uL (ref 0.0–0.4)
Eos: 3 %
Hematocrit: 41.4 % (ref 34.0–46.6)
Hemoglobin: 13.7 g/dL (ref 11.1–15.9)
IMMATURE GRANS (ABS): 0 10*3/uL (ref 0.0–0.1)
IMMATURE GRANULOCYTES: 0 %
LYMPHS: 34 %
Lymphocytes Absolute: 1.6 10*3/uL (ref 0.7–3.1)
MCH: 27.7 pg (ref 26.6–33.0)
MCHC: 33.1 g/dL (ref 31.5–35.7)
MCV: 84 fL (ref 79–97)
MONOCYTES: 9 %
Monocytes Absolute: 0.4 10*3/uL (ref 0.1–0.9)
NEUTROS ABS: 2.6 10*3/uL (ref 1.4–7.0)
NEUTROS PCT: 54 %
PLATELETS: 280 10*3/uL (ref 150–450)
RBC: 4.95 x10E6/uL (ref 3.77–5.28)
RDW: 13.3 % (ref 12.3–15.4)
WBC: 4.8 10*3/uL (ref 3.4–10.8)

## 2018-02-15 LAB — COMPREHENSIVE METABOLIC PANEL
ALT: 21 IU/L (ref 0–32)
AST: 22 IU/L (ref 0–40)
Albumin/Globulin Ratio: 1.9 (ref 1.2–2.2)
Albumin: 4.6 g/dL (ref 3.5–5.5)
Alkaline Phosphatase: 99 IU/L (ref 39–117)
BILIRUBIN TOTAL: 0.2 mg/dL (ref 0.0–1.2)
BUN/Creatinine Ratio: 12 (ref 9–23)
BUN: 8 mg/dL (ref 6–24)
CALCIUM: 9.5 mg/dL (ref 8.7–10.2)
CHLORIDE: 106 mmol/L (ref 96–106)
CO2: 22 mmol/L (ref 20–29)
Creatinine, Ser: 0.66 mg/dL (ref 0.57–1.00)
GFR calc non Af Amer: 103 mL/min/{1.73_m2} (ref >59)
GFR, EST AFRICAN AMERICAN: 119 mL/min/{1.73_m2} (ref >59)
GLUCOSE: 71 mg/dL (ref 65–99)
Globulin, Total: 2.4 g/dL (ref 1.5–4.5)
Potassium: 4.5 mmol/L (ref 3.5–5.2)
Sodium: 144 mmol/L (ref 134–144)
TOTAL PROTEIN: 7 g/dL (ref 6.0–8.5)

## 2018-02-15 LAB — TSH: TSH: 2.94 u[IU]/mL (ref 0.450–4.500)

## 2018-02-15 LAB — VITAMIN B12

## 2018-02-15 NOTE — Telephone Encounter (Signed)
Pt returning RNs call, aware labs were unremarkable. Appreciated the call

## 2018-02-15 NOTE — Telephone Encounter (Signed)
LMOM that lab work done in our office is unremarkable--no concerns identified.  She does not need tor return this call unless she has questions/fim

## 2018-02-15 NOTE — Telephone Encounter (Signed)
-----   Message from Ward Givens, NP sent at 02/15/2018  9:36 AM EDT ----- Blood work is unremarkable.  Please call patient with results

## 2018-02-15 NOTE — Telephone Encounter (Signed)
Noted/fim 

## 2018-03-02 ENCOUNTER — Telehealth: Payer: Self-pay

## 2018-03-02 MED ORDER — LISINOPRIL-HYDROCHLOROTHIAZIDE 20-25 MG PO TABS: 1.0000 | ORAL_TABLET | Freq: Every day | ORAL | 1 refills | Status: DC

## 2018-03-02 NOTE — Telephone Encounter (Signed)
Last OV 09/2017 I just sent new BP medication to pharmacy Please schedule for followup in about 2-4 weeks to see how new medication is working Thanks I Pamella Pert, MD

## 2018-03-02 NOTE — Telephone Encounter (Signed)
Pt in office to advise she has discontinued medications losartan/hctz50/12.5 due to recall and would like to be placed on another substitute medication.   Advised message would be sent to provider for review.  Pharmacy [facility]   Please advise.  Dgaddy, CMA

## 2018-04-19 ENCOUNTER — Encounter (HOSPITAL_COMMUNITY): Payer: Self-pay

## 2018-04-19 ENCOUNTER — Ambulatory Visit (HOSPITAL_COMMUNITY)
Admission: RE | Admit: 2018-04-19 | Discharge: 2018-04-19 | Disposition: A | Discharge status: Home / Self Care | Payer: Self-pay | Source: Ambulatory Visit | Attending: Obstetrics and Gynecology | Admitting: Obstetrics and Gynecology

## 2018-04-19 VITALS — BP 110/68 | Wt 146.0 lb

## 2018-04-19 DIAGNOSIS — Z01419 Encounter for gynecological examination (general) (routine) without abnormal findings: Secondary | ICD-10-CM

## 2018-04-19 NOTE — Patient Instructions (Addendum)
Explained to [name] [name] that if today's Pap smear is normal that her next Pap smear is due in one year due to her history of an abnormal Pap smear 09/09/2015 and only having one normal Pap smear since colposcopy. Let patient know will follow-up with her within the next couple of weeks with results of Pap smear by letter or phone. Informed patient that her BCCCP will need to be renewed at the end of January 2020. [name] [name] verbalized understanding.  Kilo Eshelman, Arvil Chaco, RN 9:38 AM

## 2018-04-19 NOTE — Progress Notes (Signed)
Complaints of left ovary pain for over 3 months. Will refer to the [facility] [place] [facility] for follow-up.  Pap Smear: Pap smear completed today. Last Pap smear was 09/14/2016 at the free cervical cancer screening sponsored by [facility] and normal. Patient has a history of an abnormal Pap smear 09/09/2015 in [facility] that was LGSIL and positive HPV. 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 Last two Pap smears and colposcopy results are in Epic.     Pelvic/Bimanual   Ext Genitalia No lesions, no swelling and no discharge observed on external genitalia.         Vagina Vagina pink and normal texture. No lesions or discharge observed in vagina.          Cervix Cervix is present. Cervix pink and of normal texture. No discharge observed.     Uterus Uterus is present and palpable. Uterus in normal position and normal size.        Adnexae Bilateral ovaries present and palpable. Complaints of left ovary pain on exam. Sent referral to the [facility]     Rectovaginal No rectal exam completed today since patient had no rectal complaints. No skin abnormalities observed on exam.    Smoking History: Patient has never smoked.  Patient Navigation: Patient education provided. Access to services provided for patient through [facility] program. Spanish interpreter provided.   Colorectal Cancer Screening: Per patient has never had a colonoscopy completed. No complaints today. FIT Test given to patient to complete and return to BCCCP.  Breast and Cervical Cancer Risk Assessment: Patient has no family history of breast cancer, known genetic mutations, or radiation treatment to the chest before age 78. Patient has a history of cervical dysplasia. Patient has not history of being immunocompromised or DES exposure in-utero. . Risk Assessment    Risk Scores      04/19/2018   Last edited by: Armond Hang, LPN   5-year risk: 0.7 %   Lifetime risk: 6.1 %         Used Spanish interpreter Rudene Anda from [employer].

## 2018-04-19 NOTE — Addendum Note (Signed)
Encounter addended by: Loletta Parish, RN on: 04/19/2018 1:22 PM  Actions taken: Sign clinical note

## 2018-04-21 ENCOUNTER — Encounter: Payer: Self-pay | Admitting: Neurology

## 2018-04-21 ENCOUNTER — Ambulatory Visit: Payer: Self-pay | Admitting: Neurology

## 2018-04-21 VITALS — BP 136/77 | HR 107 | Ht 62.0 in | Wt 150.0 lb

## 2018-04-21 DIAGNOSIS — R419 Unspecified symptoms and signs involving cognitive functions and awareness: Secondary | ICD-10-CM

## 2018-04-21 DIAGNOSIS — R51 Headache: Secondary | ICD-10-CM

## 2018-04-21 DIAGNOSIS — R519 Headache, unspecified: Secondary | ICD-10-CM

## 2018-04-21 DIAGNOSIS — G4719 Other hypersomnia: Secondary | ICD-10-CM

## 2018-04-21 DIAGNOSIS — R351 Nocturia: Secondary | ICD-10-CM

## 2018-04-21 LAB — CYTOLOGY - PAP
Diagnosis: NEGATIVE
HPV: DETECTED — AB

## 2018-04-21 NOTE — Progress Notes (Signed)
Subjective:    Patient ID: [name] [name] is a 52 y.o. female.  HPI     Star Age, MD, PhD [facility] [address], [address].O[address] [place], [place] [address]  Dear Berta Minor,   I saw your patient, [name] [name], upon your kind request in my clinic today for initial consultation of her sleep disorder, in particular, concern for underlying obstructive sleep apnea. The patient is accompanied by an interpreter today. Of note, she no showed on 10/27/17. As you know, [name] [name] is a 52year-old right-handed woman with an underlying medical history of migraine headaches, history of gastritis, endometriosis, chest pain, reflux disease, hypertension, and borderline overweight state, who reports sleep disruption, difficulty with sleep maintenance, nonrestorative sleep and has had morning headaches. I reviewed your office note from 08/09/2017. She may have some snoring but is not sure. Her husband does not seem to complain about it. She does have sleep disruption from nocturia, about 3 times per average night. She is a nonsmoker and does not utilize alcohol or caffeine on a regular basis. Her bedtime is between 10 and 11 and rise time between 6 and 7. She works as a Scientist, water quality at [employer]. Her Epworth sleepiness score is 8 out of 24, fatigue score is 47 out of 63. She admits that she sleeps a little better with amitriptyline that you prescribed for HAs, but also reports that she does not take it every night.  Her Past Medical History Is Significant For: Past Medical History:  Diagnosis Date  . Acid reflux   . Chest pain    09/15/2017 - normal stress echo  . Endometriosis   . Gastritis   . Hypertension   . Migraine   . Varicose veins     Her Past Surgical History Is Significant For: Past Surgical History:  Procedure Laterality Date  . CESAREAN SECTION     She had 5 C-Sections    Her Family History Is Significant For: Family History  Problem Relation Age of Onset  .  Stroke Father   . Hypertension Father   . Diabetes Father   . Hypertension Mother   . Thyroid disease Mother     Her Social History Is Significant For: Social History   Socioeconomic History  . Marital status: Married    Spouse name: Not on file  . Number of children: 5  . Years of education: Not on file  . Highest education level: Bachelor's degree (e.g., BA, AB, BS)  Occupational History  . Not on file  Social Needs  . Financial resource strain: Not on file  . Food insecurity:    Worry: Not on file    Inability: Not on file  . Transportation needs:    Medical: Not on file    Non-medical: Not on file  Tobacco Use  . Smoking status: Never Smoker  . Smokeless tobacco: Never Used  Substance and Sexual Activity  . Alcohol use: No    Alcohol/week: 0.0 standard drinks  . Drug use: No  . Sexual activity: Yes    Birth control/protection: None  Lifestyle  . Physical activity:    Days per week: 0 days    Minutes per session: 0 min  . Stress: Rather much  Relationships  . Social connections:    Talks on phone: More than three times a week    Gets together: More than three times a week    Attends religious service: More than 4 times per year  Active member of club or organization: Yes    Attends meetings of clubs or organizations: More than 4 times per year    Relationship status: Married  Other Topics Concern  . Not on file  Social History Narrative   Lives at home with her husband and children   Drinks 1 cup of decaf coffee daily, sometimes drinks soda, mostly water   Right handed    Her Allergies Are:  No Known Allergies:   Her Current Medications Are:  Outpatient Encounter Medications as of 04/21/2018  Medication Sig  . amitriptyline (ELAVIL) 25 MG tablet Take 1-2 tablets (25-50 mg total) by mouth at bedtime.  . fenofibrate (TRICOR) 145 MG tablet Take 145 mg by mouth daily.  Marland Kitchen lisinopril-hydrochlorothiazide (PRINZIDE,ZESTORETIC) 20-25 MG tablet Take 1 tablet  by mouth daily.   No facility-administered encounter medications on file as of 04/21/2018.   : Review of Systems:  Out of a complete 14 point review of systems, all are reviewed and negative with the exception of these symptoms as listed below:  Review of Systems  Neurological:       Pt presents today to discuss her sleep. Pt has never had a sleep study.  Epworth Sleepiness Scale 0= would never doze 1= slight chance of dozing 2= moderate chance of dozing 3= high chance of dozing  Sitting and reading: 2 Watching TV: 2 Sitting inactive in a public place (ex. Theater or meeting): 0 As a passenger in a car for an hour without a break: 2 Lying down to rest in the afternoon: 2 Sitting and talking to someone: 0 Sitting quietly after lunch (no alcohol): 0 In a car, while stopped in traffic: 0 Total: 8     Objective:  Neurological Exam  Physical Exam Physical Examination:   Vitals:   04/21/18 1118  BP: 136/77  Pulse: (!) 107    General Examination: The patient is a very pleasant 52 y.o. female in no acute distress. She appears well-developed and well-nourished and well groomed.   HEENT: Normocephalic, atraumatic, pupils are equal, round and reactive to light and accommodation. Extraocular tracking is good without limitation to gaze excursion or nystagmus noted. Normal smooth pursuit is noted. Hearing is grossly intact. Face is symmetric with normal facial animation and normal facial sensation. Speech is clear with no dysarthria noted. There is no hypophonia. There is no lip, neck/head, jaw or voice tremor. Neck is supple with full range of passive and active motion. There are no carotid bruits on auscultation. Oropharynx exam reveals: mild mouth dryness, adequate dental hygiene and moderate airway crowding, due to tonsils of 2+ on the right and 1+ on the left. She has a smaller airway entry. Neck circumference is 13-5/8 inches.Tongue protrudes centrally and palate elevates  symmetrically.  Chest: Clear to auscultation without wheezing, rhonchi or crackles noted.  Heart: S1+S2+0, regular and normal without murmurs, rubs or gallops noted.   Abdomen: Soft, non-tender and non-distended with normal bowel sounds appreciated on auscultation.  Extremities: There is no pitting edema in the distal lower extremities bilaterally.   Skin: Warm and dry without trophic changes noted.  Musculoskeletal: exam reveals no obvious joint deformities, tenderness or joint swelling or erythema.   Neurologically:  Mental status: The patient is awake, alert and oriented in all 4 spheres. Her immediate and remote memory, attention, language skills and fund of knowledge are appropriate. There is no evidence of aphasia, agnosia, apraxia or anomia. Speech is clear with normal prosody and enunciation. Thought process is  linear. Mood is normal and affect is normal.  Cranial nerves II - XII are as described above under HEENT exam. In addition: shoulder shrug is normal with equal shoulder height noted. Motor exam: Normal bulk, strength and tone is noted. There is no tremor. Fine motor skills and coordination: intact grossly.   Cerebellar testing: No dysmetria or intention tremor. There is no truncal or gait ataxia.  Sensory exam: intact to light touch.  Gait, station and balance: She stands easily. No veering to one side is noted. No leaning to one side is noted. Posture is age-appropriate and stance is narrow based. Gait shows normal stride length and normal pace. No problems turning are noted.   Assessment and Plan:  In summary, [name] [name] is a 52 y.o.-year old female with an underlying medical history of migraine headaches, history of gastritis, endometriosis, chest pain, reflux disease, hypertension, and borderline overweight state, whose history and physical exam raise some concern for obstructive sleep apnea (OSA). I had a long chat with the patient about my findings and the diagnosis  of OSA, its prognosis and treatment options. We talked about medical treatments, surgical interventions and non-pharmacological approaches. I explained in particular the risks and ramifications of untreated moderate to severe OSA, especially with respect to developing cardiovascular disease down the Road, including congestive heart failure, difficult to treat hypertension, cardiac arrhythmias, or stroke. Even type 2 diabetes has, in part, been linked to untreated OSA. Symptoms of untreated OSA include daytime sleepiness, memory problems, mood irritability and mood disorder such as depression and anxiety, lack of energy, as well as recurrent headaches, especially morning headaches.   She would like to proceed with a home sleep test due to cost. We will call her with her test result. She would be willing to try CPAP therapy or AutoPap if needed. I answered all her questions today and the patient was in agreement. I plan to see her back after the sleep study is completed and encouraged her to call with any interim questions, concerns, problems or updates.   Thank you very much for allowing me to participate in the care of this nice patient. If I can be of any further assistance to you please do not hesitate to call me at [phone].  Sincerely,   Star Age, MD, PhD

## 2018-04-21 NOTE — Patient Instructions (Signed)
Thank you for choosing [facility] for your sleep related care! It was nice to meet you today! I appreciate that you entrust me with your sleep related healthcare concerns. I hope, I was able to address at least some of your concerns today, and that I can help you feel reassured and also get better.    Here is what we discussed today and what we came up with as our plan for you:    Based on your symptoms and your exam I believe you are at risk for obstructive sleep apnea (aka OSA), and I think we should proceed with a sleep study to determine whether you do or do not have OSA and how severe it is.  We will do a home sleep test, as requested.  Even, if you have mild OSA, I may want you to consider treatment with CPAP, as treatment of even borderline or mild sleep apnea can result and improvement of symptoms such as sleep disruption, daytime sleepiness, nighttime bathroom breaks, restless leg symptoms, improvement of headache syndromes, even improved mood disorder.   Please remember, the long-term risks and ramifications of untreated moderate to severe obstructive sleep apnea are: increased Cardiovascular disease, including congestive heart failure, stroke, difficult to control hypertension, treatment resistant obesity, arrhythmias, especially irregular heartbeat commonly known as A. Fib. (atrial fibrillation); even type 2 diabetes has been linked to untreated OSA.   Sleep apnea can cause disruption of sleep and sleep deprivation in most cases, which, in turn, can cause recurrent headaches, problems with memory, mood, concentration, focus, and vigilance. Most people with untreated sleep apnea report excessive daytime sleepiness, which can affect their ability to drive. Please do not drive if you feel sleepy. Patients with sleep apnea developed difficulty initiating and maintaining sleep (aka insomnia).   Having sleep apnea may increase your risk for other sleep disorders, including  involuntary behaviors sleep such as sleep terrors, sleep talking, sleepwalking.    Having sleep apnea can also increase your risk for restless leg syndrome and leg movements at night.   Please note that untreated obstructive sleep apnea may carry additional perioperative morbidity. Patients with significant obstructive sleep apnea (typically, in the moderate to severe degree) should receive, if possible, perioperative PAP (positive airway pressure) therapy and the surgeons and particularly the anesthesiologists should be informed of the diagnosis and the severity of the sleep disordered breathing.   I will likely see you back after your sleep study to go over the test results and where to go from there. We will call you after your sleep study to advise about the results (most likely, you will hear from [place], my nurse) and to set up an appointment at the time, as necessary.    Our sleep lab administrative assistant will call you to schedule your sleep study and give you further instructions, regarding the check in process for the sleep study, arrival time, what to bring, when you can expect to leave after the study, etc., and to answer any other logistical questions you may have. If you don't hear back from her by about 2 weeks from now, please feel free to call her direct line at [phone] or you can call our general clinic number, or email [place] through My Chart.

## 2018-04-27 ENCOUNTER — Ambulatory Visit (INDEPENDENT_AMBULATORY_CARE_PROVIDER_SITE_OTHER): Payer: Self-pay

## 2018-04-27 ENCOUNTER — Other Ambulatory Visit: Payer: Self-pay

## 2018-04-27 ENCOUNTER — Ambulatory Visit: Payer: Self-pay | Admitting: Family Medicine

## 2018-04-27 ENCOUNTER — Encounter: Payer: Self-pay | Admitting: Family Medicine

## 2018-04-27 VITALS — BP 126/87 | HR 78 | Temp 98.3°F | Ht 62.0 in | Wt 145.8 lb

## 2018-04-27 DIAGNOSIS — M25562 Pain in left knee: Secondary | ICD-10-CM

## 2018-04-27 DIAGNOSIS — G8929 Other chronic pain: Secondary | ICD-10-CM

## 2018-04-27 DIAGNOSIS — I10 Essential (primary) hypertension: Secondary | ICD-10-CM

## 2018-04-27 DIAGNOSIS — R519 Headache, unspecified: Secondary | ICD-10-CM

## 2018-04-27 DIAGNOSIS — Z23 Encounter for immunization: Secondary | ICD-10-CM

## 2018-04-27 DIAGNOSIS — R51 Headache: Secondary | ICD-10-CM

## 2018-04-27 LAB — LIPID PANEL
Chol/HDL Ratio: 4.1 ratio (ref 0.0–4.4)
Cholesterol, Total: 169 mg/dL (ref 100–199)
HDL: 41 mg/dL (ref >39)
LDL Calculated: 93 mg/dL (ref 0–99)
Triglycerides: 175 mg/dL — ABNORMAL HIGH (ref 0–149)
VLDL Cholesterol Cal: 35 mg/dL (ref 5–40)

## 2018-04-27 LAB — CMP14+EGFR
ALT: 25 IU/L (ref 0–32)
AST: 26 IU/L (ref 0–40)
Albumin/Globulin Ratio: 1.6 (ref 1.2–2.2)
Albumin: 4.7 g/dL (ref 3.5–5.5)
Alkaline Phosphatase: 110 IU/L (ref 39–117)
BUN/Creatinine Ratio: 14 (ref 9–23)
BUN: 11 mg/dL (ref 6–24)
Bilirubin Total: 0.6 mg/dL (ref 0.0–1.2)
CO2: 25 mmol/L (ref 20–29)
Calcium: 10.1 mg/dL (ref 8.7–10.2)
Chloride: 101 mmol/L (ref 96–106)
Creatinine, Ser: 0.78 mg/dL (ref 0.57–1.00)
GFR calc Af Amer: 102 mL/min/{1.73_m2} (ref >59)
GFR calc non Af Amer: 88 mL/min/{1.73_m2} (ref >59)
Globulin, Total: 2.9 g/dL (ref 1.5–4.5)
Glucose: 95 mg/dL (ref 65–99)
Potassium: 4.1 mmol/L (ref 3.5–5.2)
Sodium: 142 mmol/L (ref 134–144)
Total Protein: 7.6 g/dL (ref 6.0–8.5)

## 2018-04-27 MED ORDER — IBUPROFEN 600 MG PO TABS: 600.0000 mg | ORAL_TABLET | Freq: Three times a day (TID) | ORAL | 0 refills | Status: DC | PRN

## 2018-04-27 MED ORDER — ATORVASTATIN CALCIUM 20 MG PO TABS: 20.0000 mg | ORAL_TABLET | Freq: Every day | ORAL | 3 refills | Status: DC

## 2018-04-27 MED ORDER — PANTOPRAZOLE SODIUM 20 MG PO TBEC: 20.0000 mg | DELAYED_RELEASE_TABLET | Freq: Every day | ORAL | 0 refills | Status: DC

## 2018-04-27 MED ORDER — AMITRIPTYLINE HCL 25 MG PO TABS: 25.0000 mg | ORAL_TABLET | Freq: Every day | ORAL | 3 refills | Status: DC

## 2018-04-27 MED ORDER — LISINOPRIL-HYDROCHLOROTHIAZIDE 20-25 MG PO TABS: 1.0000 | ORAL_TABLET | Freq: Every day | ORAL | 3 refills | Status: DC

## 2018-04-27 NOTE — Patient Instructions (Addendum)
Favor volver sacar cita con [facility] para dar seguimiento al [employer] del seno izquierdo y su papanicola  Tomar calcio 1200mg  al dia, vitamin D 1000 unidades al dia    If you have lab work done today you will be contacted with your lab results within the next 2 weeks.  If you have not heard from [place] then please contact us. The fastest way to get your results is to register for My Chart.   IF you received an x-ray today, you will receive an invoice from [facility] Radiology. Please contact [facility] Radiology at [phone] with questions or concerns regarding your invoice.   IF you received labwork today, you will receive an invoice from [place]. Please contact [facility] at [phone] with questions or concerns regarding your invoice.   Our billing staff will not be able to assist you with questions regarding bills from these companies.  You will be contacted with the lab results as soon as they are available. The fastest way to get your results is to activate your My Chart account. Instructions are located on the last page of this paperwork. If you have not heard from [place] regarding the results in 2 weeks, please contact this office.

## 2018-04-27 NOTE — Progress Notes (Signed)
11/20/20199:40 AM  [name] [name] [birth year], 52 y.o. female [id number]  Chief Complaint  Patient presents with  . Hypertension  . Migraine    wants to talk about possibly starting another medication. Currently taking amitripyline  . Medication Refill    lisinopril, tricor. Wants hard copy of rx to take to [facility] pharmacy    HPI:   Patient is a 52 y.o. female with past medical history significant for chronic headaches, HTN, HLP who presents today for routine followup  Had most recent pap nov 2019, normal + HPV Has not had repeat breast US Goes thru [facility]  Saw neurology nov 2019 - home sleep study ordered MRI was normal amitriptylaline 10m helps with headache, 571mtoo sedating  Left knee pain - intermittent, chronic, lateral knee, no swelling, no erythema, worse with going up and down stairs  Fall Risk  04/27/2018 09/16/2017 08/05/2017 06/17/2017 02/03/2016  Falls in the past year? 0 No No No No     [facility] 09/16/2017 08/05/2017 06/17/2017  Decreased Interest 0 0 0  Down, Depressed, Hopeless 0 0 0  PHQ - 2 Score 0 0 0    No Known Allergies  Prior to Admission medications   Medication Sig Start Date End Date Taking? Authorizing Provider  amitriptyline (ELAVIL) 25 MG tablet Take 1-2 tablets (25-50 mg total) by mouth at bedtime. 09/16/17   SaRutherford GuysMD  fenofibrate (TRICOR) 145 MG tablet Take 145 mg by mouth daily. 03/07/15   [provider]  lisinopril-hydrochlorothiazide (PRINZIDE,ZESTORETIC) 20-25 MG tablet Take 1 tablet by mouth daily. 03/02/18   SaRutherford GuysMD    Past Medical History:  Diagnosis Date  . Acid reflux   . Chest pain    09/15/2017 - normal stress echo  . Endometriosis   . Gastritis   . Hypertension   . Migraine   . Varicose veins     Past Surgical History:  Procedure Laterality Date  . CESAREAN SECTION     She had 5 C-Sections    Social History   Tobacco Use  . Smoking status: Never Smoker  .  Smokeless tobacco: Never Used  Substance Use Topics  . Alcohol use: No    Alcohol/week: 0.0 standard drinks    Family History  Problem Relation Age of Onset  . Stroke Father   . Hypertension Father   . Diabetes Father   . Hypertension Mother   . Thyroid disease Mother     Review of Systems  Constitutional: Positive for malaise/fatigue. Negative for chills and fever.  Respiratory: Negative for cough and shortness of breath.   Cardiovascular: Negative for chest pain, palpitations and leg swelling.  Gastrointestinal: Negative for abdominal pain, nausea and vomiting.  Musculoskeletal: Positive for joint pain.  Neurological: Positive for headaches.     OBJECTIVE:  Blood pressure 126/87, pulse 78, temperature 98.3 F (36.8 C), temperature source Oral, height 5' 2"  (1.575 m), weight 145 lb 12.8 oz (66.1 kg), last menstrual period 05/12/2012, SpO2 97 %. Body mass index is 26.67 kg/m.   Physical Exam  Constitutional: She is oriented to person, place, and time. She appears well-developed and well-nourished.  HENT:  Head: Normocephalic and atraumatic.  Mouth/Throat: Oropharynx is clear and moist. No oropharyngeal exudate.  Eyes: Pupils are equal, round, and reactive to light. Conjunctivae and EOM are normal. No scleral icterus.  Neck: Neck supple.  Cardiovascular: Normal rate, regular rhythm and normal heart sounds. Exam reveals no gallop and no friction rub.  No murmur heard. Pulmonary/Chest: Effort normal and breath sounds normal. She has no wheezes. She has no rales.  Musculoskeletal: She exhibits no edema.       Left knee: She exhibits normal range of motion, no swelling, no effusion, no LCL laxity, normal patellar mobility, normal meniscus and no MCL laxity. Tenderness (lateral knee) found.  Neurological: She is alert and oriented to person, place, and time.  Skin: Skin is warm and dry.  Psychiatric: She has a normal mood and affect.  Nursing note and vitals reviewed.   No  results found for this or any previous visit (from the past 24 hour(s)).  Dg Knee Complete 4 Views Left  Result Date: 04/27/2018 CLINICAL DATA:  Chronic left knee pain. EXAM: LEFT KNEE - COMPLETE 4+ VIEW COMPARISON:  None. FINDINGS: No evidence of fracture, dislocation, or joint effusion. No evidence of arthropathy or other focal bone abnormality. Soft tissues are unremarkable. IMPRESSION: Negative. Electronically Signed   By: Marijo Conception, M.D.   On: 04/27/2018 10:21     ASSESSMENT and PLAN  1. Chronic nonintractable headache, unspecified headache type No further adjustment to medications until home sleep study done.   2. Essential hypertension, benign Controlled. Continue current regime.  - Lipid panel - CMP14+EGFR  3. Need for prophylactic vaccination with combined diphtheria-tetanus-pertussis (DTP) vaccine - Td vaccine greater than or equal to 7yo preservative free IM  4. Chronic pain of left knee Normal xray, unremarkable exam. Discussed RICE therapy. rx for ibuprofen given - DG Knee Complete 4 Views Left; Future  Other orders - lisinopril-hydrochlorothiazide (PRINZIDE,ZESTORETIC) 20-25 MG tablet; Take 1 tablet by mouth daily. - amitriptyline (ELAVIL) 25 MG tablet; Take 1 tablet (25 mg total) by mouth at bedtime. - ibuprofen (ADVIL,MOTRIN) 600 MG tablet; Take 1 tablet (600 mg total) by mouth every 8 (eight) hours as needed. - pantoprazole (PROTONIX) 20 MG tablet; Take 1 tablet (20 mg total) by mouth daily. - atorvastatin (LIPITOR) 20 MG tablet; Take 1 tablet (20 mg total) by mouth daily.    Return in about 6 months (around 10/26/2018).    Rutherford Guys, MD Primary Care at [address] [place], [place] [address] Ph.  [phone] Fax [phone]

## 2018-04-29 ENCOUNTER — Other Ambulatory Visit: Payer: Self-pay

## 2018-05-02 LAB — FECAL OCCULT BLOOD, IMMUNOCHEMICAL: FECAL OCCULT BLD: NEGATIVE

## 2018-05-02 LAB — SPECIMEN STATUS REPORT

## 2018-05-06 ENCOUNTER — Encounter: Payer: Self-pay | Admitting: *Deleted

## 2018-05-10 ENCOUNTER — Encounter: Payer: Self-pay | Admitting: Family Medicine

## 2018-05-10 LAB — HM MAMMOGRAPHY

## 2018-05-12 ENCOUNTER — Ambulatory Visit: Payer: Self-pay | Admitting: Family Medicine

## 2018-05-13 ENCOUNTER — Encounter: Payer: Self-pay | Admitting: Family Medicine

## 2018-05-13 NOTE — Progress Notes (Signed)
Patient did not keep appointment today. She may call to reschedule.  

## 2018-05-16 ENCOUNTER — Ambulatory Visit (INDEPENDENT_AMBULATORY_CARE_PROVIDER_SITE_OTHER): Payer: Self-pay | Admitting: Neurology

## 2018-05-16 ENCOUNTER — Encounter (HOSPITAL_COMMUNITY): Payer: Self-pay

## 2018-05-16 DIAGNOSIS — G4733 Obstructive sleep apnea (adult) (pediatric): Secondary | ICD-10-CM

## 2018-05-16 DIAGNOSIS — R351 Nocturia: Secondary | ICD-10-CM

## 2018-05-16 DIAGNOSIS — G4719 Other hypersomnia: Secondary | ICD-10-CM

## 2018-05-16 DIAGNOSIS — R519 Headache, unspecified: Secondary | ICD-10-CM

## 2018-05-16 DIAGNOSIS — R51 Headache: Secondary | ICD-10-CM

## 2018-05-16 DIAGNOSIS — R419 Unspecified symptoms and signs involving cognitive functions and awareness: Secondary | ICD-10-CM

## 2018-05-18 ENCOUNTER — Telehealth: Payer: Self-pay

## 2018-05-18 NOTE — Telephone Encounter (Signed)
I called pt to discuss her sleep study results using [address], [place], [place]: [place].  I advised pt that Dr. Rexene Alberts reviewed their sleep study results and found that pt has mild osa. Dr. Rexene Alberts recommends that pt start an auto pap. I reviewed PAP compliance expectations with the pt. Pt is agreeable to starting an auto-PAP. I advised pt that an order will be sent to a DME, [facility], and [facility] will call the pt within about one week after they file with the pt's insurance. [facility] will show the pt how to use the machine, fit for masks, and troubleshoot the auto-PAP if needed. A follow up appt was made for insurance purposes with Hoyle Sauer, NP on 08/09/18 at 10:45am. Pt verbalized understanding to arrive 15 minutes early and bring their auto-PAP. Pt verbalized understanding of results. Pt had no questions at this time but was encouraged to call back if questions arise. I have sent the order to [facility] and have received confirmation that they have received the order.

## 2018-05-18 NOTE — Telephone Encounter (Signed)
-----   Message from Star Age, MD sent at 05/18/2018  8:28 AM EST ----- Patient referred by Dr. Jaynee Eagles, seen by me on 04/21/18, HST on 05/16/18.  Please call and notify the patient that the recent home sleep test showed obstructive sleep apnea. OSA is the mild range, but I recommend treatment with autoPAP, to see if she feels better, esp with regards to sleep quality and HAs. We will send order to a DME co. The DME representative will educate her on how to use the machine, how to put the mask on, etc. I have placed an order in the chart. Please send referral, talk to patient, send report to referring MD. We will need a FU in sleep clinic for 10 weeks post-PAP set up, please arrange that with me or one of our NPs. Thanks,   Star Age, MD, PhD [facility] [place] [facility])

## 2018-05-18 NOTE — Addendum Note (Signed)
Addended by: Star Age on: 05/18/2018 08:28 AM   Modules accepted: Orders

## 2018-05-18 NOTE — Progress Notes (Signed)
Patient referred by Dr. Jaynee Eagles, seen by me on 04/21/18, HST on 05/16/18.  Please call and notify the patient that the recent home sleep test showed obstructive sleep apnea. OSA is the mild range, but I recommend treatment with autoPAP, to see if she feels better, esp with regards to sleep quality and HAs. We will send order to a DME co. The DME representative will educate her on how to use the machine, how to put the mask on, etc. I have placed an order in the chart. Please send referral, talk to patient, send report to referring MD. We will need a FU in sleep clinic for 10 weeks post-PAP set up, please arrange that with me or one of our NPs. Thanks,   Star Age, MD, PhD [facility])

## 2018-05-18 NOTE — Procedures (Signed)
[facility] Sleep @[facility] [place], [place] [address] NAME:  [name] [name]                                                  DOB: 09-17-1965 MEDICAL RECORD no: [id number]                             DOS:  05/16/2018 REFERRING PHYSICIAN: Sarina Ill, MD STUDY PERFORMED: Home Sleep Test on [facility] HISTORY: 52 year-old woman with a history of migraine headaches, history of gastritis, endometriosis, chest pain, reflux disease, hypertension, and borderline overweight state, who reports sleep disruption, and morning headaches. Epworth sleepiness score is 8 out of 24, BMI of 27.6. STUDY RESULTS:    Total Recording Time: 10 hours, 4 min, Total Sleep Time:  9 hours, 30 min Total Apnea/Hypopnea Index (AHI): 13.2/h        RDI:  14.6/h Average Oxygen Saturation:  94 %         Lowest Oxygen Desaturation: 86 %  Total Time Oxygen Saturation Below or at 88 %:  0.5 minutes  Average Heart Rate:  73  bpm (between 57 and 99 bpm) IMPRESSION: OSA RECOMMENDATION: This home sleep test demonstrates overall mild obstructive sleep apnea with a total AHI of 13.2/hour and O2 nadir of 86%. Given the patient's medical history and sleep related complaints, ongoing treatment with positive airway pressure is recommended. This can be achieved in the form of autoPAP at home. A full night CPAP titration study will help with proper treatment settings and mask fitting, if needed. Alternative treatments include weight loss along with avoidance of the supine sleep position, or an oral appliance in appropriate candidates. Please note that untreated obstructive sleep apnea may carry additional perioperative morbidity. Patients with significant obstructive sleep apnea should receive perioperative PAP therapy and the surgeons and particularly the anesthesiologist should be informed of the diagnosis and the severity of the sleep disordered breathing. The patient should be cautioned not to drive, work at  heights, or operate dangerous or heavy equipment when tired or sleepy. Review and reiteration of good sleep hygiene measures should be pursued with any patient. Other causes of the patient's symptoms, including circadian rhythm disturbances, an underlying mood disorder, medication effect and/or an underlying medical problem cannot be ruled out based on this test. Clinical correlation is recommended. The patient and her referring provider will be notified of the test results. The patient will be seen in follow up in sleep clinic at [facility]. I certify that I have reviewed the raw data recording prior to the issuance of this report in accordance with the standards of the American Academy of Sleep Medicine (AASM).  Star Age, MD, PhD, [facility]) Diplomat, ABPN (Neurology and Sleep)

## 2018-05-19 ENCOUNTER — Encounter: Payer: Self-pay | Admitting: Emergency Medicine

## 2018-05-19 ENCOUNTER — Ambulatory Visit: Payer: Self-pay | Admitting: Emergency Medicine

## 2018-05-19 ENCOUNTER — Other Ambulatory Visit: Payer: Self-pay

## 2018-05-19 VITALS — BP 148/92 | HR 96 | Temp 98.7°F | Resp 18 | Ht 62.0 in | Wt 149.0 lb

## 2018-05-19 DIAGNOSIS — N390 Urinary tract infection, site not specified: Secondary | ICD-10-CM

## 2018-05-19 DIAGNOSIS — R350 Frequency of micturition: Secondary | ICD-10-CM

## 2018-05-19 HISTORY — DX: Urinary tract infection, site not specified: N39.0

## 2018-05-19 LAB — POCT URINALYSIS DIP (MANUAL ENTRY)
Blood, UA: NEGATIVE
Glucose, UA: 100 mg/dL — AB
Nitrite, UA: POSITIVE — AB
PH UA: 5 (ref 5.0–8.0)
Protein Ur, POC: 100 mg/dL — AB
Spec Grav, UA: 1.02 (ref 1.010–1.025)
Urobilinogen, UA: 2 E.U./dL — AB

## 2018-05-19 MED ORDER — PENTOSAN POLYSULFATE SODIUM 100 MG PO CAPS: 100.0000 mg | ORAL_CAPSULE | Freq: Three times a day (TID) | ORAL | 3 refills | Status: DC

## 2018-05-19 MED ORDER — SULFAMETHOXAZOLE-TRIMETHOPRIM 800-160 MG PO TABS: 1.0000 | ORAL_TABLET | Freq: Two times a day (BID) | ORAL | 0 refills | Status: AC

## 2018-05-19 MED ORDER — PHENAZOPYRIDINE HCL 200 MG PO TABS: 200.0000 mg | ORAL_TABLET | Freq: Three times a day (TID) | ORAL | 0 refills | Status: DC | PRN

## 2018-05-19 NOTE — Patient Instructions (Addendum)
     If you have lab work done today you will be contacted with your lab results within the next 2 weeks.  If you have not heard from [place] then please contact us. The fastest way to get your results is to register for My Chart.   IF you received an x-ray today, you will receive an invoice from [facility] Radiology. Please contact [facility] Radiology at [phone] with questions or concerns regarding your invoice.   IF you received labwork today, you will receive an invoice from [place]. Please contact [facility] at [phone] with questions or concerns regarding your invoice.   Our billing staff will not be able to assist you with questions regarding bills from these companies.  You will be contacted with the lab results as soon as they are available. The fastest way to get your results is to activate your My Chart account. Instructions are located on the last page of this paperwork. If you have not heard from [place] regarding the results in 2 weeks, please contact this office.    Multiple: Infeccin urinaria en los adultos (Urinary Tract Infection, Adult) Una infeccin urinaria (IU) puede ocurrir en Clinical cytogeneticist de las vas [place]. Las vas urinarias incluyen lo siguiente:  Riones.  Urteres.  Vejiga.  Uretra. Estos rganos fabrican, Buyer, retail y eliminan la orina del organismo. [place] los medicamentos de venta libre y los recetados solamente como se lo haya indicado el mdico.  Si le recetaron un antibitico, tmelo como se lo haya indicado el mdico. No deje de tomar los antibiticos aunque comience a sentirse mejor.  Evite beber lo siguiente: ? Alcohol. ? Cafena. ? T. ? Bebidas con gas.  Beba suficiente lquido para mantener el pis claro o de color amarillo plido.  Concurra a todas las visitas de control como se lo haya indicado el mdico. Esto es importante.  Asegrese de lo siguiente: ? Vaciar la vejiga con frecuencia y en su totalidad. No  contener la orina durante largos perodos. ? Vaciar la vejiga antes y despus de Clinical biochemist. ? Limpiar de adelante hacia atrs despus de defecar, si es mujer. Usar cada trozo de papel una vez cuando se limpie. SOLICITE AYUDA SI:  Siente dolor en la espalda.  Tiene fiebre.  Siente malestar estomacal (nuseas).  Vomita.  Los sntomas no mejoran despus de 3das de tratamiento.  Los sntomas desaparecen y Teacher, adult education. SOLICITE AYUDA DE INMEDIATO SI:  Siente un dolor muy intenso en la espalda.  Siente un dolor muy intenso en la parte inferior del abdomen.  Tiene vmitos y no puede retener los medicamentos ni el agua. Esta informacin no tiene Marine scientist el consejo del mdico. Asegrese de hacerle al mdico cualquier pregunta que tenga. Document Released: 11/12/2009 Document Revised: 09/16/2015 Document Reviewed: 04/15/2015 Elsevier Interactive Patient Education  [employer].

## 2018-05-19 NOTE — Progress Notes (Signed)
[name] [name] 52 y.o.   Chief Complaint  Patient presents with  . Dysuria    started sunday     HISTORY OF PRESENT ILLNESS: This is a 52 y.o. female complaining of UTI symptoms for the last 4 days.  Dysuria   This is a new problem. The current episode started in the past 7 days. The problem occurs every urination. The problem has been gradually worsening. The quality of the pain is described as burning. The pain is at a severity of 5/10. The pain is moderate. There has been no fever. There is no history of pyelonephritis. Associated symptoms include frequency and urgency. Pertinent negatives include no chills, discharge, flank pain, hematuria, nausea, sweats or vomiting. She has tried nothing for the symptoms. There is no history of catheterization, kidney stones, recurrent UTIs or a urological procedure.     Prior to Admission medications   Medication Sig Start Date End Date Taking? Authorizing Provider  amitriptyline (ELAVIL) 25 MG tablet Take 1 tablet (25 mg total) by mouth at bedtime. 04/27/18  Yes Rutherford Guys, MD  atorvastatin (LIPITOR) 20 MG tablet Take 1 tablet (20 mg total) by mouth daily. 04/27/18  Yes Rutherford Guys, MD  ibuprofen (ADVIL,MOTRIN) 600 MG tablet Take 1 tablet (600 mg total) by mouth every 8 (eight) hours as needed. 04/27/18  Yes Rutherford Guys, MD  lisinopril-hydrochlorothiazide (PRINZIDE,ZESTORETIC) 20-25 MG tablet Take 1 tablet by mouth daily. 04/27/18  Yes Rutherford Guys, MD  pantoprazole (PROTONIX) 20 MG tablet Take 1 tablet (20 mg total) by mouth daily. 04/27/18  Yes Rutherford Guys, MD    No Known Allergies  Patient Active Problem List   Diagnosis Date Noted  . Chronic migraine w/o aura w/o status migrainosus, not intractable 08/10/2017  . Medication overuse headache 08/10/2017  . Chest pain 07/16/2016  . Dysplasia of cervix, low grade (CIN 1) 10/30/2015  . Plantar fasciitis of right foot 10/05/2013  . Breast pain, left 11/15/2012  .  Essential hypertension, benign 07/25/2012  . Varicose veins 07/07/2011    Past Medical History:  Diagnosis Date  . Acid reflux   . Chest pain    09/15/2017 - normal stress echo  . Endometriosis   . Gastritis   . Hypertension   . Migraine   . Varicose veins     Past Surgical History:  Procedure Laterality Date  . CESAREAN SECTION     She had 5 C-Sections    Social History   Socioeconomic History  . Marital status: Married    Spouse name: Not on file  . Number of children: 5  . Years of education: Not on file  . Highest education level: Bachelor's degree (e.g., BA, AB, BS)  Occupational History  . Not on file  Social Needs  . Financial resource strain: Not on file  . Food insecurity:    Worry: Not on file    Inability: Not on file  . Transportation needs:    Medical: Not on file    Non-medical: Not on file  Tobacco Use  . Smoking status: Never Smoker  . Smokeless tobacco: Never Used  Substance and Sexual Activity  . Alcohol use: No    Alcohol/week: 0.0 standard drinks  . Drug use: No  . Sexual activity: Yes    Birth control/protection: None  Lifestyle  . Physical activity:    Days per week: 0 days    Minutes per session: 0 min  . Stress: Rather much  Relationships  .  Social connections:    Talks on phone: More than three times a week    Gets together: More than three times a week    Attends religious service: More than 4 times per year    Active member of club or organization: Yes    Attends meetings of clubs or organizations: More than 4 times per year    Relationship status: Married  . Intimate partner violence:    Fear of current or ex partner: No    Emotionally abused: No    Physically abused: No    Forced sexual activity: No  Other Topics Concern  . Not on file  Social History Narrative   Lives at home with her husband and children   Drinks 1 cup of decaf coffee daily, sometimes drinks soda, mostly water   Right handed    Family History    Problem Relation Age of Onset  . Stroke Father   . Hypertension Father   . Diabetes Father   . Hypertension Mother   . Thyroid disease Mother      Review of Systems  Constitutional: Negative.  Negative for chills.  HENT: Negative.  Negative for sore throat.   Eyes: Negative.   Respiratory: Negative.  Negative for cough and shortness of breath.   Cardiovascular: Negative.  Negative for chest pain and palpitations.  Gastrointestinal: Negative.  Negative for abdominal pain, diarrhea, nausea and vomiting.  Genitourinary: Positive for dysuria, frequency and urgency. Negative for flank pain and hematuria.  Skin: Negative.  Negative for rash.  Neurological: Negative.  Negative for dizziness and headaches.  All other systems reviewed and are negative.  Vitals:   05/19/18 1251  BP: (!) 148/92  Pulse: 96  Resp: 18  Temp: 98.7 F (37.1 C)  SpO2: 94%     Physical Exam Vitals signs reviewed.  Constitutional:      Appearance: Normal appearance.  HENT:     Head: Normocephalic and atraumatic.     Mouth/Throat:     Mouth: Mucous membranes are moist.  Eyes:     Extraocular Movements: Extraocular movements intact.     Pupils: Pupils are equal, round, and reactive to light.  Neck:     Musculoskeletal: Normal range of motion.  Cardiovascular:     Rate and Rhythm: Normal rate and regular rhythm.     Pulses: Normal pulses.  Pulmonary:     Effort: Pulmonary effort is normal.     Breath sounds: Normal breath sounds.  Abdominal:     General: Abdomen is flat. There is no distension.     Tenderness: There is no abdominal tenderness.  Musculoskeletal: Normal range of motion.  Skin:    General: Skin is warm and dry.     Capillary Refill: Capillary refill takes less than 2 seconds.  Neurological:     General: No focal deficit present.     Mental Status: She is alert and oriented to person, place, and time.  Psychiatric:        Mood and Affect: Mood normal.        Behavior: Behavior  normal.    Results for orders placed or performed in visit on 05/19/18 (from the past 24 hour(s))  POCT urinalysis dipstick     Status: Abnormal   Collection Time: 05/19/18 12:57 PM  Result Value Ref Range   Color, UA orange (A) yellow   Clarity, UA cloudy (A) clear   Glucose, UA =100 (A) negative mg/dL   Bilirubin, UA small (A) negative  Ketones, POC UA trace (5) (A) negative mg/dL   Spec Grav, UA 1.020 1.010 - 1.025   Blood, UA negative negative   pH, UA 5.0 5.0 - 8.0   Protein Ur, POC =100 (A) negative mg/dL   Urobilinogen, UA 2.0 (A) 0.2 or 1.0 E.U./dL   Nitrite, UA Positive (A) Negative   Leukocytes, UA Large (3+) (A) Negative     ASSESSMENT & PLAN: Thandiwe was seen today for dysuria.  Diagnoses and all orders for this visit:  Urinary frequency -     POCT urinalysis dipstick -     Urine Culture  Acute UTI  Other orders -     sulfamethoxazole-trimethoprim (BACTRIM DS,SEPTRA DS) 800-160 MG tablet; Take 1 tablet by mouth 2 (two) times daily for 7 days. -     phenazopyridine (PYRIDIUM) 200 MG tablet; Take 1 tablet (200 mg total) by mouth 3 (three) times daily as needed for pain. -     pentosan polysulfate (ELMIRON) 100 MG capsule; Take 1 capsule (100 mg total) by mouth 3 (three) times daily.    Patient Instructions       If you have lab work done today you will be contacted with your lab results within the next 2 weeks.  If you have not heard from [place] then please contact us. The fastest way to get your results is to register for My Chart.   IF you received an x-ray today, you will receive an invoice from [facility]. Please contact [facility] at [phone] with questions or concerns regarding your invoice.   IF you received labwork today, you will receive an invoice from [facility]. Please contact [facility] at [phone] with questions or concerns regarding your invoice.   Our billing staff will not be able to assist you with questions regarding  bills from these companies.  You will be contacted with the lab results as soon as they are available. The fastest way to get your results is to activate your My Chart account. Instructions are located on the last page of this paperwork. If you have not heard from [place] regarding the results in 2 weeks, please contact this office.    Multiple: Infeccin urinaria en los adultos (Urinary Tract Infection, Adult) Una infeccin urinaria (IU) puede ocurrir en Clinical cytogeneticist de las vas [place]. Las vas urinarias incluyen lo siguiente:  Riones.  Urteres.  Vejiga.  Uretra. Estos rganos fabrican, Buyer, retail y eliminan la orina del organismo. [place] los medicamentos de venta libre y los recetados solamente como se lo haya indicado el mdico.  Si le recetaron un antibitico, tmelo como se lo haya indicado el mdico. No deje de tomar los antibiticos aunque comience a sentirse mejor.  Evite beber lo siguiente: ? Alcohol. ? Cafena. ? T. ? Bebidas con gas.  Beba suficiente lquido para mantener el pis claro o de color amarillo plido.  Concurra a todas las visitas de control como se lo haya indicado el mdico. Esto es importante.  Asegrese de lo siguiente: ? Vaciar la vejiga con frecuencia y en su totalidad. No contener la orina durante largos perodos. ? Vaciar la vejiga antes y despus de Clinical biochemist. ? Limpiar de adelante hacia atrs despus de defecar, si es mujer. Usar cada trozo de papel una vez cuando se limpie. SOLICITE AYUDA SI:  Siente dolor en la espalda.  Tiene fiebre.  Siente malestar estomacal (nuseas).  Vomita.  Los sntomas no mejoran despus de 3das de tratamiento.  Los  sntomas desaparecen y luego reaparecen. SOLICITE AYUDA DE INMEDIATO SI:  Siente un dolor muy intenso en la espalda.  Siente un dolor muy intenso en la parte inferior del abdomen.  Tiene vmitos y no puede retener los medicamentos ni el agua. Esta  informacin no tiene Marine scientist el consejo del mdico. Asegrese de hacerle al mdico cualquier pregunta que tenga. Document Released: 11/12/2009 Document Revised: 09/16/2015 Document Reviewed: 04/15/2015 Elsevier Interactive Patient Education  2018 Elsevier Inc.      Agustina Caroli, MD [facility]

## 2018-06-24 ENCOUNTER — Other Ambulatory Visit: Payer: Self-pay

## 2018-06-24 ENCOUNTER — Ambulatory Visit: Payer: Self-pay | Admitting: Family Medicine

## 2018-06-24 ENCOUNTER — Encounter: Payer: Self-pay | Admitting: Family Medicine

## 2018-06-24 VITALS — BP 134/86 | HR 81 | Temp 98.5°F | Ht 62.0 in | Wt 150.0 lb

## 2018-06-24 DIAGNOSIS — K5900 Constipation, unspecified: Secondary | ICD-10-CM

## 2018-06-24 DIAGNOSIS — R1032 Left lower quadrant pain: Secondary | ICD-10-CM

## 2018-06-24 DIAGNOSIS — N390 Urinary tract infection, site not specified: Secondary | ICD-10-CM

## 2018-06-24 DIAGNOSIS — R300 Dysuria: Secondary | ICD-10-CM

## 2018-06-24 LAB — POC MICROSCOPIC URINALYSIS (UMFC): Mucus: ABSENT

## 2018-06-24 LAB — POCT URINALYSIS DIP (MANUAL ENTRY)
Bilirubin, UA: NEGATIVE
Blood, UA: NEGATIVE
Glucose, UA: NEGATIVE mg/dL
Ketones, POC UA: NEGATIVE mg/dL
Nitrite, UA: NEGATIVE
Protein Ur, POC: 30 mg/dL — AB
Spec Grav, UA: 1.02 (ref 1.010–1.025)
Urobilinogen, UA: 0.2 E.U./dL
pH, UA: 7 (ref 5.0–8.0)

## 2018-06-24 LAB — POCT WET + KOH PREP
Trich by wet prep: ABSENT
Yeast by KOH: ABSENT
Yeast by wet prep: ABSENT

## 2018-06-24 MED ORDER — CEPHALEXIN 500 MG PO CAPS: 500.0000 mg | ORAL_CAPSULE | Freq: Two times a day (BID) | ORAL | 0 refills | Status: DC

## 2018-06-24 MED ORDER — POLYETHYLENE GLYCOL 3350 17 GM/SCOOP PO POWD: 17.0000 g | Freq: Every day | ORAL | 1 refills | Status: DC

## 2018-06-24 NOTE — Progress Notes (Signed)
1/17/20201:55 PM  [name] [name] April 15, 1966, 53 y.o. female [id number]  Chief Complaint  Patient presents with  . Dysuria    REPEAT SYMPTOMS FROM 12/12/ visit. Feels tired all the time    HPI:   Patient is a 53 y.o. female with past medical history significant for HTN, migraines, mild OSA who presents today for dysuria  Seen 12/12 for similar concerns Treated with bactrim Culture ordered but not sent She reports that her sx did resolve with abx but a week ago, started with significant dysuria, no hematuria, urgency and frequency with low output No fever but has had chills No flank pain, nausea or vomiting She does suffer from constipation Denies any vaginal discharge or pelvic pain Menopause started 7 years ago Has been taking OTC azo  Fall Risk  06/24/2018 05/19/2018 04/27/2018 09/16/2017 08/05/2017  Falls in the past year? 0 0 0 No No     Depression screen [facility] 2/9 06/24/2018 05/19/2018 09/16/2017  Decreased Interest 0 0 0  Down, Depressed, Hopeless 0 0 0  PHQ - 2 Score 0 0 0    No Known Allergies  Prior to Admission medications   Medication Sig Start Date End Date Taking? Authorizing Provider  amitriptyline (ELAVIL) 25 MG tablet Take 1 tablet (25 mg total) by mouth at bedtime. 04/27/18  Yes Rutherford Guys, MD  atorvastatin (LIPITOR) 20 MG tablet Take 1 tablet (20 mg total) by mouth daily. 04/27/18  Yes Rutherford Guys, MD  ibuprofen (ADVIL,MOTRIN) 600 MG tablet Take 1 tablet (600 mg total) by mouth every 8 (eight) hours as needed. 04/27/18  Yes Rutherford Guys, MD  lisinopril-hydrochlorothiazide (PRINZIDE,ZESTORETIC) 20-25 MG tablet Take 1 tablet by mouth daily. 04/27/18  Yes Rutherford Guys, MD  pantoprazole (PROTONIX) 20 MG tablet Take 1 tablet (20 mg total) by mouth daily. 04/27/18  Yes Rutherford Guys, MD  pentosan polysulfate (ELMIRON) 100 MG capsule Take 1 capsule (100 mg total) by mouth 3 (three) times daily. 05/19/18 08/17/18 Yes Sagardia, Ines Bloomer, MD    phenazopyridine (PYRIDIUM) 200 MG tablet Take 1 tablet (200 mg total) by mouth 3 (three) times daily as needed for pain. 05/19/18  Yes Horald Pollen, MD    Past Medical History:  Diagnosis Date  . Acid reflux   . Chest pain    09/15/2017 - normal stress echo  . Endometriosis   . Gastritis   . Hypertension   . Migraine   . Varicose veins     Past Surgical History:  Procedure Laterality Date  . CESAREAN SECTION     She had 5 C-Sections    Social History   Tobacco Use  . Smoking status: Never Smoker  . Smokeless tobacco: Never Used  Substance Use Topics  . Alcohol use: No    Alcohol/week: 0.0 standard drinks    Family History  Problem Relation Age of Onset  . Stroke Father   . Hypertension Father   . Diabetes Father   . Hypertension Mother   . Thyroid disease Mother     ROS Per hpi  OBJECTIVE:  Blood pressure 134/86, pulse 81, temperature 98.5 F (36.9 C), temperature source Oral, height 5\' 2"  (1.575 m), weight 150 lb (68 kg), last menstrual period 05/12/2012, SpO2 100 %. Body mass index is 27.44 kg/m.   Physical Exam Vitals signs and nursing note reviewed. Exam conducted with a chaperone present.  Constitutional:      Appearance: She is well-developed.  HENT:  Head: Normocephalic and atraumatic.     Mouth/Throat:     Pharynx: No oropharyngeal exudate.  Eyes:     General: No scleral icterus.    Conjunctiva/sclera: Conjunctivae normal.     Pupils: Pupils are equal, round, and reactive to light.  Neck:     Musculoskeletal: Neck supple.  Cardiovascular:     Rate and Rhythm: Normal rate and regular rhythm.     Heart sounds: Normal heart sounds. No murmur. No friction rub. No gallop.   Pulmonary:     Effort: Pulmonary effort is normal.     Breath sounds: Normal breath sounds. No wheezing, rhonchi or rales.  Abdominal:     General: Bowel sounds are normal. There is no distension.     Palpations: Abdomen is soft.     Tenderness: There is  abdominal tenderness (RLQ and LLQ). There is no right CVA tenderness, left CVA tenderness, guarding or rebound.  Genitourinary:    General: Normal vulva.     Labia:        Right: No rash or lesion.        Left: No rash or lesion.      Urethra: No urethral swelling or urethral lesion.     Vagina: No vaginal discharge, erythema or lesions.     Cervix: No cervical motion tenderness, friability or erythema.     Uterus: Not enlarged, not fixed and not tender.      Adnexa:        Right: No mass or tenderness.         Left: Tenderness present. No mass or fullness.    Skin:    General: Skin is warm and dry.  Neurological:     Mental Status: She is alert and oriented to person, place, and time.     Results for orders placed or performed in visit on 06/24/18 (from the past 24 hour(s))  POCT urinalysis dipstick     Status: Abnormal   Collection Time: 06/24/18  2:01 PM  Result Value Ref Range   Color, UA yellow yellow   Clarity, UA cloudy (A) clear   Glucose, UA negative negative mg/dL   Bilirubin, UA negative negative   Ketones, POC UA negative negative mg/dL   Spec Grav, UA 1.020 1.010 - 1.025   Blood, UA negative negative   pH, UA 7.0 5.0 - 8.0   Protein Ur, POC =30 (A) negative mg/dL   Urobilinogen, UA 0.2 0.2 or 1.0 E.U./dL   Nitrite, UA Negative Negative   Leukocytes, UA Small (1+) (A) Negative  POCT Microscopic Urinalysis ([facility])     Status: Abnormal   Collection Time: 06/24/18  2:21 PM  Result Value Ref Range   WBC,UR,HPF,POC Moderate (A) None WBC/hpf   RBC,UR,HPF,POC None None RBC/hpf   Bacteria Many (A) None, Too numerous to count   Mucus Absent Absent   Epithelial Cells, UR Per Microscopy Few (A) None, Too numerous to count cells/hpf  POCT Wet + KOH Prep     Status: Abnormal   Collection Time: 06/24/18  2:28 PM  Result Value Ref Range   Yeast by KOH Absent Absent   Yeast by wet prep Absent Absent   WBC by wet prep Few (A) Few   Clue Cells Wet Prep HPF POC None (A) None    Trich by wet prep Absent Absent   Bacteria Wet Prep HPF POC Many (A) Few   Epithelial Cells By [employer] Pref ([facility]) Many (A) None, Few, Too numerous to count  RBC,UR,HPF,POC None None RBC/hpf      ASSESSMENT and PLAN  1. Recurrent UTI Micro suggestive of UTI. Treating with keflex. Sending ur cx. discussed management of constipation. If LLQ pain persists after treatment, will order pelvic US 2. Dysuria - POCT urinalysis dipstick - POCT Microscopic Urinalysis ([facility]) - Urine Culture - POCT Wet + KOH Prep  3. Abdominal pain, left lower quadrant - POCT Wet + KOH Prep  4. Constipation, unspecified constipation type  Other orders - polyethylene glycol powder (GLYCOLAX/MIRALAX) powder; Take 17 g by mouth daily. - cephALEXin (KEFLEX) 500 MG capsule; Take 1 capsule (500 mg total) by mouth 2 (two) times daily.    Return if symptoms worsen or fail to improve.    Rutherford Guys, MD [facility] [place], [place] [address] Ph.  [phone] Fax [phone]

## 2018-06-24 NOTE — Patient Instructions (Signed)
° ° ° °  If you have lab work done today you will be contacted with your lab results within the next 2 weeks.  If you have not heard from us then please contact us. The fastest way to get your results is to register for My Chart. ° ° °IF you received an x-ray today, you will receive an invoice from [facility]. Please contact [facility] at [phone] with questions or concerns regarding your invoice.  ° °IF you received labwork today, you will receive an invoice from [facility]. Please contact [facility] at [phone] with questions or concerns regarding your invoice.  ° °Our billing staff will not be able to assist you with questions regarding bills from these companies. ° °You will be contacted with the lab results as soon as they are available. The fastest way to get your results is to activate your My Chart account. Instructions are located on the last page of this paperwork. If you have not heard from us regarding the results in 2 weeks, please contact this office. °  ° ° ° °

## 2018-06-27 LAB — URINE CULTURE

## 2018-07-05 ENCOUNTER — Encounter (HOSPITAL_COMMUNITY): Payer: Self-pay | Admitting: *Deleted

## 2018-08-08 NOTE — Progress Notes (Deleted)
[facility]  PATIENT: [name] [name] DOB: 09-Jan-1966   REASON FOR VISIT: Follow-up for newly diagnosed obstructive sleep apnea here for initial AutoPap compliance HISTORY FROM:    HISTORY OF PRESENT ILLNESS: 11/14/19SAMs. [name] is a 53year-old right-handed woman with an underlying medical history of migraine headaches, history of gastritis, endometriosis, chest pain, reflux disease, hypertension, and borderline overweight state, who reports sleep disruption, difficulty with sleep maintenance, nonrestorative sleep and has had morning headaches. I reviewed your office note from 08/09/2017. She may have some snoring but is not sure. Her husband does not seem to complain about it. She does have sleep disruption from nocturia, about 3 times per average night. She is a nonsmoker and does not utilize alcohol or caffeine on a regular basis. Her bedtime is between 10 and 11 and rise time between 6 and 7. She works as a Scientist, water quality at [employer]. Her Epworth sleepiness score is 8 out of 24, fatigue score is 47 out of 63. She admits that she sleeps a little better with amitriptyline that you prescribed for HAs, but also reports that she does not take it every night.   REVIEW OF SYSTEMS: Full 14 system review of systems performed and notable only for those listed, all others are neg:  Constitutional: neg  Cardiovascular: neg Ear/Nose/Throat: neg  Skin: neg Eyes: neg Respiratory: neg Gastroitestinal: neg  Hematology/Lymphatic: neg  Endocrine: neg Musculoskeletal:neg Allergy/Immunology: neg Neurological: neg Psychiatric: neg Sleep : neg   ALLERGIES: No Known Allergies  HOME MEDICATIONS: Outpatient Medications Prior to Visit  Medication Sig Dispense Refill  . amitriptyline (ELAVIL) 25 MG tablet Take 1 tablet (25 mg total) by mouth at bedtime. 90 tablet 3  . atorvastatin (LIPITOR) 20 MG tablet Take 1 tablet (20 mg total) by mouth daily. 90 tablet 3  . cephALEXin (KEFLEX)  500 MG capsule Take 1 capsule (500 mg total) by mouth 2 (two) times daily. 14 capsule 0  . ibuprofen (ADVIL,MOTRIN) 600 MG tablet Take 1 tablet (600 mg total) by mouth every 8 (eight) hours as needed. 30 tablet 0  . lisinopril-hydrochlorothiazide (PRINZIDE,ZESTORETIC) 20-25 MG tablet Take 1 tablet by mouth daily. 90 tablet 3  . polyethylene glycol powder (GLYCOLAX/MIRALAX) powder Take 17 g by mouth daily. 225 g 1   No facility-administered medications prior to visit.     PAST MEDICAL HISTORY: Past Medical History:  Diagnosis Date  . Acid reflux   . Chest pain    09/15/2017 - normal stress echo  . Endometriosis   . Gastritis   . Hypertension   . Migraine   . Varicose veins     PAST SURGICAL HISTORY: Past Surgical History:  Procedure Laterality Date  . CESAREAN SECTION     She had 5 C-Sections    FAMILY HISTORY: Family History  Problem Relation Age of Onset  . Stroke Father   . Hypertension Father   . Diabetes Father   . Hypertension Mother   . Thyroid disease Mother     SOCIAL HISTORY: Social History   Socioeconomic History  . Marital status: Married    Spouse name: Not on file  . Number of children: 5  . Years of education: Not on file  . Highest education level: Bachelor's degree (e.g., BA, AB, BS)  Occupational History  . Not on file  Social Needs  . Financial resource strain: Not on file  . Food insecurity:    Worry: Not on file    Inability: Not on file  .  Transportation needs:    Medical: Not on file    Non-medical: Not on file  Tobacco Use  . Smoking status: Never Smoker  . Smokeless tobacco: Never Used  Substance and Sexual Activity  . Alcohol use: No    Alcohol/week: 0.0 standard drinks  . Drug use: No  . Sexual activity: Yes    Birth control/protection: None  Lifestyle  . Physical activity:    Days per week: 0 days    Minutes per session: 0 min  . Stress: Rather much  Relationships  . Social connections:    Talks on phone: More than  three times a week    Gets together: More than three times a week    Attends religious service: More than 4 times per year    Active member of club or organization: Yes    Attends meetings of clubs or organizations: More than 4 times per year    Relationship status: Married  . Intimate partner violence:    Fear of current or ex partner: No    Emotionally abused: No    Physically abused: No    Forced sexual activity: No  Other Topics Concern  . Not on file  Social History Narrative   Lives at home with her husband and children   Drinks 1 cup of decaf coffee daily, sometimes drinks soda, mostly water   Right handed     PHYSICAL EXAM  There were no vitals filed for this visit. There is no height or weight on file to calculate BMI.  Generalized: Well developed, in no acute distress  Head: normocephalic and atraumatic,. Oropharynx benign  Neck: Supple, no carotid bruits  Cardiac: Regular rate rhythm, no murmur  Musculoskeletal: No deformity   Neurological examination   Mentation: Alert oriented to time, place, history taking. Attention span and concentration appropriate. Recent and remote memory intact.  Follows all commands speech and language fluent.   Cranial nerve II-XII: Fundoscopic exam reveals sharp disc margins.Pupils were equal round reactive to light extraocular movements were full, visual field were full on confrontational test. Facial sensation and strength were normal. hearing was intact to finger rubbing bilaterally. Uvula tongue midline. head turning and shoulder shrug were normal and symmetric.Tongue protrusion into cheek strength was normal. Motor: normal bulk and tone, full strength in the BUE, BLE, fine finger movements normal, no pronator drift. No focal weakness Sensory: normal and symmetric to light touch, pinprick, and  Vibration, proprioception  Coordination: finger-nose-finger, heel-to-shin bilaterally, no dysmetria Reflexes: Brachioradialis 2/2, biceps 2/2,  triceps 2/2, patellar 2/2, Achilles 2/2, plantar responses were flexor bilaterally. Gait and Station: Rising up from seated position without assistance, normal stance,  moderate stride, good arm swing, smooth turning, able to perform tiptoe, and heel walking without difficulty. Tandem gait is steady  DIAGNOSTIC DATA (LABS, IMAGING, TESTING) - I reviewed patient records, labs, notes, testing and imaging myself where available.  Lab Results  Component Value Date   WBC 4.8 02/14/2018   HGB 13.7 02/14/2018   HCT 41.4 02/14/2018   MCV 84 02/14/2018   PLT 280 02/14/2018      Component Value Date/Time   NA 142 04/27/2018 1043   K 4.1 04/27/2018 1043   CL 101 04/27/2018 1043   CO2 25 04/27/2018 1043   GLUCOSE 95 04/27/2018 1043   GLUCOSE 117 (H) 02/03/2016 1451   BUN 11 04/27/2018 1043   CREATININE 0.78 04/27/2018 1043   CREATININE 0.69 02/03/2016 1451   CALCIUM 10.1 04/27/2018 1043  PROT 7.6 04/27/2018 1043   ALBUMIN 4.7 04/27/2018 1043   AST 26 04/27/2018 1043   ALT 25 04/27/2018 1043   ALKPHOS 110 04/27/2018 1043   BILITOT 0.6 04/27/2018 1043   GFRNONAA 88 04/27/2018 1043   GFRNONAA >89 02/03/2016 1451   GFRAA 102 04/27/2018 1043   GFRAA >89 02/03/2016 1451   Lab Results  Component Value Date   CHOL 169 04/27/2018   HDL 41 04/27/2018   LDLCALC 93 04/27/2018   TRIG 175 (H) 04/27/2018   CHOLHDL 4.1 04/27/2018   Lab Results  Component Value Date   HGBA1C 5.8 (H) 07/01/2016   Lab Results  Component Value Date   VITAMINB12 >2000 (H) 02/14/2018   Lab Results  Component Value Date   TSH 2.940 02/14/2018    ***  ASSESSMENT AND PLAN  53 y.o. year old female  has a past medical history of Acid reflux, Chest pain, Endometriosis, Gastritis, Hypertension, Migraine, and Varicose veins. here with ***  Dorethia P Hamblin is a 53 y.o.-year old female with an underlying medical history of migraine headaches, history of gastritis, endometriosis, chest pain, reflux disease,  hypertension, and borderline overweight state, whose history and physical exam raise some concern for obstructive sleep apnea (OSA). I had a long chat with the patient about my findings and the diagnosis of OSA, its prognosis and treatment options. We talked about medical treatments, surgical interventions and non-pharmacological approaches. I explained in particular the risks and ramifications of untreated moderate to severe OSA, especially with respect to developing cardiovascular disease down the Road, including congestive heart failure, difficult to treat hypertension, cardiac arrhythmias, or stroke. Even type 2 diabetes has, in part, been linked to untreated OSA. Symptoms of untreated OSA include daytime sleepiness, memory   Dennie Bible, [facility], [facility], APRN  [facility] [address], [place], [place] [address] [phone]

## 2018-08-09 ENCOUNTER — Ambulatory Visit: Payer: Self-pay | Admitting: Nurse Practitioner

## 2018-08-09 ENCOUNTER — Telehealth: Payer: Self-pay | Admitting: *Deleted

## 2018-08-09 NOTE — Telephone Encounter (Signed)
I called pt, then called [employer] , Verdene Lennert [phone], about her using auto pap (since no record).  Pt was not aware of appt, is not using autopap, (she stated that was too expensive for 9 month usage (renting for this), she thought she would only be using for 4 months.  ([place] would not rent for 4 months).  I cancelled appt today, and will then call her back to reschedule after speaking with [facility] and Dr. Rexene Alberts.

## 2018-10-20 ENCOUNTER — Telehealth: Payer: Self-pay

## 2018-10-20 NOTE — Telephone Encounter (Signed)
Unable to get in contact with the patient to convert their office visit with [facility] on 10/26/2018 into a doxy.me visit. I left a voicemail asking the patient to return my call. Office number was provided.   If patient calls back please convert their office visit into a doxy.me visit.

## 2018-10-26 ENCOUNTER — Ambulatory Visit: Payer: Self-pay | Admitting: Adult Health

## 2018-10-26 NOTE — Telephone Encounter (Signed)
For mild obstructive sleep apnea, she can consider a dental device.  She can either talk to her own dentist about treatment for sleep apnea or we can make a referral, there are several practices in this area where we refer to for consideration of a dental device for treatment of sleep apnea. Please advise patient that we can put a referral into dentistry or if she wants to talk to her own dentist first that would be fine as well.

## 2018-10-26 NOTE — Telephone Encounter (Signed)
Attempted to call pt via [employer]   Elta [place] [phone].  No answer at home. Will try later.

## 2018-10-26 NOTE — Telephone Encounter (Signed)
Using [employer], [place] James, [place] attempted to call pt.  Did not LM for her.  Will call back another time.

## 2018-10-26 NOTE — Telephone Encounter (Addendum)
I noticed that I  failed to send this message to Dr. Rexene Alberts   I did community message this am to confirm with [facility].   She has appt scheduled today so will discuss with her then.  Pt did not show for her appt today with MM/NP.  Do you have any recommendations for his pt on her autopap (is she cannot afford)?

## 2018-10-27 NOTE — Telephone Encounter (Signed)
I called pt and [employer], [name] [name] interpreter ID [phone].  He LMVM for pt at her mobile # concerning the dental device, referral if wanted.  LMVM for pt to return call if questions.

## 2018-11-04 ENCOUNTER — Telehealth: Payer: Self-pay | Admitting: Family Medicine

## 2018-11-17 ENCOUNTER — Encounter (HOSPITAL_COMMUNITY): Payer: Self-pay | Admitting: Emergency Medicine

## 2018-11-17 ENCOUNTER — Emergency Department (HOSPITAL_COMMUNITY): Payer: Self-pay

## 2018-11-17 ENCOUNTER — Other Ambulatory Visit: Payer: Self-pay

## 2018-11-17 ENCOUNTER — Ambulatory Visit (HOSPITAL_COMMUNITY): Admission: EM | Admit: 2018-11-17 | Discharge: 2018-11-17 | Discharge status: Absent without leave | Payer: Self-pay

## 2018-11-17 ENCOUNTER — Emergency Department (HOSPITAL_COMMUNITY)
Admission: EM | Admit: 2018-11-17 | Discharge: 2018-11-18 | Discharge status: Against Medical Advice | Payer: Self-pay | Attending: Emergency Medicine | Admitting: Emergency Medicine

## 2018-11-17 DIAGNOSIS — Z79899 Other long term (current) drug therapy: Secondary | ICD-10-CM | POA: Insufficient documentation

## 2018-11-17 DIAGNOSIS — Z5320 Procedure and treatment not carried out because of patient's decision for unspecified reasons: Secondary | ICD-10-CM | POA: Insufficient documentation

## 2018-11-17 DIAGNOSIS — R072 Precordial pain: Secondary | ICD-10-CM | POA: Insufficient documentation

## 2018-11-17 DIAGNOSIS — I10 Essential (primary) hypertension: Secondary | ICD-10-CM | POA: Insufficient documentation

## 2018-11-17 LAB — CBC
HCT: 42.7 % (ref 36.0–46.0)
Hemoglobin: 14.1 g/dL (ref 12.0–15.0)
MCH: 27.8 pg (ref 26.0–34.0)
MCHC: 33 g/dL (ref 30.0–36.0)
MCV: 84.2 fL (ref 80.0–100.0)
Platelets: 251 10*3/uL (ref 150–400)
RBC: 5.07 MIL/uL (ref 3.87–5.11)
RDW: 13 % (ref 11.5–15.5)
WBC: 5 10*3/uL (ref 4.0–10.5)
nRBC: 0 % (ref 0.0–0.2)

## 2018-11-17 LAB — I-STAT BETA HCG BLOOD, ED (MC, WL, AP ONLY): I-stat hCG, quantitative: <5 m[IU]/mL (ref <5)

## 2018-11-17 LAB — BASIC METABOLIC PANEL
Anion gap: 10 (ref 5–15)
BUN: 14 mg/dL (ref 6–20)
CO2: 24 mmol/L (ref 22–32)
Calcium: 9.5 mg/dL (ref 8.9–10.3)
Chloride: 105 mmol/L (ref 98–111)
Creatinine, Ser: 0.69 mg/dL (ref 0.44–1.00)
GFR calc Af Amer: >60 mL/min (ref >60)
GFR calc non Af Amer: >60 mL/min (ref >60)
Glucose, Bld: 124 mg/dL — ABNORMAL HIGH (ref 70–99)
Potassium: 3.9 mmol/L (ref 3.5–5.1)
Sodium: 139 mmol/L (ref 135–145)

## 2018-11-17 LAB — TROPONIN I
Troponin I: <0.03 ng/mL (ref <0.03)
Troponin I: <0.03 ng/mL (ref <0.03)

## 2018-11-17 MED ORDER — ASPIRIN 81 MG PO CHEW
324.0000 mg | CHEWABLE_TABLET | Freq: Once | ORAL | Status: AC
  Administered 2018-11-17: 324 mg via ORAL
  Filled 2018-11-17: qty 4

## 2018-11-17 MED ORDER — NITROGLYCERIN 0.4 MG SL SUBL
0.4000 mg | SUBLINGUAL_TABLET | SUBLINGUAL | Status: DC | PRN
  Administered 2018-11-17: 0.4 mg via SUBLINGUAL
  Filled 2018-11-17: qty 1

## 2018-11-17 MED ORDER — SODIUM CHLORIDE 0.9% FLUSH: 3.0000 mL | Freq: Once | INTRAVENOUS | Status: DC

## 2018-11-17 NOTE — ED Triage Notes (Signed)
Pt in POV reporting sudden onset of CP/SOB 1hr PTA. Pt states CP is L sided, non radiating, tight/squeezing in nature. Pt tearful in triage.

## 2018-11-17 NOTE — ED Notes (Signed)
Pt walked to bathroom with no problem

## 2018-11-17 NOTE — Discharge Instructions (Addendum)

## 2018-11-17 NOTE — ED Notes (Signed)
Family members continue to come to lobby requesting to go back and visit with patient. Visitor policy had been explained twice to patient and was explained 3 times to family members and they continue to ask. Pt is able to keep her family updated via telephone.

## 2018-11-17 NOTE — ED Provider Notes (Addendum)
[facility] EMERGENCY DEPARTMENT Provider Note   CSN: [id number] Arrival date & time: 11/17/18  1901    History   Chief Complaint Chief Complaint  Patient presents with  . Shortness of Breath    HPI [name] [name] is a 53 y.o. female.  HPI: A 53 year old patient with a history of hypertension and hypercholesterolemia presents for evaluation of chest pain. Initial onset of pain was approximately 1-3 hours ago. The patient's chest pain is sharp and is not worse with exertion. The patient's chest pain is middle- or left-sided, is not well-localized, is not described as heaviness/pressure/tightness and does radiate to the arms/jaw/neck. The patient does not complain of nausea and denies diaphoresis. The patient has a family history of coronary artery disease in a first-degree relative with onset less than age 78. The patient has no history of stroke, has no history of peripheral artery disease, has not smoked in the past 90 days, denies any history of treated diabetes and does not have an elevated BMI (>=30).     HPI  Past Medical History:  Diagnosis Date  . Acid reflux   . Chest pain    09/15/2017 - normal stress echo  . Endometriosis   . Gastritis   . Hypertension   . Migraine   . Varicose veins     Patient Active Problem List   Diagnosis Date Noted  . Urinary frequency 05/19/2018  . Acute UTI 05/19/2018  . Chronic migraine w/o aura w/o status migrainosus, not intractable 08/10/2017  . Medication overuse headache 08/10/2017  . Chest pain 07/16/2016  . Dysplasia of cervix, low grade (CIN 1) 10/30/2015  . Plantar fasciitis of right foot 10/05/2013  . Breast pain, left 11/15/2012  . Essential hypertension, benign 07/25/2012  . Varicose veins 07/07/2011    Past Surgical History:  Procedure Laterality Date  . CESAREAN SECTION     She had 5 C-Sections     OB History    Gravida  5   Para  5   Term  5   Preterm      AB      Living  5     SAB      TAB      Ectopic      Multiple      Live Births               Home Medications    Prior to Admission medications   Medication Sig Start Date End Date Taking? Authorizing Provider  atorvastatin (LIPITOR) 20 MG tablet Take 1 tablet (20 mg total) by mouth daily. 04/27/18  Yes Rutherford Guys, MD  lisinopril-hydrochlorothiazide (PRINZIDE,ZESTORETIC) 20-25 MG tablet Take 1 tablet by mouth daily. 04/27/18  Yes Rutherford Guys, MD  amitriptyline (ELAVIL) 25 MG tablet Take 1 tablet (25 mg total) by mouth at bedtime. Patient not taking: Reported on 11/17/2018 04/27/18   Rutherford Guys, MD  cephALEXin (KEFLEX) 500 MG capsule Take 1 capsule (500 mg total) by mouth 2 (two) times daily. Patient not taking: Reported on 11/17/2018 06/24/18   Rutherford Guys, MD  ibuprofen (ADVIL,MOTRIN) 600 MG tablet Take 1 tablet (600 mg total) by mouth every 8 (eight) hours as needed. Patient not taking: Reported on 11/17/2018 04/27/18   Rutherford Guys, MD  polyethylene glycol powder (GLYCOLAX/MIRALAX) powder Take 17 g by mouth daily. Patient not taking: Reported on 11/17/2018 06/24/18   Rutherford Guys, MD    Family History Family History  Problem  Relation Age of Onset  . Stroke Father   . Hypertension Father   . Diabetes Father   . Hypertension Mother   . Thyroid disease Mother     Social History Social History   Tobacco Use  . Smoking status: Never Smoker  . Smokeless tobacco: Never Used  Substance Use Topics  . Alcohol use: No    Alcohol/week: 0.0 standard drinks  . Drug use: No     Allergies   Patient has no known allergies.   Review of Systems Review of Systems  Constitutional: Positive for activity change.  Respiratory: Positive for chest tightness and shortness of breath.   Cardiovascular: Positive for chest pain.  Gastrointestinal: Negative for nausea.  Allergic/Immunologic: Negative for immunocompromised state.  All other systems reviewed and are negative.     Physical Exam Updated Vital Signs BP 104/70   Pulse 65   Temp 98.1 F (36.7 C) (Oral)   Resp 20   Ht 5\' 4"  (1.626 m)   Wt 68 kg   LMP 05/12/2012   SpO2 96%   BMI 25.75 kg/m   Physical Exam Vitals signs and nursing note reviewed.  Constitutional:      Appearance: She is well-developed.  HENT:     Head: Normocephalic and atraumatic.  Neck:     Musculoskeletal: Normal range of motion and neck supple.  Cardiovascular:     Rate and Rhythm: Normal rate.  Pulmonary:     Effort: Pulmonary effort is normal.     Comments: 2+ and equal radial pulse bilaterally Abdominal:     General: Bowel sounds are normal.  Musculoskeletal:     Right lower leg: No edema.     Left lower leg: No edema.  Skin:    General: Skin is warm and dry.  Neurological:     Mental Status: She is alert and oriented to person, place, and time.      ED Treatments / Results  Labs (all labs ordered are listed, but only abnormal results are displayed) Labs Reviewed  BASIC METABOLIC PANEL - Abnormal; Notable for the following components:      Result Value   Glucose, Bld 124 (*)    All other components within normal limits  CBC  TROPONIN I  TROPONIN I  I-STAT BETA HCG BLOOD, ED ([facility], [facility], [facility] ONLY)    EKG EKG Interpretation  Date/Time:  Thursday November 17 2018 19:08:26 EDT Ventricular Rate:  92 PR Interval:  166 QRS Duration: 70 QT Interval:  362 QTC Calculation: 447 R Axis:   10 Text Interpretation:  Sinus rhythm with occasional and consecutive Premature ventricular complexes Abnormal ECG No acute changes No significant change since last tracing Confirmed by Varney Biles [phone]) on 11/17/2018 7:32:06 PM    EKG Interpretation  Date/Time:  Thursday November 17 2018 23:33:48 EDT Ventricular Rate:  66 PR Interval:  166 QRS Duration: 84 QT Interval:  422 QTC Calculation: 443 R Axis:   21 Text Interpretation:  Sinus rhythm Low voltage, precordial leads When compared with ECG of EARLIER SAME DATE No  significant change was found Confirmed by Delora Fuel ([id number]) on 11/17/2018 11:42:42 PM        Radiology Dg Chest 2 View  Result Date: 11/17/2018 CLINICAL DATA:  Shortness of breath and chest pain EXAM: CHEST - 2 VIEW COMPARISON:  10/18/2011 FINDINGS: The heart size and mediastinal contours are within normal limits. Both lungs are clear. The visualized skeletal structures are unremarkable. IMPRESSION: No active cardiopulmonary disease. Electronically Signed  By: Constance Holster M.D.   On: 11/17/2018 20:03    Procedures Procedures (including critical care time)  Medications Ordered in ED Medications  sodium chloride flush (NS) 0.9 % injection 3 mL (3 mLs Intravenous Not Given 11/17/18 2116)  nitroGLYCERIN (NITROSTAT) SL tablet 0.4 mg (0.4 mg Sublingual Given 11/17/18 2117)  aspirin chewable tablet 324 mg (324 mg Oral Given 11/17/18 2116)     Initial Impression / Assessment and Plan / ED Course  I have reviewed the triage vital signs and the nursing notes.  Pertinent labs & imaging results that were available during my care of the patient were reviewed by me and considered in my medical decision making (see chart for details).     HEAR Score: 3 Differential diagnosis includes: ACS syndrome Aortic dissection CHF exacerbation Myocarditis Pericarditis Pericardial effusion / tamponade PE Pneumothorax Musculoskeletal pain PUD / Gastritis / Esophagitis Esophageal spasm  Pt comes in with cc of chest pain. She report sudden onset of Chest pain, L sided, radiation down her L arm, and she had associated dib. Pt has family hx of CAD, several member on the dad's side of the family have died of MI.  Her pain is improved, but not resolved.  Patient's here score is 3.  Initial EKG is not showing any signs of ischemia and the first troponin is negative.  We advised patient that she needs to be admitted to the hospital, especially after nitroglycerin improved her pain.  She continues  to have about 2 out of 10 pain right now.  Patient states that she would prefer going home.  She is unable to provide [place] with a solid reason behind her thinking, just stating that she would come back if her symptoms got worse again.  Patient wants to leave against medical advice. Patient understands that her actions will lead to inadequate medical workup, and that he/she is at risk of complications of missed diagnosis, which includes morbidity and mortality.  Alternative options discussed -observation admission for serial troponins. Opportunity to change mind given Discussion witnessed by phlebotomist. Patient is demonstrating good capacity to make decision. Patient understands that he/she needs to return to the ER immediately if his/her symptoms get worse.  Second troponin is pending right now.  If the troponin stays negative then we will proceed with [facility] discharge.  Patient has agreed to stay in the hospital if her troponin is elevated.  Final Clinical Impressions(s) / ED Diagnoses   Final diagnoses:  Precordial chest pain    ED Discharge Orders    None       Varney Biles, MD 11/17/18 2332    Varney Biles, MD 11/17/18 2343

## 2018-11-18 NOTE — ED Notes (Signed)
Patient verbalizes understanding of discharge instructions. Opportunity for questioning and answers were provided. Armband removed by staff, pt discharged from ED.  

## 2018-12-29 ENCOUNTER — Other Ambulatory Visit: Payer: Self-pay

## 2018-12-29 ENCOUNTER — Encounter: Payer: Self-pay | Admitting: Family Medicine

## 2018-12-29 ENCOUNTER — Telehealth: Payer: Self-pay | Admitting: Neurology

## 2018-12-29 ENCOUNTER — Ambulatory Visit (INDEPENDENT_AMBULATORY_CARE_PROVIDER_SITE_OTHER): Payer: Self-pay | Admitting: Family Medicine

## 2018-12-29 VITALS — BP 131/74 | HR 80 | Temp 98.8°F | Ht 62.0 in | Wt 155.8 lb

## 2018-12-29 DIAGNOSIS — F329 Major depressive disorder, single episode, unspecified: Secondary | ICD-10-CM

## 2018-12-29 DIAGNOSIS — G4733 Obstructive sleep apnea (adult) (pediatric): Secondary | ICD-10-CM

## 2018-12-29 DIAGNOSIS — F32A Depression, unspecified: Secondary | ICD-10-CM | POA: Insufficient documentation

## 2018-12-29 DIAGNOSIS — R0609 Other forms of dyspnea: Secondary | ICD-10-CM

## 2018-12-29 DIAGNOSIS — E785 Hyperlipidemia, unspecified: Secondary | ICD-10-CM | POA: Insufficient documentation

## 2018-12-29 DIAGNOSIS — Z8249 Family history of ischemic heart disease and other diseases of the circulatory system: Secondary | ICD-10-CM | POA: Insufficient documentation

## 2018-12-29 DIAGNOSIS — R072 Precordial pain: Secondary | ICD-10-CM

## 2018-12-29 DIAGNOSIS — I10 Essential (primary) hypertension: Secondary | ICD-10-CM

## 2018-12-29 HISTORY — DX: Obstructive sleep apnea (adult) (pediatric): G47.33

## 2018-12-29 MED ORDER — AMITRIPTYLINE HCL 25 MG PO TABS: 25.0000 mg | ORAL_TABLET | Freq: Every day | ORAL | 3 refills | Status: DC

## 2018-12-29 MED ORDER — LISINOPRIL-HYDROCHLOROTHIAZIDE 20-25 MG PO TABS: 1.0000 | ORAL_TABLET | Freq: Every day | ORAL | 3 refills | Status: DC

## 2018-12-29 MED ORDER — SERTRALINE HCL 25 MG PO TABS: 25.0000 mg | ORAL_TABLET | Freq: Every day | ORAL | 2 refills | Status: DC

## 2018-12-29 MED ORDER — ATORVASTATIN CALCIUM 20 MG PO TABS: 20.0000 mg | ORAL_TABLET | Freq: Every day | ORAL | 3 refills | Status: DC

## 2018-12-29 NOTE — Patient Instructions (Addendum)
  [facility], [place], [place] [address] Phone: [phone]   If you have lab work done today you will be contacted with your lab results within the next 2 weeks.  If you have not heard from [place] then please contact us. The fastest way to get your results is to register for My Chart.   IF you received an x-ray today, you will receive an invoice from [facility]. Please contact [facility] at [phone] with questions or concerns regarding your invoice.   IF you received labwork today, you will receive an invoice from [facility]. Please contact [facility] at [phone] with questions or concerns regarding your invoice.   Our billing staff will not be able to assist you with questions regarding bills from these companies.  You will be contacted with the lab results as soon as they are available. The fastest way to get your results is to activate your My Chart account. Instructions are located on the last page of this paperwork. If you have not heard from [place] regarding the results in 2 weeks, please contact this office.

## 2018-12-29 NOTE — Telephone Encounter (Signed)
I called pt using [employer], [place] [id number], no answer, left a message asking her to call me back.

## 2018-12-29 NOTE — Progress Notes (Signed)
7/23/20209:05 AM  [name] [name] 1966/04/09, 53 y.o., female [id number]  Chief Complaint  Patient presents with  . chronic conditions    6 m f/u   . Medication Refill    needs all meds refilled     HPI:   Patient is a 53 y.o. female with past medical history significant for migraines, HTN, HLP, mild OSA who presents today for routine followup  Last OV Jan 2020  Seen in ER June 2020 for precordial chest pain which improved with NTG, ekg unchanged, trop neg x 2, CXR, left AMA She reports that she was having acute SOB, chest pain, sweaty hands Patient did not want to go but her daughter convinced She has not had any chest pain nor SOB since then Had normal stress echo in April 2019 Had normal nuclear stress echo in Sept 2017 Very strong FHx paternal side of early MI Reports controlled GERD on medication Very active at home witihout problems But she cant go up a flight of stairs wo SOB and chest pain Reports constant fatigue Had sleep study in Dec 2019 - mild OSA, advised autopap, patient not on it  Struggling with depression, very tearful Has not had issues with depression before, denies any stressors   Takes amyptriptyline for migraines - well controlled  Otherwise takes BP and cholesterol meds daily as prescribed, denies se  Lab Results  Component Value Date   CREATININE 0.69 11/17/2018   BUN 14 11/17/2018   NA 139 11/17/2018   K 3.9 11/17/2018   CL 105 11/17/2018   CO2 24 11/17/2018   Lab Results  Component Value Date   CHOL 169 04/27/2018   HDL 41 04/27/2018   LDLCALC 93 04/27/2018   TRIG 175 (H) 04/27/2018   CHOLHDL 4.1 04/27/2018   BP Readings from Last 3 Encounters:  12/29/18 131/74  11/17/18 112/70  06/24/18 134/86    Depression screen PHQ 2/9 12/29/2018 06/24/2018 05/19/2018  Decreased Interest 0 0 0  Down, Depressed, Hopeless 0 0 0  PHQ - 2 Score 0 0 0    Fall Risk  12/29/2018 06/24/2018 05/19/2018 04/27/2018 09/16/2017  Falls in the past  year? 0 0 0 0 No  Number falls in past yr: 0 - - - -  Injury with Fall? 0 - - - -  Follow up Falls evaluation completed - - - -     No Known Allergies  Prior to Admission medications   Medication Sig Start Date End Date Taking? Authorizing Provider  amitriptyline (ELAVIL) 25 MG tablet Take 1 tablet (25 mg total) by mouth at bedtime. 04/27/18  Yes Rutherford Guys, MD  atorvastatin (LIPITOR) 20 MG tablet Take 1 tablet (20 mg total) by mouth daily. 04/27/18  Yes Rutherford Guys, MD  lisinopril-hydrochlorothiazide (PRINZIDE,ZESTORETIC) 20-25 MG tablet Take 1 tablet by mouth daily. 04/27/18  Yes Rutherford Guys, MD    Past Medical History:  Diagnosis Date  . Acid reflux   . Chest pain    09/15/2017 - normal stress echo  . Endometriosis   . Gastritis   . Hypertension   . Migraine   . Varicose veins     Past Surgical History:  Procedure Laterality Date  . CESAREAN SECTION     She had 5 C-Sections    Social History   Tobacco Use  . Smoking status: Never Smoker  . Smokeless tobacco: Never Used  Substance Use Topics  . Alcohol use: No    Alcohol/week: 0.0 standard drinks  Family History  Problem Relation Age of Onset  . Stroke Father   . Hypertension Father   . Diabetes Father   . Hypertension Mother   . Thyroid disease Mother     Review of Systems  Constitutional: Negative for chills and fever.  Respiratory: Negative for cough and shortness of breath.   Cardiovascular: Negative for chest pain, palpitations and leg swelling.  Gastrointestinal: Negative for abdominal pain, nausea and vomiting.  per hpi   OBJECTIVE:  Today's Vitals   12/29/18 0821  BP: 131/74  Pulse: 80  Temp: 98.8 F (37.1 C)  TempSrc: Oral  SpO2: 98%  Weight: 155 lb 12.8 oz (70.7 kg)  Height: 5\' 2"  (1.575 m)   Body mass index is 28.5 kg/m.   Physical Exam Vitals signs and nursing note reviewed.  Constitutional:      Appearance: She is well-developed.  HENT:     Head:  Normocephalic and atraumatic.  Eyes:     General: No scleral icterus.    Conjunctiva/sclera: Conjunctivae normal.     Pupils: Pupils are equal, round, and reactive to light.  Neck:     Musculoskeletal: Neck supple.  Pulmonary:     Effort: Pulmonary effort is normal.  Skin:    General: Skin is warm and dry.  Neurological:     Mental Status: She is alert and oriented to person, place, and time.    ASSESSMENT and PLAN  1. OSA (obstructive sleep apnea) Discussed retuning to sleep medicine to discuss starting treatment. - Ambulatory referral to Sleep Studies  2. Precordial chest pain 3. DOE (dyspnea on exertion) 4. FHx: early MI Recent past reassuring stress test, however given continued DOE and CP responsive to NTG, fhx referring to cards.  - Ambulatory referral to Cardiology  5. Depression, unspecified depression type Starting sertraline, reviewed r/se/b. Referred [facility], uninsured - TSH  6. Essential hypertension, benign Controlled. Continue current regime.   7. Hyperlipidemia, unspecified hyperlipidemia type Checking labs today, medications will be adjusted as needed.  - Lipid panel - Comprehensive metabolic panel  Other orders - lisinopril-hydrochlorothiazide (ZESTORETIC) 20-25 MG tablet; Take 1 tablet by mouth daily. - atorvastatin (LIPITOR) 20 MG tablet; Take 1 tablet (20 mg total) by mouth daily. - amitriptyline (ELAVIL) 25 MG tablet; Take 1 tablet (25 mg total) by mouth at bedtime. - sertraline (ZOLOFT) 25 MG tablet; Take 1 tablet (25 mg total) by mouth daily.  Return in about 4 weeks (around 01/26/2019) for depression.    Rutherford Guys, MD [facility] [place], [place] [address] Ph.  [phone] Fax [phone]

## 2018-12-29 NOTE — Telephone Encounter (Signed)
I have received a new referral for the pt stating she in now ready to get set up on auto cpap. The pt had a hst on 05/16/18. Can a new order be sent to a DME company for the pt to get started on a machine?

## 2018-12-29 NOTE — Addendum Note (Signed)
Addended by: Kittie Plater, Tiwanna Tuch HUA on: 12/29/2018 09:59 AM   Modules accepted: Orders

## 2018-12-30 LAB — COMPREHENSIVE METABOLIC PANEL
ALT: 27 IU/L (ref 0–32)
AST: 26 IU/L (ref 0–40)
Albumin/Globulin Ratio: 1.7 (ref 1.2–2.2)
Albumin: 4.7 g/dL (ref 3.8–4.9)
Alkaline Phosphatase: 90 IU/L (ref 39–117)
BUN/Creatinine Ratio: 19 (ref 9–23)
BUN: 12 mg/dL (ref 6–24)
Bilirubin Total: 0.6 mg/dL (ref 0.0–1.2)
CO2: 22 mmol/L (ref 20–29)
Calcium: 9.6 mg/dL (ref 8.7–10.2)
Chloride: 102 mmol/L (ref 96–106)
Creatinine, Ser: 0.64 mg/dL (ref 0.57–1.00)
GFR calc Af Amer: 119 mL/min/{1.73_m2} (ref >59)
GFR calc non Af Amer: 104 mL/min/{1.73_m2} (ref >59)
Globulin, Total: 2.8 g/dL (ref 1.5–4.5)
Glucose: 77 mg/dL (ref 65–99)
Potassium: 4.6 mmol/L (ref 3.5–5.2)
Sodium: 140 mmol/L (ref 134–144)
Total Protein: 7.5 g/dL (ref 6.0–8.5)

## 2018-12-30 LAB — LIPID PANEL
Chol/HDL Ratio: 3.9 ratio (ref 0.0–4.4)
Cholesterol, Total: 125 mg/dL (ref 100–199)
HDL: 32 mg/dL — ABNORMAL LOW (ref >39)
LDL Calculated: 43 mg/dL (ref 0–99)
Triglycerides: 250 mg/dL — ABNORMAL HIGH (ref 0–149)
VLDL Cholesterol Cal: 50 mg/dL — ABNORMAL HIGH (ref 5–40)

## 2018-12-30 LAB — TSH: TSH: 3.55 u[IU]/mL (ref 0.450–4.500)

## 2019-01-03 NOTE — Telephone Encounter (Signed)
I called pt, used [employer], [place], [place]: [id number], had a very extended phone call with the pt.  I reviewed PAP compliance expectations with the pt. Pt is agreeable to starting an auto-PAP. I advised pt that an order will be sent to a DME, [facility], and [facility] will call the pt within about one week after they file with the pt's insurance. [facility] will show the pt how to use the machine, fit for masks, and troubleshoot the auto-PAP if needed. A follow up appt was made for insurance purposes with Amy, NP on 03/29/19 at 8:00am. Pt verbalized understanding to arrive 15 minutes early and bring their auto-PAP. A letter with all of this information in it will be mailed to the pt as a reminder. I verified with the pt that the address we have on file is correct. Pt verbalized understanding of results. Pt had no questions at this time but was encouraged to call back if questions arise. I have sent the order to [facility] and have received confirmation that they have received the order.

## 2019-01-19 ENCOUNTER — Encounter: Payer: Self-pay | Admitting: Radiology

## 2019-01-24 ENCOUNTER — Telehealth: Payer: Self-pay | Admitting: Family Medicine

## 2019-01-24 NOTE — Telephone Encounter (Signed)
Pt called asking for lab results

## 2019-01-26 NOTE — Telephone Encounter (Signed)
Letter was mailed on Aug 13th. Overall her labs are fine. Thanks

## 2019-01-26 NOTE — Telephone Encounter (Signed)
See note. Can you call pt and translate lab results? Thanks.

## 2019-01-27 ENCOUNTER — Ambulatory Visit: Payer: Self-pay | Admitting: Family Medicine

## 2019-02-20 NOTE — Telephone Encounter (Signed)
Received this notice from [facility]: "Hey there!  Wanted to let you know that this patient does not have insurance and could not afford her CPAP so she advised that she would be looking online for a unit. If she purchases one, she will still get her mask from [place] so we can ensure proper fitting.   Let us know if we can do anything else.  Thank you!"

## 2019-03-29 ENCOUNTER — Ambulatory Visit: Payer: Self-pay | Admitting: Family Medicine

## 2019-04-19 ENCOUNTER — Ambulatory Visit: Payer: Self-pay | Admitting: Cardiovascular Disease

## 2019-04-27 ENCOUNTER — Ambulatory Visit (HOSPITAL_COMMUNITY): Payer: Self-pay

## 2019-05-05 ENCOUNTER — Ambulatory Visit: Payer: Self-pay | Admitting: Family Medicine

## 2019-05-24 NOTE — Progress Notes (Signed)
CARDIOLOGY OFFICE NOTE  Date:  05/30/2019    [name] [name] Date of Birth: [birth year] Medical Record [id number]  PCP:  Rutherford Guys, MD  Cardiologist:  Johnsie Cancel  Chief Complaint  Patient presents with  . Chest Pain    Seen for Dr. Johnsie Cancel    History of Present Illness: [name] [name] is a 53 y.o. female who presents today for a work in visit. Seen for Dr. Johnsie Cancel.   She was seen here over a year ago with chest pain. Has known HTN and had ran out of medicines. Other issues include HLD and anxiety. Noted to insurance and reluctance to testing. Negative stress echo at that time.   The patient does not have symptoms concerning for COVID-19 infection (fever, chills, cough, or new shortness of breath).   Comes in today. Here with an interpreter today. Says she is just here for "check up". Feels ok. Then tells me that sometimes feels like she is out of breath and gets easily tired. No chest pain. She tries to stay active. She will use her treadmill occasionally - nothing regular - she will get out of breath with this. She does not know why and wants to know why. Her weight is basically unchanged. BP is ok. She is on lipid lowering agents. Labs from past few months noted.  Some occasional swelling. Says she does not get much salt. She is asking about her stress test results from April of 2019. FH + for CAD on her father's side. She is concerned that something may happen to her since stuff has happened to them. She has no insurance - has to pay out of pocket.   Past Medical History:  Diagnosis Date  . Acid reflux   . Chest pain    09/15/2017 - normal stress echo  . Endometriosis   . Gastritis   . Hypertension   . Migraine   . Varicose veins     Past Surgical History:  Procedure Laterality Date  . CESAREAN SECTION     She had 5 C-Sections     Medications: Current Meds  Medication Sig  . amitriptyline (ELAVIL) 25 MG tablet Take 1 tablet (25 mg total) by mouth at bedtime.   Marland Kitchen atorvastatin (LIPITOR) 20 MG tablet TAKE ONE TABLET BY MOUTH AT [place]  . lisinopril-hydrochlorothiazide (ZESTORETIC) 20-25 MG tablet Take 1 tablet by mouth daily.     Allergies: No Known Allergies  Social History: The patient  reports that she has never smoked. She has never used smokeless tobacco. She reports that she does not drink alcohol or use drugs.   Family History: The patient's family history includes Diabetes in her father; Hypertension in her father and mother; Stroke in her father; Thyroid disease in her mother.   Review of Systems: Please see the history of present illness.   All other systems are reviewed and negative.   Physical Exam: VS:  BP 122/72   Pulse 93   Ht 5\' 2"  (1.575 m)   Wt 153 lb 1.9 oz (69.5 kg)   LMP 05/12/2012   SpO2 98%   BMI 28.01 kg/m  .  BMI Body mass index is 28.01 kg/m.  Wt Readings from Last 3 Encounters:  05/30/19 153 lb 1.9 oz (69.5 kg)  12/29/18 155 lb 12.8 oz (70.7 kg)  11/17/18 150 lb (68 kg)    General: Pleasant. Alert and in no acute distress. She is overweight.   HEENT: Normal.  Neck: Supple, no  JVD, carotid bruits, or masses noted.  Cardiac: Regular rate and rhythm. No murmurs, rubs, or gallops. No edema.  Respiratory:  Lungs are clear to auscultation bilaterally with normal work of breathing.  GI: Soft and nontender.  MS: No deformity or atrophy. Gait and ROM intact.  Skin: Warm and dry. Color is normal.  Neuro:  Strength and sensation are intact and no gross focal deficits noted.  Psych: Alert, appropriate and with normal affect.   LABORATORY DATA:  EKG:  EKG is ordered today. This demonstrates NSR. She has chronic inferior Q's in lead 3.   Lab Results  Component Value Date   WBC 5.0 11/17/2018   HGB 14.1 11/17/2018   HCT 42.7 11/17/2018   PLT 251 11/17/2018   GLUCOSE 77 12/29/2018   CHOL 125 12/29/2018   TRIG 250 (H) 12/29/2018   HDL 32 (L) 12/29/2018   LDLCALC 43 12/29/2018   ALT 27 12/29/2018   AST  26 12/29/2018   NA 140 12/29/2018   K 4.6 12/29/2018   CL 102 12/29/2018   CREATININE 0.64 12/29/2018   BUN 12 12/29/2018   CO2 22 12/29/2018   TSH 3.550 12/29/2018   HGBA1C 5.8 (H) 07/01/2016     BNP (last 3 results) No results for input(s): BNP in the last 8760 hours.  ProBNP (last 3 results) No results for input(s): PROBNP in the last 8760 hours.   Other Studies Reviewed Today:  Stress Echo Study Conclusions 09/2017  - Stress ECG conclusions: There were no stress arrhythmias or   conduction abnormalities. The stress ECG was negative for   ischemia. - Staged echo: There was no echocardiographic evidence for   stress-induced ischemia. Good exercise capacity. Normal blood   pressure response to stress.  Assessment/Plan:  1. Chest pain - this seems resolved.   2. HTN - BP is fine on her current regimen.    3. HLD - on statin therapy - we talked about calcium scoring - she is interested in knowing this information.   4. DOE/fatigue - probably multifactorial - she is agreeable to starting with GXT since this will be an out of pocket expense. CV risk factor modification no matter the result is imperative.    5. Chronically abnormal EKG with insignificant Q waves inferiorly - this is unchanged.   6. COVID-19 Education: The signs and symptoms of COVID-19 were discussed with the patient and how to seek care for testing (follow up with PCP or arrange E-visit).  The importance of social distancing, staying at home, hand hygiene and wearing a mask when out in public were discussed today.  Current medicines are reviewed with the patient today.  The patient does not have concerns regarding medicines other than what has been noted above.  The following changes have been made:  See above.  Labs/ tests ordered today include:    Orders Placed This Encounter  Procedures  . CT CARDIAC SCORING  . Exercise Tolerance Test  . EKG 12-Lead     Disposition:   Further disposition  pending.   Patient is agreeable to this plan and will call if any problems develop in the interim.   SignedTruitt Merle, NP  05/30/2019 2:44 PM  [facility] [address] [place], [place]  [address] Phone: [phone] Fax: [phone]

## 2019-05-30 ENCOUNTER — Ambulatory Visit (INDEPENDENT_AMBULATORY_CARE_PROVIDER_SITE_OTHER): Payer: Self-pay | Admitting: Nurse Practitioner

## 2019-05-30 ENCOUNTER — Encounter: Payer: Self-pay | Admitting: Nurse Practitioner

## 2019-05-30 ENCOUNTER — Other Ambulatory Visit: Payer: Self-pay

## 2019-05-30 ENCOUNTER — Other Ambulatory Visit: Payer: Self-pay | Admitting: Family Medicine

## 2019-05-30 VITALS — BP 122/72 | HR 93 | Ht 62.0 in | Wt 153.1 lb

## 2019-05-30 DIAGNOSIS — R0600 Dyspnea, unspecified: Secondary | ICD-10-CM

## 2019-05-30 DIAGNOSIS — R5383 Other fatigue: Secondary | ICD-10-CM

## 2019-05-30 DIAGNOSIS — E7800 Pure hypercholesterolemia, unspecified: Secondary | ICD-10-CM

## 2019-05-30 DIAGNOSIS — R0609 Other forms of dyspnea: Secondary | ICD-10-CM

## 2019-05-30 DIAGNOSIS — R079 Chest pain, unspecified: Secondary | ICD-10-CM

## 2019-05-30 NOTE — Patient Instructions (Addendum)
After Visit Summary:  We will be checking the following labs today - NONE   Medication Instructions:    Continue with your current medicines.    If you need a refill on your cardiac medications before your next appointment, please call your pharmacy.     Testing/Procedures To Be Arranged:  GXT - will need COVID testing prior  Cardiac CT calcium scoring - this is an out of pocket expense  Follow-Up:   Let's see how your testing turns out.     At [facility], you and your health needs are our priority.  As part of our continuing mission to provide you with exceptional heart care, we have created designated Provider Care Teams.  These Care Teams include your primary Cardiologist (physician) and Advanced Practice Providers (APPs -  Physician Assistants and Nurse Practitioners) who all work together to provide you with the care you need, when you need it.  Special Instructions:  . Stay safe, stay home, wash your hands for at least 20 seconds and wear a mask when out in public.  . It was good to talk with you today.    Call the [place] office at [phone] if you have any questions, problems or concerns.

## 2019-06-08 ENCOUNTER — Inpatient Hospital Stay (HOSPITAL_COMMUNITY): Admission: RE | Admit: 2019-06-08 | Payer: Self-pay | Source: Ambulatory Visit

## 2019-06-10 ENCOUNTER — Other Ambulatory Visit (HOSPITAL_COMMUNITY): Admission: RE | Admit: 2019-06-10 | Payer: Self-pay | Source: Ambulatory Visit

## 2019-06-12 ENCOUNTER — Other Ambulatory Visit (HOSPITAL_COMMUNITY)
Admission: RE | Admit: 2019-06-12 | Discharge: 2019-06-12 | Disposition: A | Discharge status: Home / Self Care | Payer: HRSA Program | Source: Ambulatory Visit | Attending: Nurse Practitioner | Admitting: Nurse Practitioner

## 2019-06-12 DIAGNOSIS — Z01812 Encounter for preprocedural laboratory examination: Secondary | ICD-10-CM | POA: Diagnosis present

## 2019-06-12 DIAGNOSIS — Z20822 Contact with and (suspected) exposure to covid-19: Secondary | ICD-10-CM | POA: Insufficient documentation

## 2019-06-12 LAB — SARS CORONAVIRUS 2 (TAT 6-24 HRS): SARS Coronavirus 2: NEGATIVE

## 2019-06-13 ENCOUNTER — Ambulatory Visit
Admission: RE | Admit: 2019-06-13 | Discharge: 2019-06-13 | Disposition: A | Discharge status: Home / Self Care | Payer: Self-pay | Source: Ambulatory Visit | Attending: Nurse Practitioner | Admitting: Nurse Practitioner

## 2019-06-13 ENCOUNTER — Ambulatory Visit (INDEPENDENT_AMBULATORY_CARE_PROVIDER_SITE_OTHER): Payer: Self-pay

## 2019-06-13 ENCOUNTER — Other Ambulatory Visit: Payer: Self-pay

## 2019-06-13 DIAGNOSIS — E7800 Pure hypercholesterolemia, unspecified: Secondary | ICD-10-CM

## 2019-06-13 DIAGNOSIS — R0600 Dyspnea, unspecified: Secondary | ICD-10-CM

## 2019-06-13 DIAGNOSIS — R5383 Other fatigue: Secondary | ICD-10-CM

## 2019-06-13 DIAGNOSIS — R0609 Other forms of dyspnea: Secondary | ICD-10-CM

## 2019-06-13 LAB — EXERCISE TOLERANCE TEST
Estimated workload: 7 METS
Exercise duration (min): 6 min
Exercise duration (sec): 0 s
MPHR: 168 {beats}/min
Peak HR: 144 {beats}/min
Percent HR: 85 %
RPE: 16
Rest HR: 93 {beats}/min

## 2019-06-19 ENCOUNTER — Other Ambulatory Visit: Payer: Self-pay | Admitting: *Deleted

## 2019-06-19 MED ORDER — ASPIRIN EC 81 MG PO TBEC: 81.0000 mg | DELAYED_RELEASE_TABLET | Freq: Every day | ORAL | 3 refills | Status: DC

## 2019-06-27 NOTE — Progress Notes (Signed)
CARDIOLOGY OFFICE NOTE  Date:  06/28/2019    [name] [name] Date of Birth: 10-20-1965 Medical Record [id number]  PCP:  Rutherford Guys, MD  Cardiologist:  Gillian Shields   Chief Complaint  Patient presents with  . Follow-up    Seen for Dr. Johnsie Cancel    History of Present Illness: [name] [name] is a 54 y.o. female who presents today for a follow up visit. Seen for Dr. Johnsie Cancel.   She was seen here over a year ago with chest pain. Has known HTN and had ran out of medicines. Other issues include HLD and anxiety. Noted to not have insurance and reluctance to testing. Negative stress echo at that time.   I saw her last month - she was here for a "check up". No chest pain. Concerned about her FH. Calcium score and GXT were arranged - she had poor exercise tolerance - otherwise GXT was fine. Calcium score is elevated - she needs aggressive CV risk factor modification. Does not have insurance.   She ended up in the ER in [place] after her testing here - had headache - some chest pain - was told to come back here. Troponins negative. BP was elevated.   The patient does not have symptoms concerning for COVID-19 infection (fever, chills, cough, or new shortness of breath).   Comes in today. Here with the interpreter today. She notes that she had a headache this past Saturday - it lasted all day - Sunday she woke up and was still with head ache and sharp pain in her chest and felt short of breath. She took aspirin. Husband took her to the ER - no medicine changes. BP there was 150/94. Troponins negative. Other labs ok - has not had lipids. She says her head still hurts. Does not endorse chest pain. She remains anxious.   Past Medical History:  Diagnosis Date  . Acid reflux   . Chest pain    09/15/2017 - normal stress echo  . Endometriosis   . Gastritis   . Hypertension   . Migraine   . Varicose veins     Past Surgical History:  Procedure Laterality Date  . CESAREAN  SECTION     She had 5 C-Sections     Medications: Current Meds  Medication Sig  . amitriptyline (ELAVIL) 25 MG tablet Take 1 tablet (25 mg total) by mouth at bedtime.  Marland Kitchen aspirin EC 81 MG tablet Take 1 tablet (81 mg total) by mouth daily.  Marland Kitchen atorvastatin (LIPITOR) 20 MG tablet TAKE ONE TABLET BY MOUTH AT [place]  . lisinopril-hydrochlorothiazide (ZESTORETIC) 20-25 MG tablet Take 1 tablet by mouth daily.     Allergies: No Known Allergies  Social History: The patient  reports that she has never smoked. She has never used smokeless tobacco. She reports that she does not drink alcohol or use drugs.   Family History: The patient's family history includes Diabetes in her father; Hypertension in her father and mother; Stroke in her father; Thyroid disease in her mother.   Review of Systems: Please see the history of present illness.   All other systems are reviewed and negative.   Physical Exam: VS:  BP 134/90   Pulse 96   Ht 5\' 2"  (1.575 m)   Wt 155 lb 1.9 oz (70.4 kg)   LMP 05/12/2012   SpO2 98%   BMI 28.37 kg/m  .  BMI Body mass index is 28.37 kg/m.  Wt Readings from  Last 3 Encounters:  06/28/19 155 lb 1.9 oz (70.4 kg)  05/30/19 153 lb 1.9 oz (69.5 kg)  12/29/18 155 lb 12.8 oz (70.7 kg)    General: Alert and in no acute distress.   HEENT: Normal.  Neck: Supple, no JVD, carotid bruits, or masses noted.  Cardiac: Regular rate and rhythm but rate is faster today. No murmurs, rubs, or gallops. No edema.  Respiratory:  Lungs are clear to auscultation bilaterally with normal work of breathing.  GI: Soft and nontender.  MS: No deformity or atrophy. Gait and ROM intact.  Skin: Warm and dry. Color is normal.  Neuro:  Strength and sensation are intact and no gross focal deficits noted.  Psych: Alert, appropriate and with normal affect.   LABORATORY DATA:  EKG:  EKG is not ordered today.  Lab Results  Component Value Date   WBC 5.0 11/17/2018   HGB 14.1 11/17/2018    HCT 42.7 11/17/2018   PLT 251 11/17/2018   GLUCOSE 77 12/29/2018   CHOL 125 12/29/2018   TRIG 250 (H) 12/29/2018   HDL 32 (L) 12/29/2018   LDLCALC 43 12/29/2018   ALT 27 12/29/2018   AST 26 12/29/2018   NA 140 12/29/2018   K 4.6 12/29/2018   CL 102 12/29/2018   CREATININE 0.64 12/29/2018   BUN 12 12/29/2018   CO2 22 12/29/2018   TSH 3.550 12/29/2018   HGBA1C 5.8 (H) 07/01/2016     BNP (last 3 results) No results for input(s): BNP in the last 8760 hours.  ProBNP (last 3 results) No results for input(s): PROBNP in the last 8760 hours.   Other Studies Reviewed Today:  GXT Study Highlights 06/2019    Blood pressure demonstrated a normal response to exercise.  No T wave inversion was noted during stress.  There was no ST segment deviation noted during stress.  Overall, the patient's exercise capacity was mildly impaired.  [facility] Treadmill Score: low risk   Negative stress test without evidence of ischemia at given workload.   CALCIUM SCORE IMPRESSION 06/2019: Coronary calcium score of 1.82. This was 86th percentile for age and sex matched control.   Electronically Signed   By: Pixie Casino M.D.   On: 06/13/2019 14:21  Stress Echo Study Conclusions 09/2017  - Stress ECG conclusions: There were no stress arrhythmias or conduction abnormalities. The stress ECG was negative for ischemia. - Staged echo: There was no echocardiographic evidence for stress-induced ischemia. Good exercise capacity. Normal blood pressure response to stress.  Assessment/Plan:  1. Recent ER visit for headache/chest pain - may be BP related - has just had GXT testing - will add Lopressor 25 mg BID. I do not think she needs further testing at this time - but may need to entertain if she fails to improve.   2. Elevated calcium score - needs aggressive CV risk factor modification - on aspirin and statin - adding beta blocker today.   3. Recent negative GXT - did have poor  exercise tolerance noted - but otherwise negative - reviewed again with her.   4. HTN - see #1  5. HLD - needs lipids on return  6. Chronically abnormal EKG with insignificant Q waves inferiorly - recent negative GXT other than poor exercise tolerance.   7. COVID-19 Education: The signs and symptoms of COVID-19 were discussed with the patient and how to seek care for testing (follow up with PCP or arrange E-visit).  The importance of social distancing, staying at home, hand  hygiene and wearing a mask when out in public were discussed today.  Current medicines are reviewed with the patient today.  The patient does not have concerns regarding medicines other than what has been noted above.  The following changes have been made:  See above.  Labs/ tests ordered today include:   No orders of the defined types were placed in this encounter.    Disposition:   FU with [place] in about a few weeks.   Patient is agreeable to this plan and will call if any problems develop in the interim.   SignedTruitt Merle, NP  06/28/2019 2:55 PM  [facility] [address] [place], [place]  [address] Phone: [phone] Fax: [phone]

## 2019-06-28 ENCOUNTER — Ambulatory Visit (INDEPENDENT_AMBULATORY_CARE_PROVIDER_SITE_OTHER): Payer: Self-pay | Admitting: Nurse Practitioner

## 2019-06-28 ENCOUNTER — Encounter: Payer: Self-pay | Admitting: Nurse Practitioner

## 2019-06-28 ENCOUNTER — Other Ambulatory Visit: Payer: Self-pay

## 2019-06-28 VITALS — BP 134/90 | HR 96 | Ht 62.0 in | Wt 155.1 lb

## 2019-06-28 DIAGNOSIS — Z7189 Other specified counseling: Secondary | ICD-10-CM

## 2019-06-28 DIAGNOSIS — I10 Essential (primary) hypertension: Secondary | ICD-10-CM

## 2019-06-28 DIAGNOSIS — R079 Chest pain, unspecified: Secondary | ICD-10-CM

## 2019-06-28 DIAGNOSIS — E7800 Pure hypercholesterolemia, unspecified: Secondary | ICD-10-CM

## 2019-06-28 MED ORDER — GENERIC EXTERNAL MEDICATION
Status: DC
Start: ? — End: 2019-06-28

## 2019-06-28 MED ORDER — METOPROLOL TARTRATE 25 MG PO TABS: 25.0000 mg | ORAL_TABLET | Freq: Two times a day (BID) | ORAL | 3 refills | Status: DC

## 2019-06-28 MED ORDER — SODIUM CHLORIDE 0.9 % IV SOLN
10.00 | INTRAVENOUS | Status: DC
Start: ? — End: 2019-06-28

## 2019-06-28 NOTE — Patient Instructions (Addendum)
After Visit Summary:  We will be checking the following labs today - NONE   Medication Instructions:    Continue with your current medicines.   I am adding Lopressor 25 mg to take twice a day - this is at [employer].    If you need a refill on your cardiac medications before your next appointment, please call your pharmacy.     Testing/Procedures To Be Arranged:  N/A  Follow-Up:   See Dr. Johnsie Cancel in about a month with fasting labs - skip lunch that day    At [facility], you and your health needs are our priority.  As part of our continuing mission to provide you with exceptional heart care, we have created designated Provider Care Teams.  These Care Teams include your primary Cardiologist (physician) and Advanced Practice Providers (APPs -  Physician Assistants and Nurse Practitioners) who all work together to provide you with the care you need, when you need it.  Special Instructions:  . Stay safe, stay home, wash your hands for at least 20 seconds and wear a mask when out in public.  . It was good to talk with you today.  . Monitor your BP a few times a week for [place]   Call the [place] office at [phone] if you have any questions, problems or concerns.

## 2019-07-04 ENCOUNTER — Encounter (HOSPITAL_COMMUNITY): Payer: Self-pay

## 2019-07-04 ENCOUNTER — Other Ambulatory Visit: Payer: Self-pay

## 2019-07-04 ENCOUNTER — Ambulatory Visit (HOSPITAL_COMMUNITY)
Admission: RE | Admit: 2019-07-04 | Discharge: 2019-07-04 | Disposition: A | Discharge status: Home / Self Care | Payer: Self-pay | Source: Ambulatory Visit | Attending: Obstetrics and Gynecology | Admitting: Obstetrics and Gynecology

## 2019-07-04 DIAGNOSIS — Z1239 Encounter for other screening for malignant neoplasm of breast: Secondary | ICD-10-CM | POA: Insufficient documentation

## 2019-07-04 NOTE — Patient Instructions (Signed)
Explained breast self awareness with Kayelee P Mckillop. Let patient know that if today's Pap smear is normal that her next Pap smear will be due in one year due to her history of an abnormal Pap smear. Referred patient to [facility] for a screening mammogram. Appointment scheduled for Thursday, July 06, 2019 at 1130. Patient aware of appointment and will be there. Let patient know will follow up with her within the next couple weeks with results of her Pap smear by letter or phone. Informed patient that [facility] will follow-up with her within a couple of weeks with the results of her mammogram by letter or phone. Makyah P Rancourt verbalized understanding.  Antwine Agosto, Arvil Ch[place], RN 12:23 PM

## 2019-07-04 NOTE — Progress Notes (Signed)
Complaints of left outer breast pain at times that has been going on for years. Patient rated the pain at a 5 out of 10.  Pap Smear: Pap smear completed today. Last Pap smear was 04/19/2018 at [facility] and normal with positive HPV. Patients previous Pap smear was 09/14/2016 at the free cervical cancer screening sponsored by [facility] and normal. Patient has a history of an abnormal Pap smear 09/09/2015 in [facility] that was LGSIL and positive HPV. 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 Last three Pap smears and colposcopy results are in Epic.     Physical exam: Breasts Breasts symmetrical. No skin abnormalities bilateral breasts. No nipple retraction bilateral breasts. No nipple discharge bilateral breasts. No lymphadenopathy. No lumps palpated bilateral breasts. Complaints of left diffuse breast tenderness on exam. Referred patient to [facility] for a screening mammogram. Appointment scheduled for Thursday, July 06, 2019 at 1130.        Pelvic/Bimanual   Ext Genitalia No lesions, no swelling and no discharge observed on external genitalia.         Vagina Vagina pink and normal texture. No lesions or discharge observed in vagina.          Cervix Cervix is present. Cervix pink and of normal texture. No discharge observed.     Uterus Uterus is present and palpable. Uterus in normal position and normal size.        Adnexae Bilateral ovaries present and palpable. No tenderness on palpation.         Rectovaginal No rectal exam completed today since patient had no rectal complaints. No skin abnormalities observed on exam.    Smoking History: Patient has never smoked.  Patient Navigation: Patient education provided. Access to services provided for patient through [facility] program. Spanish interpreter provided.   Colorectal Cancer Screening: Per patient has never had a colonoscopy completed. No complaints today.   Breast and  Cervical Cancer Risk Assessment: Patient has no family history of breast cancer, known genetic mutations, or radiation treatment to the chest before age 42. Patient has a history of cervical dysplasia. Patient has not history of being immunocompromised or DES exposure in-utero.  Risk Assessment    Risk Scores      07/04/2019 04/19/2018   Last edited by: Loletta Parish, RN Armond Hang, LPN   5-year risk: 0.7 % 0.7 %   Lifetime risk: 6 % 6.1 %         Used Spanish interpreter Rudene Anda from [place].

## 2019-07-06 LAB — HM MAMMOGRAPHY

## 2019-07-07 LAB — CYTOLOGY - PAP
Comment: NEGATIVE
High risk HPV: POSITIVE — AB

## 2019-07-12 ENCOUNTER — Telehealth (HOSPITAL_COMMUNITY): Payer: Self-pay | Admitting: *Deleted

## 2019-07-12 NOTE — Telephone Encounter (Signed)
Called patient with Spanish interpreter [name] [name] to discuss Pap smear results. Let patient know that her Pap smear was abnormal and HPV positive. Informed patient that a colposcopy is recommended for follow-up. Gave patient appointment at the [facility] [place] for her colposcopy on 08/03/2019 at [place]. Gave patient appointment details and let her know [facility] will cover. Answered patients questions. Patient verbalized understanding.

## 2019-07-19 ENCOUNTER — Ambulatory Visit: Payer: Self-pay | Admitting: Cardiovascular Disease

## 2019-08-03 ENCOUNTER — Other Ambulatory Visit: Payer: Self-pay

## 2019-08-03 ENCOUNTER — Other Ambulatory Visit (HOSPITAL_COMMUNITY)
Admission: RE | Admit: 2019-08-03 | Discharge: 2019-08-03 | Disposition: A | Discharge status: Home / Self Care | Payer: Self-pay | Source: Ambulatory Visit | Attending: Family Medicine | Admitting: Family Medicine

## 2019-08-03 ENCOUNTER — Encounter: Payer: Self-pay | Admitting: Family Medicine

## 2019-08-03 ENCOUNTER — Ambulatory Visit (INDEPENDENT_AMBULATORY_CARE_PROVIDER_SITE_OTHER): Payer: Self-pay | Admitting: Family Medicine

## 2019-08-03 VITALS — BP 114/83 | HR 74 | Wt 151.1 lb

## 2019-08-03 DIAGNOSIS — N870 Mild cervical dysplasia: Secondary | ICD-10-CM | POA: Insufficient documentation

## 2019-08-03 LAB — POCT PREGNANCY, URINE: Preg Test, Ur: NEGATIVE

## 2019-08-03 NOTE — Progress Notes (Signed)
    GYNECOLOGY CLINIC COLPOSCOPY PROCEDURE NOTE  54 y.o. IN:[id number] here for colposcopy for low-grade squamous intraepithelial neoplasia (LGSIL - encompassing HPV,mild dysplasia,CIN I) pap smear on 07/04/2019. Discussed role for HPV in cervical dysplasia, need for surveillance.  Patient given informed consent, signed copy in the chart, time out was performed.  Placed in lithotomy position. Cervix viewed with speculum and colposcope after application of acetic acid.   Colposcopy adequate? Yes  acetowhite lesion(s) noted at 8-9 o'clock and 5 oclock, punctation noted at 8 o'clock and abnormal vessels noted at 8-9 o'clock; corresponding biopsies obtained.  ECC specimen obtained.  Physical Exam Genitourinary:    Cervix: No cervical motion tenderness, friability, lesion or erythema.         All specimens were labeled and sent to pathology.  Patient was given post procedure instructions.  Will follow up pathology and manage accordingly; patient will be contacted with results and recommendations.  Routine preventative health maintenance measures emphasized.  Reviewed mammogram, needs yearly mammogram Reviewed plan to call with results and send Mychart Counseled on the role of HPV in cervical cancer Reviewed HPV vaccination -- has two daughters and she would like to make sure they receive this.  Reviewed possible follow up to colpo being repeat pap/colpo or excisional procedure, in rare cases removal of uterus.   Consent was done with the interpreter and spoke spanish with the patient during the procedure.   Caren Macadam, MD, MPH, ABFM, [facility] Attending Physician [facility] for [place], [place]

## 2019-08-03 NOTE — Patient Instructions (Signed)
Colposcopa, cuidados posteriores Colposcopy, Care After Lea esta informacin sobre cmo cuidarse despus del procedimiento. El mdico tambin podr darle indicaciones ms especficas. Si tiene problemas o preguntas, llame al mdico. Qu puedo esperar despus del procedimiento? Si no le han extrado [name] [name] de tejido (no le realizaron una biopsia), es posible que solo presente unas manchas durante [place]. Puede reanudar sus actividades habituales. Si le extrajeron una muestra de tejido, es frecuente que tenga lo siguiente:  Sensibilidad y Social research officer, government. [place].  Sensacin de desvanecimiento.  Sangrado leve de la vagina o secrecin granulada de color oscuro de la vagina. [place]. Es posible que deba usar un apsito sanitario.  [place] durante al menos 48horas despus del procedimiento. Siga estas indicaciones en su casa:   Tome los medicamentos de venta libre y los recetados solamente como se lo haya indicado el mdico. Pregntele al mdico qu medicamentos puede comenzar a tomar nuevamente. Esto es muy importante si toma anticoagulantes.  No conduzca ni use maquinaria pesada mientras toma analgsicos recetados.  Durante 3das, o durante el tiempo que le indique el mdico, evite lo siguiente: ? [employer]. ? Los tampones. ? [place].  Si [place] un mtodo anticonceptivo, contine usndolo.  Limite la Counselling psychologist posterior al procedimiento. Pregntele al mdico qu actividades son seguras para usted.  Es su responsabilidad retirar [employer] del procedimiento. Pregntele al mdico la fecha en que los resultados estarn disponibles.  Concurra a todas las visitas de control como se lo haya indicado el mdico. Esto es importante. Comunquese con un mdico si:  Aparece una erupcin cutnea. Solicite ayuda de inmediato si:  Tiene mucho sangrado de la vagina. Mucho sangrado es si [place] ms de un  apsito sanitario por hora durante 2horas seguidas.  Elimina grumos de sangre (cogulos de [place]) por la vagina.  Tiene fiebre.  Tiene escalofros.  Tiene dolor en la parte baja del vientre (zona plvica).  Tiene signos de infeccin, como secrecin vaginal que: ? Es diferente de la habitual. ? Es de Journalist, newspaper. ? Tiene mal olor.  Tiene mucho dolor o clicos abdominales en la parte baja del vientre que no se alivian con medicamentos.  Tiene sensacin de desvanecimiento.  Siente mareos.  Pierde el conocimiento (se desmaya). Resumen  Si no le han extrado [name] [name] de tejido (no le realizaron una biopsia), es posible que solo presente unas manchas durante [name] [name]. Puede reanudar sus actividades habituales.  Si le extrajeron [name] [name] de tejido, es frecuente tener dolor leve y [place].  Durante 3das, o durante el tiempo que le indique el mdico, evite las duchas vaginales, los tampones y [employer].  Busque ayuda de inmediato si presenta sangrado, mucho dolor intenso o signos de infeccin. Esta informacin no tiene Marine scientist el consejo del mdico. Asegrese de hacerle al mdico cualquier pregunta que tenga. Document Revised: 01/01/2017 Document Reviewed: 12/22/2012  Elsevier Patient Education  [place] Human Papillomavirus O papilomavrus humano (HPV)  a doena sexualmente transmissvel (DST) mais comum. O vrus se espalha facilmente de pessoa para pessoa ( muito contagioso). Existem muitos tipos de HPV. Muitas vezes, o HPV no causa sintomas. Algumas vezes, pode causar verrugas genitais que podem ser vistas e sentidas. Tambm podem aparecer leses semelhantes a verrugas na garganta. Voc pode ter HPV h muito tempo e no saber disso. Voc pode transmitir o HPV para outras pessoas sem saber  disso. Certos tipos de HPV podem causar cncer. Quais so as causas? O HPV  causado por um vrus  que  transmitido de pessoa para pessoa por meio do sexo. O que aumenta o risco? Voc pode ter mais chance de pegar HPV se: Fez ou faz sexo desprotegido de qualquer tipo. Tem ou teve vrios parceiros sexuais. Tem ou teve um parceiro sexual que tem outros parceiros sexuais. Tem ou teve Laverle Patter IST (infeco sexualmente transmissvel). Est ou esteve com o sistema de defesa contra doenas (sistema imune) enfraquecido. Est ou esteve com a pele danificada na rea genital, oral ou anal. Quais so os sinais ou sintomas? A maioria das pessoas que tem HPV no apresentam sintomas. Caso voc apresente sintomas, eles podem incluir: Leses semelhantes a verruga na garganta, de fazer sexo oral. Verrugas nas reas infectadas. Verrugas genitais que [employer], arder, Therapist, art ou doer durante o sexo. Como esse quadro clnico  tratado? No h tratamento para o vrus. Entretanto, h tratamentos para os problemas de sade e sintomas que o HPV pode causar. Seu mdico pode tratar o HPV: Administrando medicamentos em forma de cremes, loes, lquidos ou gis. Esses medicamentos podem ser injetados ou aplicados nas verrugas genitais ou anais. [place] seguintes alternativas nas verrugas genitais ou anais: Frio extremo. Um feixe de luz intensa (tratamento a laser). Calor extremo. Fazendo uma cirurgia para remover as verrugas genitais ou anais. Seu mdico vai monitorar voc de perto aps o tratamento. O HPV pode voltar e precisar de tratamento novamente. Siga essas instrues em casa: Medicamentos Tome medicamentos vendidos com ou sem prescrio somente de acordo com as indicaes do seu mdico. No trate verrugas genitais com medicamentos destinados ao tratamento de verrugas da mo. Instrues gerais No coce nem toque nas verrugas. No faa sexo durante o tratamento. No faa ducha vaginal nem use absorventes internos durante o tratamento (no caso de mulheres). Harriet Pho a seu parceiro sexual sobre a  infeco. Ele ou ela tambm pode precisar fazer tratamento. Em caso de gravidez, informe seu mdico que voc tem HPV. Seu mdico vai monitor-la durante a gravidez. Comparea a todas as consultas de acompanhamento de acordo com as orientaes do seu mdico. Isso  importante. Como esse quadro clnico pode ser prevenido? Converse com seu mdico sobre tomar vacina contra HPV. Isso pode prevenir algumas infeces por HPV e alguns tipos de cncer relacionados. Voc pode precisar de 2-3 doses da vacina, dependendo de sua idade. A vacina no funcionar se voc j tiver HPV. A vacina no  recomendada para mulheres grvidas. Aps o tratamento, use preservativos nas relaes sexuais. Isso ajuda a prevenir futuras infeces. Tenha relaes somente com um parceiro ou parceira. Tenha um parceiro ou parceira que no tem outros parceiros. Faa os exames de [place] conforme as orientao de seu mdico. Entre em contato com um mdico se: A pele tratada ficar vermelha, inchada ou dolorida. Tiver febre. Sentir-se mal. Sentir caroos ou espinhas surgindo na rea genital ou anal, ou em torno delas. Perceber sangramento saindo da vagina. Ocorrer sangramento na rea que foi tratada. Sentir dor durante o sexo. Resumo O HPV  a IST (infeco sexualmente transmissvel) mais comum. O vrus  transmitido facilmente de pessoa para pessoa. Muitas vezes, o HPV no causa nenhum sintoma. A vacina contra HPV pode prevenir algumas infeces por HPV e tipos de cncer relacionados. No h tratamento para o vrus. Entretanto, h tratamentos para os problemas de sade e sintomas que o HPV pode causar. Estas informaes no se destinam a substituir as recomendaes  de seu mdico. No deixe de discutir quaisquer dvidas com seu mdico. Document Revised: 02/01/2018 Document Reviewed: 02/01/2018 Elsevier Patient Education  [place].

## 2019-08-04 LAB — SURGICAL PATHOLOGY

## 2019-08-09 ENCOUNTER — Telehealth: Payer: Self-pay | Admitting: *Deleted

## 2019-08-09 NOTE — Telephone Encounter (Addendum)
-----   Message from Caren Macadam, MD sent at 08/09/2019  2:37 PM EST ----- Colposcopy shows CIN 1. Recommend repeat pap in 1 year with cotesting (Jan 2022)  3/3  1620  Called pt w/interpreter [facility]. She was informed of Colpo results and recommendation for pap in 1 year. Pt had multiple questions about the Pap and Colpo results. Detailed explanation was provided. Pt voiced understanding of information and instructions given.

## 2019-09-27 ENCOUNTER — Encounter: Payer: Self-pay | Admitting: *Deleted

## 2019-10-03 IMAGING — DX DG KNEE COMPLETE 4+V*L*
4 series · 4 of 4 positions shown · non-contrast
Comparison: None.

CLINICAL DATA: Chronic left knee pain.

EXAM:
LEFT KNEE - COMPLETE 4+ VIEW

[knee ap]
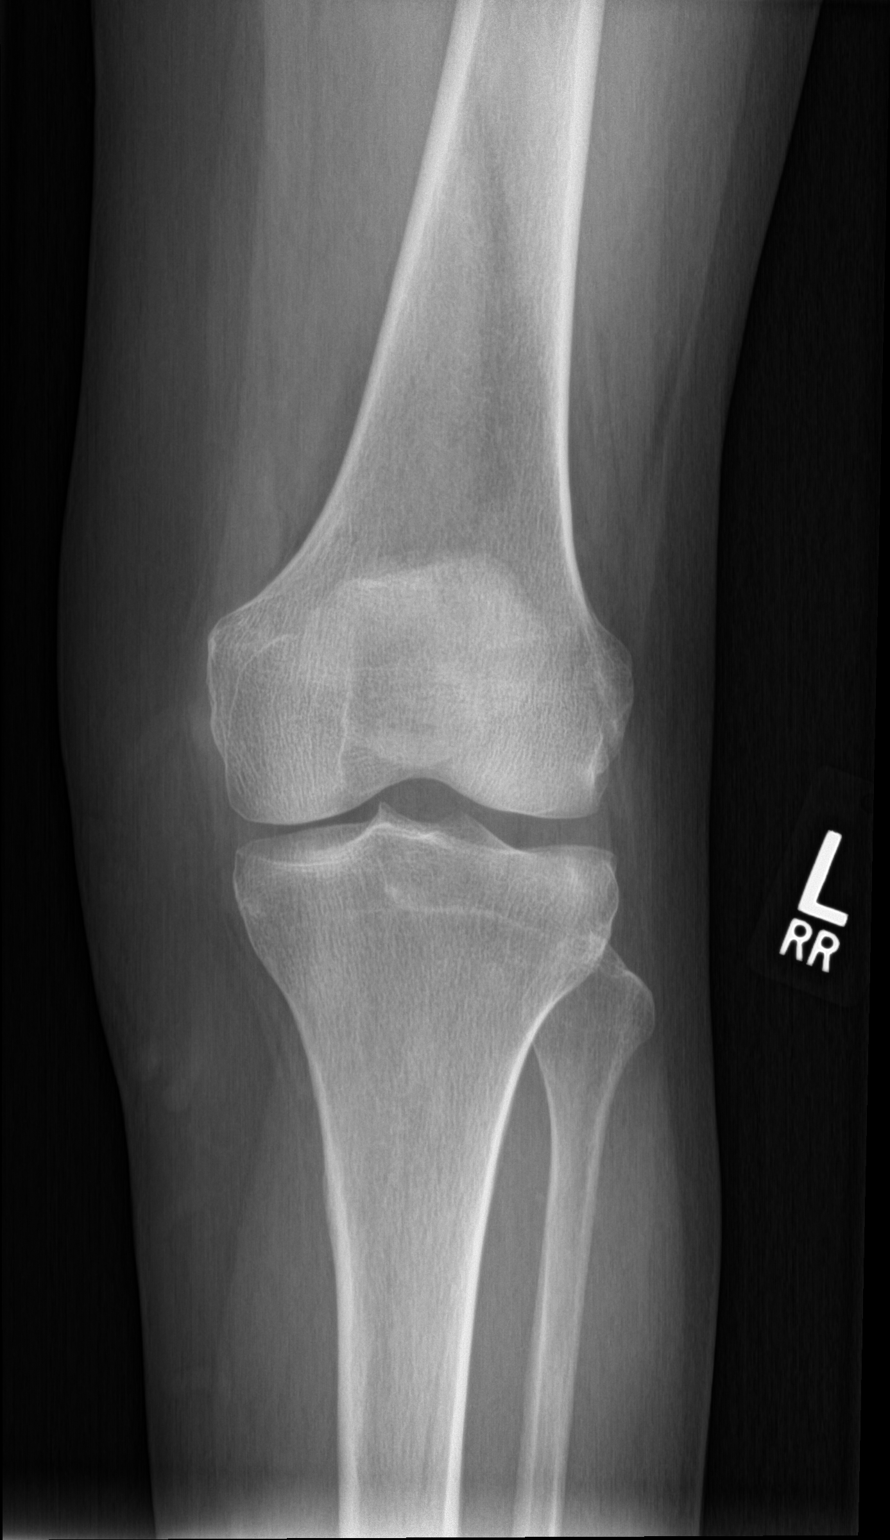

[knee obl (1 of 2)]
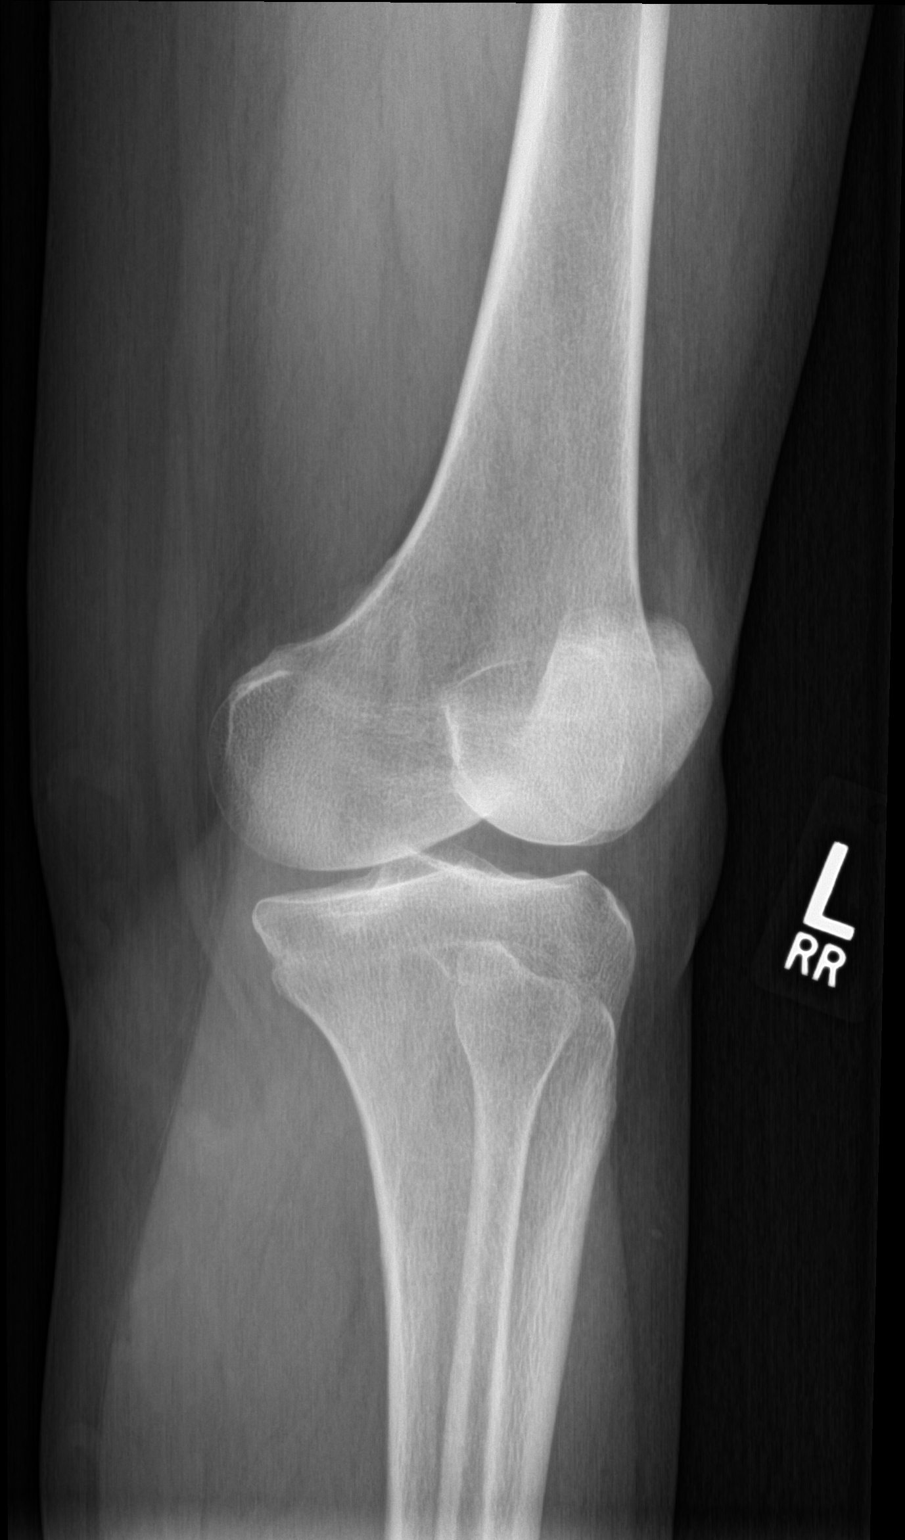

[knee obl (2 of 2)]
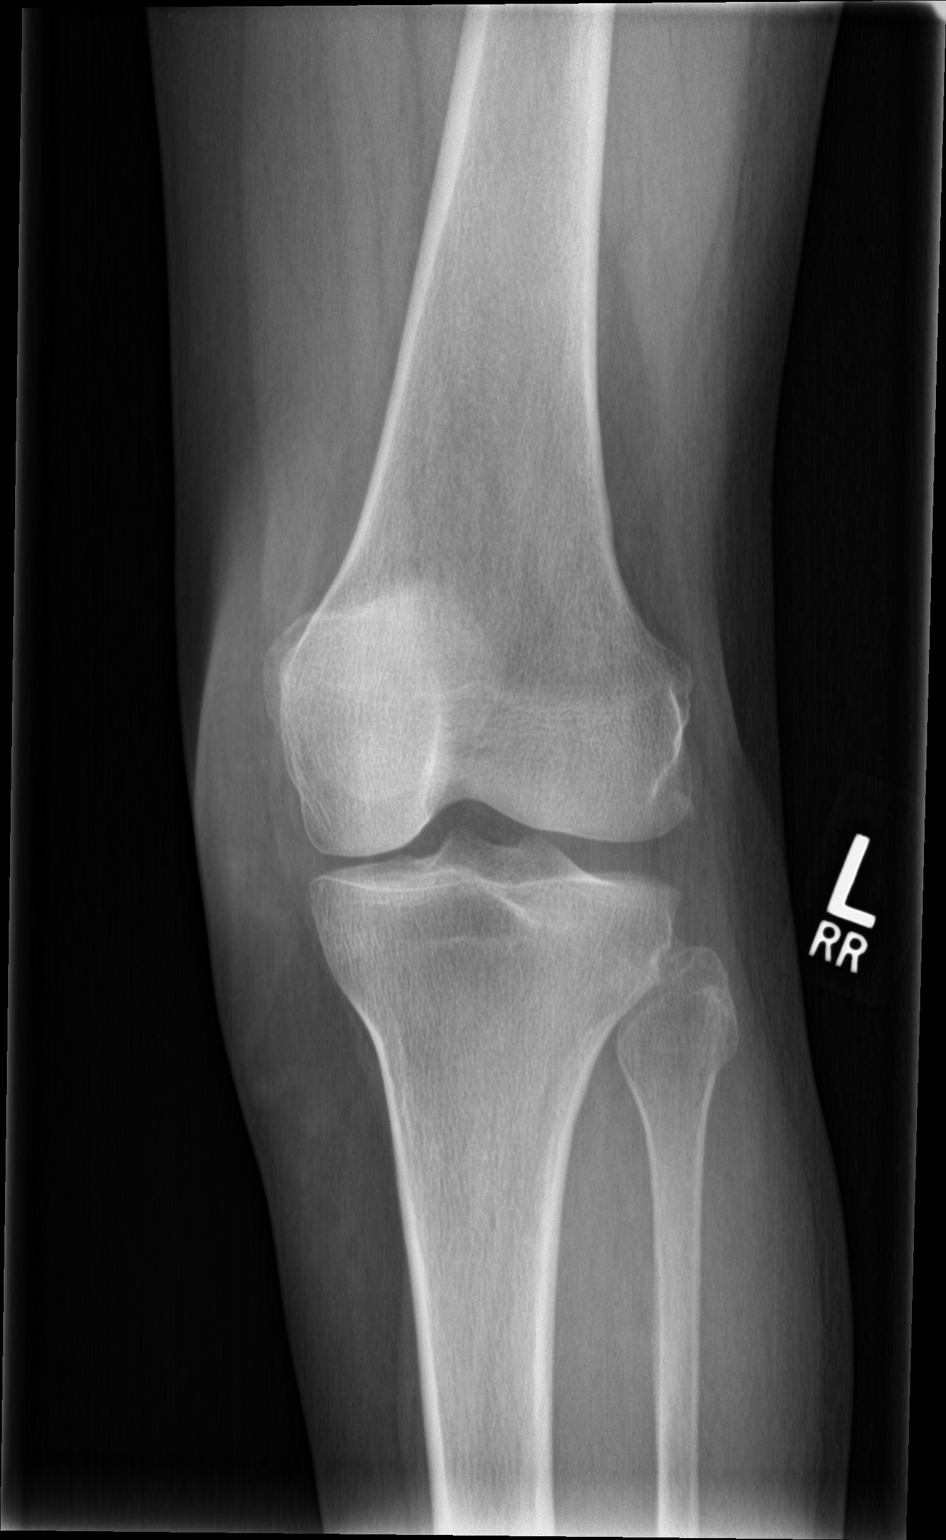

[knee lat]
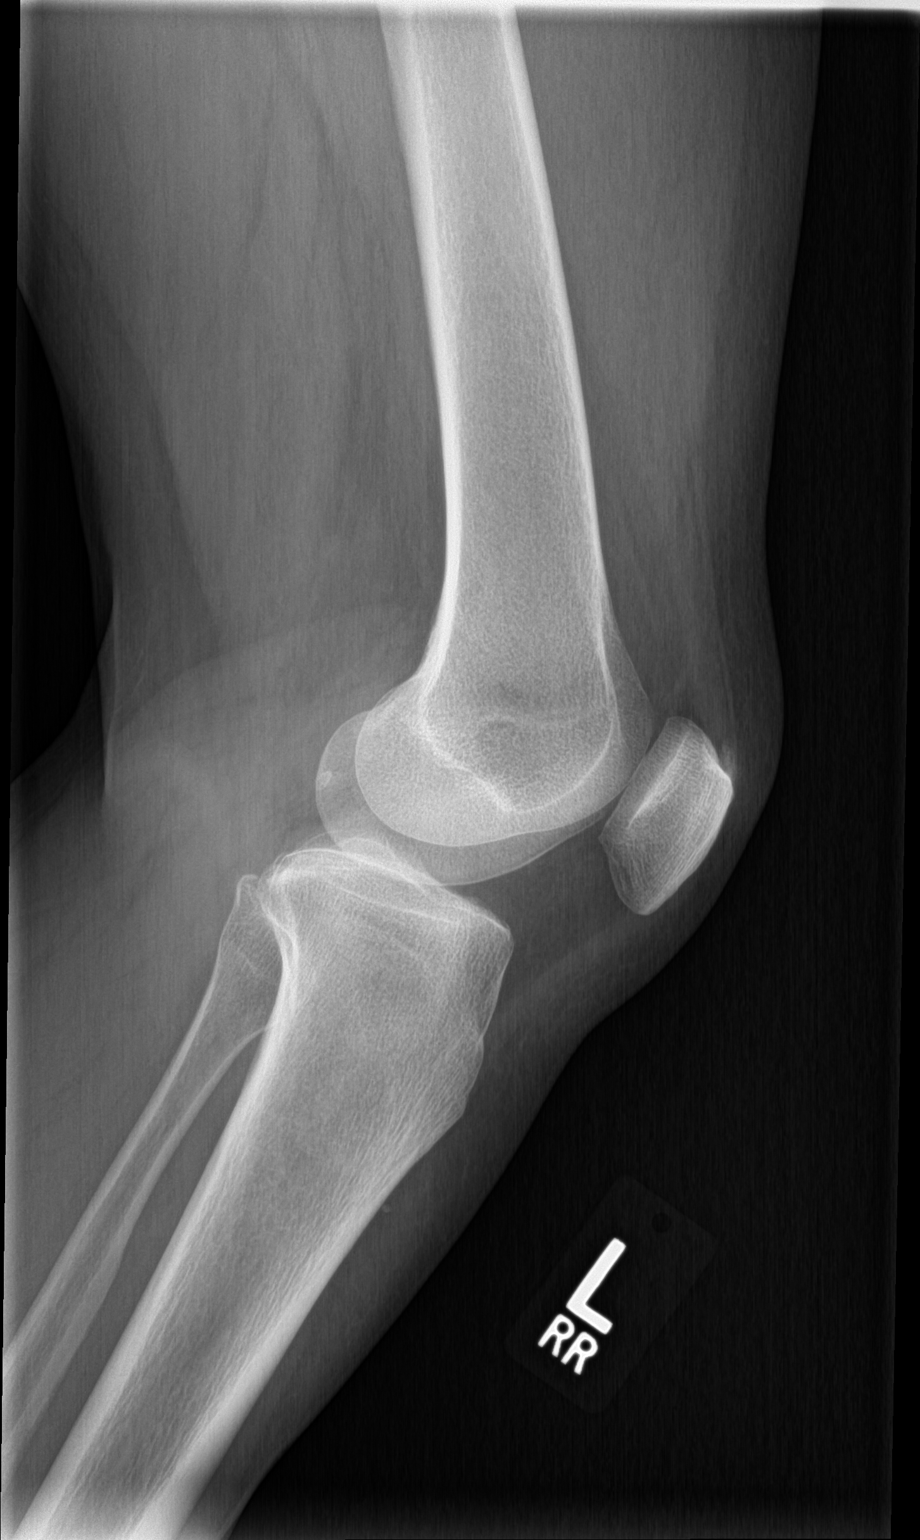

[4 of 4 positions shown; findings below may reference images not displayed]

FINDINGS: No evidence of fracture, dislocation, or joint effusion. No evidence
of arthropathy or other focal bone abnormality. Soft tissues are
unremarkable.
IMPRESSION: Negative.

## 2019-11-07 NOTE — Progress Notes (Signed)
CARDIOLOGY OFFICE NOTE  Date:  11/08/2019    Marcelino Scot Ekstrand Date of Birth: 1966/02/20 Medical Record [id number]  PCP:  Rutherford Guys, MD  Cardiologist:  Gillian Shields   No chief complaint on file.   History of Present Illness: [name] [name] is a 54 y.o. female who presents today for a follow up  History of chest pain, HTN, HLD and Anxiety Normal stress echo 09/15/17  Seen by NP 06/28/19 atypical pain and headache  ETT normal 06/13/19 Calcium score only 1.82 but 86 th percentile for age/sex Isolated to mid LAD   LDL 43 on statin  BP ok on ACE/Diuretic and beta blocker  Sees OB for cervical dysplasia and had colposcopy 08/03/19   Had interpreter with her today She is concerned because both parents had premature CAD No chest pain Some exertional dyspnea     Past Medical History:  Diagnosis Date  . Acid reflux   . Chest pain    09/15/2017 - normal stress echo  . Endometriosis   . Gastritis   . Hypertension   . Migraine   . Varicose veins     Past Surgical History:  Procedure Laterality Date  . CESAREAN SECTION     She had 5 C-Sections     Medications: Current Meds  Medication Sig  . amitriptyline (ELAVIL) 25 MG tablet Take 1 tablet (25 mg total) by mouth at bedtime.  Marland Kitchen aspirin EC 81 MG tablet Take 1 tablet (81 mg total) by mouth daily.  Marland Kitchen atorvastatin (LIPITOR) 20 MG tablet TAKE ONE TABLET BY MOUTH AT [place]  . lisinopril-hydrochlorothiazide (ZESTORETIC) 20-25 MG tablet Take 1 tablet by mouth daily.  . Multiple Vitamin (MULTIVITAMIN ADULT PO) Take 1 tablet by mouth.     Allergies: No Known Allergies  Social History: The patient  reports that she has never smoked. She has never used smokeless tobacco. She reports that she does not drink alcohol or use drugs.   Family History: The patient's family history includes Diabetes in her father; Heart attack in her father; Hypertension in her father and mother; Stroke in her father; Thyroid disease  in her mother.   Review of Systems: Please see the history of present illness.   All other systems are reviewed and negative.   Physical Exam: VS:  BP 122/80   Pulse 82   Ht 5\' 2"  (1.575 m)   Wt 155 lb (70.3 kg)   LMP 05/12/2012   SpO2 98%   BMI 28.35 kg/m  .  BMI Body mass index is 28.35 kg/m.  Wt Readings from Last 3 Encounters:  11/08/19 155 lb (70.3 kg)  08/03/19 151 lb 1.6 oz (68.5 kg)  07/04/19 151 lb 8 oz (68.7 kg)    General: Alert and in no acute distress.   HEENT: Normal.  Neck: Supple, no JVD, carotid bruits, or masses noted.  Cardiac: Regular rate and rhythm but rate is faster today. No murmurs, rubs, or gallops. No edema.  Respiratory:  Lungs are clear to auscultation bilaterally with normal work of breathing.  GI: Soft and nontender.  MS: No deformity or atrophy. Gait and ROM intact.  Skin: Warm and dry. Color is normal.  Neuro:  Strength and sensation are intact and no gross focal deficits noted.  Psych: Alert, appropriate and with normal affect.   LABORATORY DATA:  EKG:   SR rate 76 normal 05/30/19  Lab Results  Component Value Date   WBC 5.0 11/17/2018  HGB 14.1 11/17/2018   HCT 42.7 11/17/2018   PLT 251 11/17/2018   GLUCOSE 77 12/29/2018   CHOL 125 12/29/2018   TRIG 250 (H) 12/29/2018   HDL 32 (L) 12/29/2018   LDLCALC 43 12/29/2018   ALT 27 12/29/2018   AST 26 12/29/2018   NA 140 12/29/2018   K 4.6 12/29/2018   CL 102 12/29/2018   CREATININE 0.64 12/29/2018   BUN 12 12/29/2018   CO2 22 12/29/2018   TSH 3.550 12/29/2018   HGBA1C 5.8 (H) 07/01/2016     BNP (last 3 results) No results for input(s): BNP in the last 8760 hours.  ProBNP (last 3 results) No results for input(s): PROBNP in the last 8760 hours.   Other Studies Reviewed Today:  GXT Study Highlights 06/2019    Blood pressure demonstrated a normal response to exercise.  No T wave inversion was noted during stress.  There was no ST segment deviation noted during  stress.  Overall, the patient's exercise capacity was mildly impaired.  [facility] Treadmill Score: low risk   Negative stress test without evidence of ischemia at given workload.   CALCIUM SCORE IMPRESSION 06/2019: Coronary calcium score of 1.82. This was 86th percentile for age and sex matched control.   Electronically Signed   By: Pixie Casino M.D.   On: 06/13/2019 14:21  Stress Echo Study Conclusions 09/2017  - Stress ECG conclusions: There were no stress arrhythmias or conduction abnormalities. The stress ECG was negative for ischemia. - Staged echo: There was no echocardiographic evidence for stress-induced ischemia. Good exercise capacity. Normal blood pressure response to stress.  Assessment/Plan:  1. Chest Pain: atypical normal ECG multiple normal stress tests most recently 06/13/19 Observe   2. Elevated calcium score - needs aggressive CV risk factor modification - on aspirin and statin and beta blocker f/u labs for lipids and liver   3. HTN - Well controlled.  Continue current medications and low sodium Dash type diet.    4. HLD - labs as above target LDL 70 or less    5. OB:  F/u Dr Kimberlee Nearing post colposcopy for dysplasia   7. COVID-19 Education: The signs and symptoms of COVID-19 were discussed with the patient and how to seek care for testing (follow up with PCP or arrange E-visit).  The importance of social distancing, staying at home, hand hygiene and wearing a mask when out in public were discussed today.  Current medicines are reviewed with the patient today.  The patient does not have concerns regarding medicines other than what has been noted above.  The following changes have been made:  See above.  Labs/ tests ordered today include:none   No orders of the defined types were placed in this encounter.    Disposition:   FU with NP in 6 years    Patient is agreeable to this plan and will call if any problems develop in the interim.    Signed: Jenkins Rouge, MD  11/08/2019 3:55 PM  [facility] [place] [address] [place], [place]  [address] Phone: [phone] Fax: [phone]

## 2019-11-08 ENCOUNTER — Encounter: Payer: Self-pay | Admitting: Cardiovascular Disease

## 2019-11-08 ENCOUNTER — Other Ambulatory Visit: Payer: Self-pay

## 2019-11-08 ENCOUNTER — Ambulatory Visit (INDEPENDENT_AMBULATORY_CARE_PROVIDER_SITE_OTHER): Payer: Self-pay | Admitting: Cardiovascular Disease

## 2019-11-08 VITALS — BP 122/80 | HR 82 | Ht 62.0 in | Wt 155.0 lb

## 2019-11-08 DIAGNOSIS — E782 Mixed hyperlipidemia: Secondary | ICD-10-CM

## 2019-11-08 DIAGNOSIS — Z8249 Family history of ischemic heart disease and other diseases of the circulatory system: Secondary | ICD-10-CM

## 2019-11-08 NOTE — Patient Instructions (Signed)

## 2019-12-08 ENCOUNTER — Other Ambulatory Visit: Payer: Self-pay

## 2019-12-08 ENCOUNTER — Encounter: Payer: Self-pay | Admitting: Family Medicine

## 2019-12-08 ENCOUNTER — Ambulatory Visit (INDEPENDENT_AMBULATORY_CARE_PROVIDER_SITE_OTHER): Payer: Self-pay | Admitting: Family Medicine

## 2019-12-08 VITALS — BP 129/83 | HR 81 | Temp 98.0°F | Ht 62.0 in | Wt 153.6 lb

## 2019-12-08 DIAGNOSIS — G43709 Chronic migraine without aura, not intractable, without status migrainosus: Secondary | ICD-10-CM

## 2019-12-08 DIAGNOSIS — R1030 Lower abdominal pain, unspecified: Secondary | ICD-10-CM

## 2019-12-08 DIAGNOSIS — I10 Essential (primary) hypertension: Secondary | ICD-10-CM

## 2019-12-08 DIAGNOSIS — E785 Hyperlipidemia, unspecified: Secondary | ICD-10-CM

## 2019-12-08 DIAGNOSIS — N3000 Acute cystitis without hematuria: Secondary | ICD-10-CM

## 2019-12-08 LAB — POCT URINALYSIS DIP (MANUAL ENTRY)
Bilirubin, UA: NEGATIVE
Blood, UA: NEGATIVE
Glucose, UA: NEGATIVE mg/dL
Ketones, POC UA: NEGATIVE mg/dL
Nitrite, UA: NEGATIVE
Protein Ur, POC: NEGATIVE mg/dL
Spec Grav, UA: 1.02 (ref 1.010–1.025)
Urobilinogen, UA: 0.2 E.U./dL
pH, UA: 7 (ref 5.0–8.0)

## 2019-12-08 LAB — POC MICROSCOPIC URINALYSIS (UMFC): Mucus: ABSENT

## 2019-12-08 MED ORDER — LISINOPRIL-HYDROCHLOROTHIAZIDE 20-25 MG PO TABS: 1.0000 | ORAL_TABLET | Freq: Every day | ORAL | 3 refills | Status: DC

## 2019-12-08 MED ORDER — ATORVASTATIN CALCIUM 20 MG PO TABS: ORAL_TABLET | ORAL | 3 refills | Status: DC

## 2019-12-08 MED ORDER — ASPIRIN EC 81 MG PO TBEC: 81.0000 mg | DELAYED_RELEASE_TABLET | Freq: Every day | ORAL | 3 refills | Status: AC

## 2019-12-08 MED ORDER — NITROFURANTOIN MONOHYD MACRO 100 MG PO CAPS: 100.0000 mg | ORAL_CAPSULE | Freq: Two times a day (BID) | ORAL | 0 refills | Status: DC

## 2019-12-08 MED ORDER — AMITRIPTYLINE HCL 25 MG PO TABS: 25.0000 mg | ORAL_TABLET | Freq: Every day | ORAL | 3 refills | Status: DC

## 2019-12-08 MED ORDER — LISINOPRIL 20 MG PO TABS: 20.0000 mg | ORAL_TABLET | Freq: Every day | ORAL | 3 refills | Status: DC

## 2019-12-08 MED ORDER — AMLODIPINE BESYLATE 5 MG PO TABS: 5.0000 mg | ORAL_TABLET | Freq: Every day | ORAL | 3 refills | Status: DC

## 2019-12-08 MED ORDER — NORTRIPTYLINE HCL 25 MG PO CAPS: 25.0000 mg | ORAL_CAPSULE | Freq: Every day | ORAL | 3 refills | Status: DC

## 2019-12-08 NOTE — Progress Notes (Signed)
7/2/20212:21 PM  [name] [name] 1966/03/08, 54 y.o., female [id number]  Chief Complaint  Patient presents with  . Hypertension    follow up, asking for 1 yr refill  . Abdominal Pain    thinks she may have uti, having pain and burning when she urinates  . Migraine    for 1 wk, taking tylenol . Take elavil does not always work    HPI:   Patient is a 54 y.o. 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 ER visit in June 2021 for headache  Having intermittent dysuria for past several weeks No hematuria, or fever or chills No vaginal discharge  Headache was been getting worse, headache for past week headaches of recent starting occipital/neck radiates towards front, bilateral, feels like a brick Blurry vision, occ nausea, no phonophobia, photophobia only to fluorescent lights Amitriptyline does help but it makes her too sleepy If mild headache takes APAP which helps  Wondering about changing HCTZ as [place] was thinking it might be contributing to her fatigue She does have OSA - untreated due to lack of insurance  Lab Results  Component Value Date   CHOL 125 12/29/2018   HDL 32 (L) 12/29/2018   LDLCALC 43 12/29/2018   TRIG 250 (H) 12/29/2018   CHOLHDL 3.9 12/29/2018    Depression screen [facility] 2/9 12/08/2019 08/03/2019 12/29/2018  Decreased Interest 0 1 0  Down, Depressed, Hopeless 0 1 0  PHQ - 2 Score 0 2 0  Altered sleeping - 3 -  Tired, decreased energy - 3 -  Change in appetite - 1 -  Feeling bad or failure about yourself  - 0 -  Trouble concentrating - 2 -  Moving slowly or fidgety/restless - 0 -  Suicidal thoughts - 0 -  PHQ-9 Score - 11 -    Fall Risk  12/08/2019 12/29/2018  06/24/2018 05/19/2018 04/27/2018  Falls in the past year? 0 0 0 0 0  Number falls in past yr: 0 0 - - -  Injury with Fall? 0 0 - - -  Follow up - Falls evaluation completed - - -     No Known Allergies  Prior to Admission medications   Medication Sig Start Date End Date Taking? Authorizing Provider  amitriptyline (ELAVIL) 25 MG tablet Take 1 tablet (25 mg total) by mouth at bedtime. 12/08/19  Yes Rutherford Guys, MD  aspirin EC 81 MG tablet Take 1 tablet (81 mg total) by mouth daily. 12/08/19  Yes Rutherford Guys, MD  atorvastatin (LIPITOR) 20 MG tablet TAKE ONE TABLET BY MOUTH AT BEDTINE 12/08/19  Yes Rutherford Guys, MD  lisinopril-hydrochlorothiazide (ZESTORETIC) 20-25 MG tablet Take 1 tablet by mouth daily. 12/08/19  Yes Rutherford Guys, MD  Multiple Vitamin (MULTIVITAMIN ADULT PO) Take 1 tablet by mouth.   Yes [provider]    Past Medical History:  Diagnosis Date  . Acid reflux   . Chest pain    09/15/2017 - normal stress echo  . Endometriosis   . Gastritis   . Hypertension   . Migraine   . Varicose veins     Past Surgical History:  Procedure Laterality Date  . CESAREAN  SECTION     She had 5 C-Sections    Social History   Tobacco Use  . Smoking status: Never Smoker  . Smokeless tobacco: Never Used  Substance Use Topics  . Alcohol use: No    Alcohol/week: 0.0 standard drinks    Family History  Problem Relation Age of Onset  . Stroke Father   . Hypertension Father   . Diabetes Father   . Heart attack Father   . Hypertension Mother   . Thyroid disease Mother     Review of Systems  Constitutional: Negative for chills and fever.  Respiratory: Negative for cough and shortness of breath.   Cardiovascular: Negative for chest pain, palpitations and leg swelling.  Gastrointestinal: Negative for abdominal pain, nausea and vomiting.   Per hpi  OBJECTIVE:  Today's Vitals   12/08/19 1402  BP: 129/83  Pulse: 81  Temp: 98 F (36.7 C)  SpO2: 98%    Weight: 153 lb 9.6 oz (69.7 kg)  Height: 5' 2" (1.575 m)   Body mass index is 28.09 kg/m.   Physical Exam Vitals and nursing note reviewed.  Constitutional:      Appearance: She is well-developed.  HENT:     Head: Normocephalic and atraumatic.     Mouth/Throat:     Pharynx: No oropharyngeal exudate.  Eyes:     General: No scleral icterus.    Conjunctiva/sclera: Conjunctivae normal.     Pupils: Pupils are equal, round, and reactive to light.  Cardiovascular:     Rate and Rhythm: Normal rate and regular rhythm.     Heart sounds: Normal heart sounds. No murmur heard.  No friction rub. No gallop.   Pulmonary:     Effort: Pulmonary effort is normal.     Breath sounds: Normal breath sounds. No wheezing or rales.  Abdominal:     General: Bowel sounds are normal.     Palpations: Abdomen is soft.     Tenderness: There is no abdominal tenderness. There is no right CVA tenderness or left CVA tenderness.  Musculoskeletal:     Cervical back: Neck supple.  Skin:    General: Skin is warm and dry.  Neurological:     Mental Status: She is alert and oriented to person, place, and time.     Results for orders placed or performed in visit on 12/08/19 (from the past 24 hour(s))  POCT urinalysis dipstick     Status: Abnormal   Collection Time: 12/08/19  2:12 PM  Result Value Ref Range   Color, UA yellow yellow   Clarity, UA clear clear   Glucose, UA negative negative mg/dL   Bilirubin, UA negative negative   Ketones, POC UA negative negative mg/dL   Spec Grav, UA 1.020 1.010 - 1.025   Blood, UA negative negative   pH, UA 7.0 5.0 - 8.0   Protein Ur, POC negative negative mg/dL   Urobilinogen, UA 0.2 0.2 or 1.0 E.U./dL   Nitrite, UA Negative Negative   Leukocytes, UA Trace (A) Negative  POCT Microscopic Urinalysis ([facility])     Status: Abnormal   Collection Time: 12/08/19  2:57 PM  Result Value Ref Range   WBC,UR,HPF,POC Moderate (A) None WBC/hpf   RBC,UR,HPF,POC None None RBC/hpf    Bacteria Moderate (A) None, Too numerous to count   Mucus Absent Absent   Epithelial Cells, UR Per Microscopy Few (A) None, Too numerous to count cells/hpf    No results found.   ASSESSMENT and PLAN  1. Acute  cystitis without hematuria Discussed supportive measures, new meds r/se/b and RTC precautions. Patient educational handout given. - Urine Culture  2. Lower abdominal pain - POCT urinalysis dipstick - POCT Microscopic Urinalysis ([facility]) - suggestive of UTI   3. Essential hypertension, benign On controlled however patient would like to do trial of hctz given fatigue. Will therefore cont lisinopril, dc hctz and start amlodipine. Recheck nurse 2 weeks - TSH - Lipid panel - CMP14+EGFR - aspirin EC 81 MG tablet; Take 1 tablet (81 mg total) by mouth daily.  4. Hyperlipidemia, unspecified hyperlipidemia type Checking labs today, medications will be adjusted as needed.  - TSH - Lipid panel - CMP14+EGFR - atorvastatin (LIPITOR) 20 MG tablet; TAKE ONE TABLET BY MOUTH AT BEDTINE  5. Chronic migraine w/o aura w/o status migrainosus, not intractable Change amitriptyline to nortriptyline which should cause less sedation. Limiting to cost effective options.   Other orders - nortriptyline (PAMELOR) 25 MG capsule; Take 1 capsule (25 mg total) by mouth at bedtime. - lisinopril (ZESTRIL) 20 MG tablet; Take 1 tablet (20 mg total) by mouth daily. - amLODipine (NORVASC) 5 MG tablet; Take 1 tablet (5 mg total) by mouth daily. - nitrofurantoin, macrocrystal-monohydrate, (MACROBID) 100 MG capsule; Take 1 capsule (100 mg total) by mouth 2 (two) times daily.  Return for nurse BP check in 2 weeks.    Rutherford Guys, MD [facility], [place] [address] Ph.  [phone] Fax [phone]

## 2019-12-08 NOTE — Patient Instructions (Signed)
° ° ° °  If you have lab work done today you will be contacted with your lab results within the next 2 weeks.  If you have not heard from us then please contact us. The fastest way to get your results is to register for My Chart. ° ° °IF you received an x-ray today, you will receive an invoice from [facility]. Please contact [facility] at [phone] with questions or concerns regarding your invoice.  ° °IF you received labwork today, you will receive an invoice from [facility]. Please contact [facility] at [phone] with questions or concerns regarding your invoice.  ° °Our billing staff will not be able to assist you with questions regarding bills from these companies. ° °You will be contacted with the lab results as soon as they are available. The fastest way to get your results is to activate your My Chart account. Instructions are located on the last page of this paperwork. If you have not heard from us regarding the results in 2 weeks, please contact this office. °  ° ° ° °

## 2019-12-09 LAB — CMP14+EGFR
ALT: 24 IU/L (ref 0–32)
AST: 25 IU/L (ref 0–40)
Albumin/Globulin Ratio: 1.6 (ref 1.2–2.2)
Albumin: 4.6 g/dL (ref 3.8–4.9)
Alkaline Phosphatase: 115 IU/L (ref 48–121)
BUN/Creatinine Ratio: 22 (ref 9–23)
BUN: 13 mg/dL (ref 6–24)
Bilirubin Total: 0.4 mg/dL (ref 0.0–1.2)
CO2: 23 mmol/L (ref 20–29)
Calcium: 9.7 mg/dL (ref 8.7–10.2)
Chloride: 102 mmol/L (ref 96–106)
Creatinine, Ser: 0.6 mg/dL (ref 0.57–1.00)
GFR calc Af Amer: 121 mL/min/{1.73_m2} (ref >59)
GFR calc non Af Amer: 105 mL/min/{1.73_m2} (ref >59)
Globulin, Total: 2.9 g/dL (ref 1.5–4.5)
Glucose: 140 mg/dL — ABNORMAL HIGH (ref 65–99)
Potassium: 4.2 mmol/L (ref 3.5–5.2)
Sodium: 140 mmol/L (ref 134–144)
Total Protein: 7.5 g/dL (ref 6.0–8.5)

## 2019-12-09 LAB — LIPID PANEL
Chol/HDL Ratio: 4.3 ratio (ref 0.0–4.4)
Cholesterol, Total: 145 mg/dL (ref 100–199)
HDL: 34 mg/dL — ABNORMAL LOW (ref >39)
LDL Chol Calc (NIH): 68 mg/dL (ref 0–99)
Triglycerides: 265 mg/dL — ABNORMAL HIGH (ref 0–149)
VLDL Cholesterol Cal: 43 mg/dL — ABNORMAL HIGH (ref 5–40)

## 2019-12-09 LAB — TSH: TSH: 2.65 u[IU]/mL (ref 0.450–4.500)

## 2019-12-10 LAB — URINE CULTURE

## 2019-12-22 ENCOUNTER — Ambulatory Visit: Payer: Self-pay

## 2019-12-29 ENCOUNTER — Ambulatory Visit (INDEPENDENT_AMBULATORY_CARE_PROVIDER_SITE_OTHER): Payer: Self-pay | Admitting: Family Medicine

## 2019-12-29 ENCOUNTER — Ambulatory Visit: Payer: Self-pay

## 2019-12-29 ENCOUNTER — Other Ambulatory Visit: Payer: Self-pay

## 2019-12-29 VITALS — BP 117/82 | Temp 97.8°F

## 2019-12-29 DIAGNOSIS — R1030 Lower abdominal pain, unspecified: Secondary | ICD-10-CM

## 2019-12-29 LAB — POCT URINALYSIS DIP (MANUAL ENTRY)
Bilirubin, UA: NEGATIVE
Blood, UA: NEGATIVE
Glucose, UA: NEGATIVE mg/dL
Nitrite, UA: NEGATIVE
Protein Ur, POC: NEGATIVE mg/dL
Spec Grav, UA: >=1.03 — AB (ref 1.010–1.025)
Urobilinogen, UA: 0.2 E.U./dL
pH, UA: 6 (ref 5.0–8.0)

## 2019-12-29 NOTE — Patient Instructions (Signed)
° ° ° °  If you have lab work done today you will be contacted with your lab results within the next 2 weeks.  If you have not heard from us then please contact us. The fastest way to get your results is to register for My Chart. ° ° °IF you received an x-ray today, you will receive an invoice from [facility]. Please contact [facility] at [phone] with questions or concerns regarding your invoice.  ° °IF you received labwork today, you will receive an invoice from [facility]. Please contact [facility] at [phone] with questions or concerns regarding your invoice.  ° °Our billing staff will not be able to assist you with questions regarding bills from these companies. ° °You will be contacted with the lab results as soon as they are available. The fastest way to get your results is to activate your My Chart account. Instructions are located on the last page of this paperwork. If you have not heard from us regarding the results in 2 weeks, please contact this office. °  ° ° ° °

## 2019-12-29 NOTE — Addendum Note (Signed)
Addended by: Patrcia Dolly on: 12/29/2019 01:50 PM   Modules accepted: Orders

## 2020-06-04 ENCOUNTER — Other Ambulatory Visit: Payer: Self-pay

## 2020-06-19 ENCOUNTER — Other Ambulatory Visit: Payer: Self-pay

## 2020-06-19 DIAGNOSIS — Z1231 Encounter for screening mammogram for malignant neoplasm of breast: Secondary | ICD-10-CM

## 2020-08-01 ENCOUNTER — Encounter (HOSPITAL_COMMUNITY): Payer: Self-pay

## 2020-08-02 NOTE — Progress Notes (Incomplete)
CARDIOLOGY OFFICE NOTE  Date:  08/02/2020    [name] [name] Date of Birth: 25-Apr-1966 Medical Record #[id number]  PCP:  Jacelyn Pi, Lilia Argue, MD  Cardiologist:  Gillian Shields   No chief complaint on file.   History of Present Illness: [name] [name] is a 55 y.o. female who presents today for a follow up  History of chest pain, HTN, HLD and Anxiety Normal stress echo 09/15/17  Seen by NP 06/28/19 atypical pain and headache  ETT normal 06/13/19 Calcium score only 1.82 but 86 th percentile for age/sex Isolated to mid LAD   LDL 43 on statin  BP ok on ACE/Diuretic and beta blocker  Sees OB for cervical dysplasia and had colposcopy 08/03/19   Had interpreter with her today She is concerned because both parents had premature CAD No chest pain Some exertional dyspnea     Past Medical History:  Diagnosis Date  . Acid reflux   . Acute UTI 05/19/2018  . Chest pain    09/15/2017 - normal stress echo  . Chronic migraine w/o aura w/o status migrainosus, not intractable 08/10/2017  . Dysplasia of cervix, low grade (CIN 1) 10/30/2015   09/2015- LSIL 10/2015- CIN 1 09/2016- NIL- no cotest done 04/2018- NIL, +HPV 06/2019- LSIL, HPV POS 07/2019- CIN 1  . Endometriosis   . Essential hypertension, benign 07/25/2012  . Gastritis   . Hypertension   . Migraine   . OSA (obstructive sleep apnea) 12/29/2018  . Plantar fasciitis of right foot 10/05/2013  . Varicose veins     Past Surgical History:  Procedure Laterality Date  . CESAREAN SECTION     She had 5 C-Sections     Medications: No outpatient medications have been marked as taking for the 08/08/20 encounter (Appointment) with Josue Hector, MD.     Allergies: No Known Allergies  Social History: The patient  reports that she has never smoked. She has never used smokeless tobacco. She reports that she does not drink alcohol and does not use drugs.   Family History: The patient's family history includes Diabetes in her  father; Heart attack in her father; Hypertension in her father and mother; Stroke in her father; Thyroid disease in her mother.   Review of Systems: Please see the history of present illness.   All other systems are reviewed and negative.   Physical Exam: VS:  LMP 05/12/2012  .  BMI There is no height or weight on file to calculate BMI.  Wt Readings from Last 3 Encounters:  12/08/19 69.7 kg  11/08/19 70.3 kg  08/03/19 68.5 kg    General: Alert and in no acute distress.   HEENT: Normal.  Neck: Supple, no JVD, carotid bruits, or masses noted.  Cardiac: Regular rate and rhythm but rate is faster today. No murmurs, rubs, or gallops. No edema.  Respiratory:  Lungs are clear to auscultation bilaterally with normal work of breathing.  GI: Soft and nontender.  MS: No deformity or atrophy. Gait and ROM intact.  Skin: Warm and dry. Color is normal.  Neuro:  Strength and sensation are intact and no gross focal deficits noted.  Psych: Alert, appropriate and with normal affect.   LABORATORY DATA:  EKG:   SR rate 76 normal 05/30/19  Lab Results  Component Value Date   WBC 5.0 11/17/2018   HGB 14.1 11/17/2018   HCT 42.7 11/17/2018   PLT 251 11/17/2018   GLUCOSE 140 (H) 12/08/2019   CHOL  145 12/08/2019   TRIG 265 (H) 12/08/2019   HDL 34 (L) 12/08/2019   LDLCALC 68 12/08/2019   ALT 24 12/08/2019   AST 25 12/08/2019   NA 140 12/08/2019   K 4.2 12/08/2019   CL 102 12/08/2019   CREATININE 0.60 12/08/2019   BUN 13 12/08/2019   CO2 23 12/08/2019   TSH 2.650 12/08/2019   HGBA1C 5.8 (H) 07/01/2016     BNP (last 3 results) No results for input(s): BNP in the last 8760 hours.  ProBNP (last 3 results) No results for input(s): PROBNP in the last 8760 hours.   Other Studies Reviewed Today:  GXT Study Highlights 06/2019    Blood pressure demonstrated a normal response to exercise.  No T wave inversion was noted during stress.  There was no ST segment deviation noted during  stress.  Overall, the patient's exercise capacity was mildly impaired.  Duke Treadmill Score: low risk   Negative stress test without evidence of ischemia at given workload.   CALCIUM SCORE IMPRESSION 06/2019: Coronary calcium score of 1.82. This was 86th percentile for age and sex matched control.   Electronically Signed   By: [name] Casino M.D.   On: 06/13/2019 14:21  Stress Echo Study Conclusions 09/2017  - Stress ECG conclusions: There were no stress arrhythmias or conduction abnormalities. The stress ECG was negative for ischemia. - Staged echo: There was no echocardiographic evidence for stress-induced ischemia. Good exercise capacity. Normal blood pressure response to stress.  Assessment/Plan:  1. Chest Pain: atypical normal ECG multiple normal stress tests most recently 06/13/19 Observe   2. Elevated calcium score - needs aggressive CV risk factor modification - on aspirin and statin and beta blocker f/u labs for lipids and liver 06/13/19 score only 1.8 isolated area LAD but 86 th percentile as most people in this age Group have scores of 0   3. HTN - Well controlled.  Continue current medications and low sodium Dash type diet.    4. HLD - labs as above target LDL 70 or less    5. OB:  F/u Dr Kimberlee Nearing post colposcopy for dysplasia   6. Migraines:  Now on Pamelor f/u primary   Current medicines are reviewed with the patient today.  The patient does not have concerns regarding medicines other than what has been noted above.  The following changes have been made:  See above.  Labs/ tests ordered today include:none   No orders of the defined types were placed in this encounter.    Disposition:   FU in a year    Patient is agreeable to this plan and will call if any problems develop in the interim.   Signed: Jenkins Rouge, MD  08/02/2020 1:11 PM  [facility] [address] [place], [place]  [address] Phone:  [phone] Fax: [phone]

## 2020-08-08 ENCOUNTER — Ambulatory Visit: Payer: Self-pay | Admitting: Cardiovascular Disease

## 2020-08-15 ENCOUNTER — Encounter: Payer: Self-pay | Admitting: Vascular Surgery

## 2020-08-17 NOTE — Progress Notes (Deleted)
CARDIOLOGY OFFICE NOTE  Date:  08/17/2020    [name] [name] Date of Birth: [birth year] Medical Record #[id number]  PCP:  Jacelyn Pi, Lilia Argue, MD  Cardiologist:  Gillian Shields   No chief complaint on file.   History of Present Illness: [name] [name] is a 55 y.o. female who presents today for a follow up  History of chest pain, HTN, HLD and Anxiety Normal stress echo 09/15/17  Seen by NP 06/28/19 atypical pain and headache  ETT normal 06/13/19 Calcium score only 1.82 but 86 th percentile for age/sex Isolated to mid LAD   LDL 43 on statin  BP ok on ACE/Diuretic and beta blocker  Sees OB for cervical dysplasia and had colposcopy 08/03/19   Had interpreter with her today She is concerned because both parents had premature CAD No chest pain Some exertional dyspnea     Past Medical History:  Diagnosis Date  . Acid reflux   . Acute UTI 05/19/2018  . Chest pain    09/15/2017 - normal stress echo  . Chronic migraine w/o aura w/o status migrainosus, not intractable 08/10/2017  . Dysplasia of cervix, low grade (CIN 1) 10/30/2015   09/2015- LSIL 10/2015- CIN 1 09/2016- NIL- no cotest done 04/2018- NIL, +HPV 06/2019- LSIL, HPV POS 07/2019- CIN 1  . Endometriosis   . Essential hypertension, benign 07/25/2012  . Gastritis   . Hypertension   . Migraine   . OSA (obstructive sleep apnea) 12/29/2018  . Plantar fasciitis of right foot 10/05/2013  . Varicose veins     Past Surgical History:  Procedure Laterality Date  . CESAREAN SECTION     She had 5 C-Sections     Medications: No outpatient medications have been marked as taking for the 08/29/20 encounter (Appointment) with Josue Hector, MD.     Allergies: No Known Allergies  Social History: The patient  reports that she has never smoked. She has never used smokeless tobacco. She reports that she does not drink alcohol and does not use drugs.   Family History: The patient's family history includes Diabetes in her  father; Heart attack in her father; Hypertension in her father and mother; Stroke in her father; Thyroid disease in her mother.   Review of Systems: Please see the history of present illness.   All other systems are reviewed and negative.   Physical Exam: VS:  LMP 05/12/2012  .  BMI There is no height or weight on file to calculate BMI.  Wt Readings from Last 3 Encounters:  12/08/19 69.7 kg  11/08/19 70.3 kg  08/03/19 68.5 kg    General: Alert and in no acute distress.   HEENT: Normal.  Neck: Supple, no JVD, carotid bruits, or masses noted.  Cardiac: Regular rate and rhythm but rate is faster today. No murmurs, rubs, or gallops. No edema.  Respiratory:  Lungs are clear to auscultation bilaterally with normal work of breathing.  GI: Soft and nontender.  MS: No deformity or atrophy. Gait and ROM intact.  Skin: Warm and dry. Color is normal.  Neuro:  Strength and sensation are intact and no gross focal deficits noted.  Psych: Alert, appropriate and with normal affect.   LABORATORY DATA:  EKG:   SR rate 76 normal 05/30/19  Lab Results  Component Value Date   WBC 5.0 11/17/2018   HGB 14.1 11/17/2018   HCT 42.7 11/17/2018   PLT 251 11/17/2018   GLUCOSE 140 (H) 12/08/2019   CHOL  145 12/08/2019   TRIG 265 (H) 12/08/2019   HDL 34 (L) 12/08/2019   LDLCALC 68 12/08/2019   ALT 24 12/08/2019   AST 25 12/08/2019   NA 140 12/08/2019   K 4.2 12/08/2019   CL 102 12/08/2019   CREATININE 0.60 12/08/2019   BUN 13 12/08/2019   CO2 23 12/08/2019   TSH 2.650 12/08/2019   HGBA1C 5.8 (H) 07/01/2016     BNP (last 3 results) No results for input(s): BNP in the last 8760 hours.  ProBNP (last 3 results) No results for input(s): PROBNP in the last 8760 hours.   Other Studies Reviewed Today:  GXT Study Highlights 06/2019    Blood pressure demonstrated a normal response to exercise.  No T wave inversion was noted during stress.  There was no ST segment deviation noted during  stress.  Overall, the patient's exercise capacity was mildly impaired.  Duke Treadmill Score: low risk   Negative stress test without evidence of ischemia at given workload.   CALCIUM SCORE IMPRESSION 06/2019: Coronary calcium score of 1.82. This was 86th percentile for age and sex matched control.   Electronically Signed   By: Pixie Casino M.D.   On: 06/13/2019 14:21  Stress Echo Study Conclusions 09/2017  - Stress ECG conclusions: There were no stress arrhythmias or conduction abnormalities. The stress ECG was negative for ischemia. - Staged echo: There was no echocardiographic evidence for stress-induced ischemia. Good exercise capacity. Normal blood pressure response to stress.  Assessment/Plan:  1. Chest Pain: atypical normal ECG multiple normal stress tests most recently 06/13/19 Observe   2. Elevated calcium score - needs aggressive CV risk factor modification - on aspirin and statin and beta blocker f/u labs for lipids and liver 06/13/19 score only 1.8 isolated area LAD but 86 th percentile as most people in this age Group have scores of 0   3. HTN - Well controlled.  Continue current medications and low sodium Dash type diet.    4. HLD - labs as above target LDL 70 or less    5. OB:  F/u Dr Kimberlee Nearing post colposcopy for dysplasia   6. Migraines:  Now on Pamelor f/u primary   Current medicines are reviewed with the patient today.  The patient does not have concerns regarding medicines other than what has been noted above.  The following changes have been made:  See above.  Labs/ tests ordered today include:none   No orders of the defined types were placed in this encounter.    Disposition:   FU in a year    Patient is agreeable to this plan and will call if any problems develop in the interim.   Signed: Jenkins Rouge, MD  08/17/2020 2:20 PM  [facility] [address] [place], [place]  [address] Phone:  [phone] Fax: [phone]

## 2020-08-27 ENCOUNTER — Ambulatory Visit: Payer: Self-pay

## 2020-08-29 ENCOUNTER — Ambulatory Visit: Payer: Self-pay | Admitting: Cardiovascular Disease

## 2020-09-05 ENCOUNTER — Ambulatory Visit: Payer: Self-pay

## 2020-09-14 ENCOUNTER — Other Ambulatory Visit: Payer: Self-pay

## 2020-09-14 DIAGNOSIS — I83893 Varicose veins of bilateral lower extremities with other complications: Secondary | ICD-10-CM

## 2020-09-17 ENCOUNTER — Other Ambulatory Visit: Payer: Self-pay

## 2020-09-17 ENCOUNTER — Ambulatory Visit (INDEPENDENT_AMBULATORY_CARE_PROVIDER_SITE_OTHER): Payer: Self-pay | Admitting: Emergency Medicine

## 2020-09-17 ENCOUNTER — Encounter: Payer: Self-pay | Admitting: Emergency Medicine

## 2020-09-17 VITALS — BP 120/60 | HR 72 | Temp 98.0°F | Ht 63.0 in | Wt 148.6 lb

## 2020-09-17 DIAGNOSIS — G43709 Chronic migraine without aura, not intractable, without status migrainosus: Secondary | ICD-10-CM

## 2020-09-17 DIAGNOSIS — I10 Essential (primary) hypertension: Secondary | ICD-10-CM

## 2020-09-17 DIAGNOSIS — R42 Dizziness and giddiness: Secondary | ICD-10-CM

## 2020-09-17 DIAGNOSIS — G4733 Obstructive sleep apnea (adult) (pediatric): Secondary | ICD-10-CM

## 2020-09-17 DIAGNOSIS — E785 Hyperlipidemia, unspecified: Secondary | ICD-10-CM

## 2020-09-17 LAB — CBC WITH DIFFERENTIAL/PLATELET
Basophils Absolute: 0 10*3/uL (ref 0.0–0.1)
Basophils Relative: 0.5 % (ref 0.0–3.0)
Eosinophils Absolute: 0.2 10*3/uL (ref 0.0–0.7)
Eosinophils Relative: 4 % (ref 0.0–5.0)
HCT: 41.6 % (ref 36.0–46.0)
Hemoglobin: 14 g/dL (ref 12.0–15.0)
Lymphocytes Relative: 39.8 % (ref 12.0–46.0)
Lymphs Abs: 1.6 10*3/uL (ref 0.7–4.0)
MCHC: 33.7 g/dL (ref 30.0–36.0)
MCV: 82.4 fl (ref 78.0–100.0)
Monocytes Absolute: 0.4 10*3/uL (ref 0.1–1.0)
Monocytes Relative: 10.3 % (ref 3.0–12.0)
Neutro Abs: 1.9 10*3/uL (ref 1.4–7.7)
Neutrophils Relative %: 45.4 % (ref 43.0–77.0)
Platelets: 215 10*3/uL (ref 150.0–400.0)
RBC: 5.04 Mil/uL (ref 3.87–5.11)
RDW: 14.2 % (ref 11.5–15.5)
WBC: 4.1 10*3/uL (ref 4.0–10.5)

## 2020-09-17 LAB — COMPREHENSIVE METABOLIC PANEL
ALT: 24 U/L (ref 0–35)
AST: 24 U/L (ref 0–37)
Albumin: 4.2 g/dL (ref 3.5–5.2)
Alkaline Phosphatase: 86 U/L (ref 39–117)
BUN: 14 mg/dL (ref 6–23)
CO2: 29 mEq/L (ref 19–32)
Calcium: 10.2 mg/dL (ref 8.4–10.5)
Chloride: 104 mEq/L (ref 96–112)
Creatinine, Ser: 0.65 mg/dL (ref 0.40–1.20)
GFR: 100.44 mL/min (ref >60.00)
Glucose, Bld: 88 mg/dL (ref 70–99)
Potassium: 3.9 mEq/L (ref 3.5–5.1)
Sodium: 140 mEq/L (ref 135–145)
Total Bilirubin: 0.8 mg/dL (ref 0.2–1.2)
Total Protein: 7.6 g/dL (ref 6.0–8.3)

## 2020-09-17 LAB — LIPID PANEL
Cholesterol: 178 mg/dL (ref 0–200)
HDL: 41.8 mg/dL (ref >39.00)
LDL Cholesterol: 111 mg/dL — ABNORMAL HIGH (ref 0–99)
NonHDL: 135.72
Total CHOL/HDL Ratio: 4
Triglycerides: 124 mg/dL (ref 0.0–149.0)
VLDL: 24.8 mg/dL (ref 0.0–40.0)

## 2020-09-17 LAB — HEMOGLOBIN A1C: Hgb A1c MFr Bld: 6 % (ref 4.6–6.5)

## 2020-09-17 LAB — TSH: TSH: 2.24 u[IU]/mL (ref 0.35–4.50)

## 2020-09-17 MED ORDER — ATORVASTATIN CALCIUM 20 MG PO TABS: ORAL_TABLET | ORAL | 3 refills | Status: DC

## 2020-09-17 MED ORDER — AMITRIPTYLINE HCL 25 MG PO TABS: 25.0000 mg | ORAL_TABLET | Freq: Every day | ORAL | 3 refills | Status: DC

## 2020-09-17 MED ORDER — LISINOPRIL-HYDROCHLOROTHIAZIDE 20-12.5 MG PO TABS: 1.0000 | ORAL_TABLET | Freq: Every day | ORAL | 3 refills | Status: DC

## 2020-09-17 NOTE — Patient Instructions (Signed)
Mareos Dizziness Los mareos son un problema muy frecuente. Causan sensacin de inestabilidad o de desvanecimiento. Puede sentir que se va a desmayar. [employer] pueden provocarle una lesin si se tropieza o se cae. La causa puede deberse a [employer], tales como los siguientes:  Medicamentos.  No tener suficiente agua en el cuerpo (deshidratacin).  Enfermedad. Siga estas indicaciones en su casa: Comida y bebida  Beba suficiente lquido para mantener el pis (orina) claro o de color amarillo plido. Esto evita la deshidratacin. Trate de beber ms lquidos transparentes, como agua.  No beba alcohol.  Limite la cantidad de cafena que bebe o come si el mdico se lo indica.  Limite la cantidad de sal (sodio) que bebe o come si el mdico se lo indica.   Actividad  Evite los movimientos rpidos. ? Cuando se levante de una silla, sujtese hasta sentirse bien. ? Por la maana, sintese primero a un lado de la cama. Cuando se sienta bien, pngase lentamente de pie mientras se sostiene de algo. Haga esto hasta que se sienta seguro en cuanto al equilibrio.  Mueva las piernas con frecuencia si debe estar de pie en un lugar durante mucho tiempo. Mientras est de pie, contraiga y relaje los msculos de las piernas.  No conduzca vehculos ni opere maquinaria pesada si se siente mareado.  Evite agacharse si se siente mareado. En su casa, coloque los objetos en algn lugar que le resulte fcil alcanzarlos sin agacharse.   Estilo de vida  No consuma ningn producto que contenga nicotina o tabaco, como cigarrillos y Psychologist, sport and exercise. Si necesita ayuda para dejar de fumar, consulte al mdico.  Intente bajar el nivel de estrs. Para hacerlo, puede usar mtodos como el yoga o la meditacin. Hable con el mdico si necesita ayuda. Instrucciones generales  Controle sus mareos para ver si hay cambios.  Tome los medicamentos de venta libre y los recetados solamente como se lo haya indicado  el mdico. Hable con el mdico si cree que la causa de sus mareos es algn medicamento que est tomando.  Infrmele a un amigo o a un familiar si se siente mareado. Pdale a esta persona que llame al mdico si observa cambios en su comportamiento.  Concurra a todas las visitas de control como se lo haya indicado el mdico. Esto es importante. Comunquese con un mdico si:  [employer].  [employer] o la sensacin de Engineer, petroleum.  Siente malestar estomacal (nuseas).  Tiene problemas para escuchar.  Aparecen nuevos sntomas.  Siente inestabilidad al estar de pie.  Siente que la Development worker, international aid vueltas. Solicite ayuda de inmediato si:  Vomita o tiene heces acuosas (diarrea), y no puede comer o beber nada.  Tiene dificultad para hacer lo siguiente: ? Hablar. ? Caminar. ? Tragar. ? Usar los brazos, las [place] piernas.  Se siente constantemente dbil.  No piensa con claridad o tiene dificultad para armar oraciones. Es posible que un amigo o un familiar adviertan que esto ocurre.  Tiene los siguientes sntomas: ? Tourist information centre manager. ? Dolor en el vientre (abdomen). ? Falta de aire. ? Sudoracin.  Cambios en la visin.  Sangrado.  Dolor de cabeza muy intenso.  Dolor o rigidez en el cuello.  Cristy Hilts. Estos sntomas pueden Sales executive. No espere hasta que los sntomas desaparezcan. Solicite atencin mdica de inmediato. Comunquese con el servicio de emergencias de su localidad (911 en los [place]). No conduzca por sus propios medios [employer]. Resumen  [employer] causan sensacin de inestabilidad o de desvanecimiento. Puede sentir que se va a desmayar.  Beba suficiente lquido para mantener el pis (orina) claro o de color amarillo plido. No beba alcohol.  Evite los movimientos rpidos si se siente mareado.  Controle sus mareos para ver si hay cambios. Esta informacin no tiene Marine scientist el consejo del  mdico. Asegrese de hacerle al mdico cualquier pregunta que tenga. Document Revised: 11/26/2016 Document Reviewed: 11/26/2016 Elsevier Patient Education  [place].

## 2020-09-17 NOTE — Progress Notes (Signed)
BP Readings from Last 3 Encounters:  09/17/20 120/60  12/29/19 117/82  12/08/19 129/83   Lab Results  Component Value Date   HGBA1C 5.8 (H) 07/01/2016   Lab Results  Component Value Date   CHOL 145 12/08/2019   HDL 34 (L) 12/08/2019   LDLCALC 68 12/08/2019   TRIG 265 (H) 12/08/2019   CHOLHDL 4.3 12/08/2019   Lab Results  Component Value Date   CREATININE 0.60 12/08/2019   BUN 13 12/08/2019   NA 140 12/08/2019   K 4.2 12/08/2019   CL 102 12/08/2019   CO2 23 12/08/2019   Wt Readings from Last 3 Encounters:  09/17/20 148 lb 9.6 oz (67.4 kg)  12/08/19 153 lb 9.6 oz (69.7 kg)  11/08/19 155 lb (70.3 kg)   Ernestyne P Harbor 55 y.o.   Chief Complaint  Patient presents with  . Dizziness    Started Jan with nausea    HISTORY OF PRESENT ILLNESS: This is a 55 y.o. female complaining of dizziness and vertigo that started 2 months ago. Former patient of Dr. Pamella Pert.  Originally from [place]. Mother recently died from Covid infection.  Emotionally upset. Recently went to [place] and was diagnosed with diabetes and glucose of 459.  Started on Metformin.  Took medication for 4 weeks.  Took it last 1 week ago.  Blood sugars at home average 110.  Was also told her triglycerides level were very high.  Presently on atorvastatin 20 mg daily.  Has been on amitriptyline 25 mg at bedtime for a long time. Also has history of hypertension on Zestril 20-25 mg daily.  Doing well. No other complaints or medical concerns today.  HPI   Prior to Admission medications   Medication Sig Start Date End Date Taking? Authorizing Provider  aspirin EC 81 MG tablet Take 1 tablet (81 mg total) by mouth daily. 12/08/19  Yes Jacelyn Pi, Lilia Argue, MD  atorvastatin (LIPITOR) 20 MG tablet TAKE ONE TABLET BY MOUTH AT BEDTINE 12/08/19  Yes Jacelyn Pi, Lilia Argue, MD  lisinopril-hydrochlorothiazide (ZESTORETIC) 20-12.5 MG tablet Take 1 tablet by mouth daily.   Yes [provider]  Multiple Vitamin  (MULTIVITAMIN ADULT PO) Take 1 tablet by mouth.   Yes [provider]  nortriptyline (PAMELOR) 25 MG capsule Take 1 capsule (25 mg total) by mouth at bedtime. 12/08/19  Yes Jacelyn Pi, Lilia Argue, MD  amLODipine (NORVASC) 5 MG tablet Take 1 tablet (5 mg total) by mouth daily. Patient not taking: Reported on 09/17/2020 12/08/19   Jacelyn Pi, Lilia Argue, MD  lisinopril (ZESTRIL) 20 MG tablet Take 1 tablet (20 mg total) by mouth daily. Patient not taking: Reported on 09/17/2020 12/08/19   Jacelyn Pi, Lilia Argue, MD  nitrofurantoin, macrocrystal-monohydrate, (MACROBID) 100 MG capsule Take 1 capsule (100 mg total) by mouth 2 (two) times daily. Patient not taking: Reported on 09/17/2020 12/08/19   Jacelyn Pi, Lilia Argue, MD    No Known Allergies  Patient Active Problem List   Diagnosis Date Noted  . Hyperlipidemia 12/29/2018  . FHx: early MI 12/29/2018  . OSA (obstructive sleep apnea) 12/29/2018  . Depression 12/29/2018  . Chronic migraine w/o aura w/o status migrainosus, not intractable 08/10/2017  . Medication overuse headache 08/10/2017  . Dysplasia of cervix, low grade (CIN 1) 10/30/2015  . Essential hypertension, benign 07/25/2012  . Varicose veins 07/07/2011    Past Medical History:  Diagnosis Date  . Acid reflux   . Acute UTI 05/19/2018  . Chest pain  09/15/2017 - normal stress echo  . Chronic migraine w/o aura w/o status migrainosus, not intractable 08/10/2017  . Dysplasia of cervix, low grade (CIN 1) 10/30/2015   09/2015- LSIL 10/2015- CIN 1 09/2016- NIL- no cotest done 04/2018- NIL, +HPV 06/2019- LSIL, HPV POS 07/2019- CIN 1  . Endometriosis   . Essential hypertension, benign 07/25/2012  . Gastritis   . Hypertension   . Migraine   . OSA (obstructive sleep apnea) 12/29/2018  . Plantar fasciitis of right foot 10/05/2013  . Varicose veins     Past Surgical History:  Procedure Laterality Date  . CESAREAN SECTION     She had 5 C-Sections    Social History   Socioeconomic History   . Marital status: Married    Spouse name: Not on file  . Number of children: 5  . Years of education: Not on file  . Highest education level: Bachelor's degree (e.g., BA, AB, BS)  Occupational History  . Not on file  Tobacco Use  . Smoking status: Never Smoker  . Smokeless tobacco: Never Used  Vaping Use  . Vaping Use: Never used  Substance and Sexual Activity  . Alcohol use: No    Alcohol/week: 0.0 standard drinks  . Drug use: No  . Sexual activity: Yes    Birth control/protection: Post-menopausal  Other Topics Concern  . Not on file  Social History Narrative   Lives at home with her husband and children   Drinks 1 cup of decaf coffee daily, sometimes drinks soda, mostly water   Right handed   Social Determinants of Health   Financial Resource Strain: Not on file  Food Insecurity: Not on file  Transportation Needs: Not on file  Physical Activity: Not on file  Stress: Not on file  Social Connections: Not on file  Intimate Partner Violence: Not on file    Family History  Problem Relation Age of Onset  . Stroke Father   . Hypertension Father   . Diabetes Father   . Heart attack Father   . Hypertension Mother   . Thyroid disease Mother      Review of Systems  Constitutional: Negative.  Negative for chills and fever.  HENT: Negative.  Negative for congestion, hearing loss, sore throat and tinnitus.   Eyes: Negative.  Negative for blurred vision and double vision.  Respiratory: Negative.  Negative for cough and shortness of breath.   Cardiovascular: Negative.  Negative for chest pain and palpitations.  Gastrointestinal: Positive for nausea. Negative for abdominal pain, blood in stool, diarrhea, melena and vomiting.  Genitourinary: Negative.  Negative for dysuria and hematuria.  Musculoskeletal: Negative.  Negative for back pain, myalgias and neck pain.  Skin: Negative.  Negative for rash.  Neurological: Positive for dizziness. Negative for headaches.       Today's Vitals   09/17/20 0923  BP: 120/60  Pulse: 72  Temp: 98 F (36.7 C)  TempSrc: Oral  SpO2: 98%  Weight: 148 lb 9.6 oz (67.4 kg)  Height: 5\' 3"  (1.6 m)   Body mass index is 26.32 kg/m.  Physical Exam Vitals reviewed.  Constitutional:      Appearance: Normal appearance.  HENT:     Head: Normocephalic.     Right Ear: Tympanic membrane, ear canal and external ear normal.     Left Ear: Tympanic membrane, ear canal and external ear normal.     Nose: Nose normal.     Mouth/Throat:     Mouth: Mucous membranes are moist.  Pharynx: Oropharynx is clear.  Eyes:     Extraocular Movements: Extraocular movements intact.     Conjunctiva/sclera: Conjunctivae normal.     Pupils: Pupils are equal, round, and reactive to light.  Neck:     Vascular: No carotid bruit.  Cardiovascular:     Rate and Rhythm: Normal rate and regular rhythm.     Pulses: Normal pulses.     Heart sounds: Normal heart sounds.  Pulmonary:     Effort: Pulmonary effort is normal.     Breath sounds: Normal breath sounds.  Musculoskeletal:        General: Normal range of motion.     Cervical back: Normal range of motion and neck supple. No tenderness.     Right lower leg: No edema.     Left lower leg: No edema.  Lymphadenopathy:     Cervical: No cervical adenopathy.  Skin:    General: Skin is warm and dry.     Capillary Refill: Capillary refill takes less than 2 seconds.  Neurological:     General: No focal deficit present.     Mental Status: She is alert and oriented to person, place, and time.  Psychiatric:        Mood and Affect: Mood normal.        Behavior: Behavior normal.      ASSESSMENT & PLAN: Differential diagnosis of vertigo and dizziness discussed with patient. Suspect significant emotional component to her symptoms. We will do diagnostic work-up and rule out several metabolic conditions including new onset diabetes. Natika was seen today for dizziness.  Diagnoses and all orders  for this visit:  Dizziness -     Hemoglobin A1c -     Comprehensive metabolic panel -     CBC with Differential/Platelet -     TSH -     Lipid panel  Essential hypertension, benign  OSA (obstructive sleep apnea)  Chronic migraine w/o aura w/o status migrainosus, not intractable  Hyperlipidemia, unspecified hyperlipidemia type -     atorvastatin (LIPITOR) 20 MG tablet; TAKE ONE TABLET BY MOUTH AT BEDTINE  Other orders -     lisinopril-hydrochlorothiazide (ZESTORETIC) 20-12.5 MG tablet; Take 1 tablet by mouth daily. -     amitriptyline (ELAVIL) 25 MG tablet; Take 1 tablet (25 mg total) by mouth at bedtime.    Patient Instructions   Mareos Dizziness Los mareos son un problema muy frecuente. Causan sensacin de inestabilidad o de desvanecimiento. Puede sentir que se va a desmayar. [employer] pueden provocarle una lesin si se tropieza o se cae. La causa puede deberse a [employer], tales como los siguientes:  Medicamentos.  No tener suficiente agua en el cuerpo (deshidratacin).  Enfermedad. Siga estas indicaciones en su casa: Comida y bebida  Beba suficiente lquido para mantener el pis (orina) claro o de color amarillo plido. Esto evita la deshidratacin. Trate de beber ms lquidos transparentes, como agua.  No beba alcohol.  Limite la cantidad de cafena que bebe o come si el mdico se lo indica.  Limite la cantidad de sal (sodio) que bebe o come si el mdico se lo indica.   Actividad  Evite los movimientos rpidos. ? Cuando se levante de una silla, sujtese hasta sentirse bien. ? Por la maana, sintese primero a un lado de la cama. Cuando se sienta bien, pngase lentamente de pie mientras se sostiene de algo. Haga esto hasta que se sienta seguro en cuanto al equilibrio.  Mueva las piernas con frecuencia si  debe estar de pie en un lugar durante mucho tiempo. Mientras est de pie, contraiga y relaje los msculos de las piernas.  No conduzca vehculos ni  opere maquinaria pesada si se siente mareado.  Evite agacharse si se siente mareado. En su casa, coloque los objetos en algn lugar que le resulte fcil alcanzarlos sin agacharse.   Estilo de vida  No consuma ningn producto que contenga nicotina o tabaco, como cigarrillos y Psychologist, sport and exercise. Si necesita ayuda para dejar de fumar, consulte al mdico.  Intente bajar el nivel de estrs. Para hacerlo, puede usar mtodos como el yoga o la meditacin. Hable con el mdico si necesita ayuda. Instrucciones generales  Controle sus mareos para ver si hay cambios.  Tome los medicamentos de venta libre y los recetados solamente como se lo haya indicado el mdico. Hable con el mdico si cree que la causa de sus mareos es algn medicamento que est tomando.  Infrmele a un amigo o a un familiar si se siente mareado. Pdale a esta persona que llame al mdico si observa cambios en su comportamiento.  Concurra a todas las visitas de control como se lo haya indicado el mdico. Esto es importante. Comunquese con un mdico si:  [employer].  [employer] o la sensacin de Engineer, petroleum.  Siente malestar estomacal (nuseas).  Tiene problemas para escuchar.  Aparecen nuevos sntomas.  Siente inestabilidad al estar de pie.  Siente que la Development worker, international aid vueltas. Solicite ayuda de inmediato si:  Vomita o tiene heces acuosas (diarrea), y no puede comer o beber nada.  Tiene dificultad para hacer lo siguiente: ? Hablar. ? Caminar. ? Tragar. ? Usar los brazos, las [place] piernas.  Se siente constantemente dbil.  No piensa con claridad o tiene dificultad para armar oraciones. Es posible que un amigo o un familiar adviertan que esto ocurre.  Tiene los siguientes sntomas: ? Tourist information centre manager. ? Dolor en el vientre (abdomen). ? Falta de aire. ? Sudoracin.  Cambios en la visin.  Sangrado.  Dolor de cabeza muy intenso.  Dolor o rigidez en el  cuello.  Cristy Hilts. Estos sntomas pueden Sales executive. No espere hasta que los sntomas desaparezcan. Solicite atencin mdica de inmediato. Comunquese con el servicio de emergencias de su localidad (911 en los [place]). No conduzca por sus propios medios [employer]. Resumen  Los mareos causan sensacin de inestabilidad o de desvanecimiento. Puede sentir que se va a desmayar.  Beba suficiente lquido para mantener el pis (orina) claro o de color amarillo plido. No beba alcohol.  Evite los movimientos rpidos si se siente mareado.  Controle sus mareos para ver si hay cambios. Esta informacin no tiene Marine scientist el consejo del mdico. Asegrese de hacerle al mdico cualquier pregunta que tenga. Document Revised: 11/26/2016 Document Reviewed: 11/26/2016 Elsevier Patient Education  2021 [place], MD [facility]

## 2020-09-18 ENCOUNTER — Telehealth: Payer: Self-pay | Admitting: Emergency Medicine

## 2020-09-18 ENCOUNTER — Other Ambulatory Visit: Payer: Self-pay | Admitting: Emergency Medicine

## 2020-09-18 DIAGNOSIS — R7303 Prediabetes: Secondary | ICD-10-CM

## 2020-09-18 MED ORDER — METFORMIN HCL 500 MG PO TABS: 500.0000 mg | ORAL_TABLET | Freq: Two times a day (BID) | ORAL | 3 refills | Status: AC

## 2020-09-18 NOTE — Telephone Encounter (Signed)
Blood results discussed with patient. Advised to continue cholesterol medication and start Metformin 500 mg twice a day. Follow-up in the office as scheduled.

## 2020-09-24 ENCOUNTER — Telehealth: Payer: Self-pay | Admitting: *Deleted

## 2020-09-24 NOTE — Telephone Encounter (Signed)
Faxed signed order to [facility] for bone density. Confirmation page OK.

## 2020-10-03 ENCOUNTER — Encounter: Payer: Self-pay | Admitting: Vascular Surgery

## 2020-10-03 ENCOUNTER — Ambulatory Visit (HOSPITAL_COMMUNITY)
Admission: RE | Admit: 2020-10-03 | Discharge: 2020-10-03 | Disposition: A | Discharge status: Home / Self Care | Payer: Self-pay | Source: Ambulatory Visit | Attending: Vascular Surgery | Admitting: Vascular Surgery

## 2020-10-03 ENCOUNTER — Ambulatory Visit (INDEPENDENT_AMBULATORY_CARE_PROVIDER_SITE_OTHER): Payer: Self-pay | Admitting: Vascular Surgery

## 2020-10-03 ENCOUNTER — Other Ambulatory Visit: Payer: Self-pay

## 2020-10-03 VITALS — BP 119/80 | HR 91 | Temp 98.1°F | Resp 18 | Ht 61.75 in | Wt 149.1 lb

## 2020-10-03 DIAGNOSIS — I872 Venous insufficiency (chronic) (peripheral): Secondary | ICD-10-CM

## 2020-10-03 DIAGNOSIS — I83893 Varicose veins of bilateral lower extremities with other complications: Secondary | ICD-10-CM | POA: Insufficient documentation

## 2020-10-03 DIAGNOSIS — I8393 Asymptomatic varicose veins of bilateral lower extremities: Secondary | ICD-10-CM

## 2020-10-03 NOTE — Progress Notes (Signed)
REASON FOR CONSULT:    Chronic venous disease  ASSESSMENT & PLAN:   CHRONIC VENOUS INSUFFICIENCY: Based on her duplex she does have evidence of chronic venous insufficiency.  She has CEAP C1 venous disease.  She had deep venous reflux only with really no significant superficial venous reflux.  She does not have any enlarged varicose veins but only spider veins.  We have discussed importance of intermittent leg elevation and the proper positioning for this.  We have also fitted her for compression stockings.  I have encouraged her to avoid prolonged sitting and standing.  We discussed importance of exercise specifically walking and water aerobics.  I reassured her that her leg pain is not related to any arterial disease.  She may have some symptoms related to venous hypertension.  She also Complains of Some Back Pain so Certainly Some of Her Leg Pain Could Be Related to Back Issues.  If her back pain worsens I think she would need to be evaluated further by neurosurgery or orthopedics.  I be happy to see her back at any time if her venous symptoms progress.   Deitra Mayo, MD Office: [phone]   HPI:   [name] [name] is a pleasant 55 y.o. female, who was seen in 2013 by Dr. Sherren Mocha Early with venous insufficiency.  I think she was being considered for laser ablation of the great saphenous veins.  On my history, which is obtained through the translator, she had a venous [place] in [place] approximately [place] where she states that they made incisions in her groins.  Sounds like maybe she had a high ligation of her saphenous vein.  I do not get any history to suggest she had laser ablation or saphenous vein stripping.  Regardless her chief complaint today is pain in both legs.  Her pain is equal on both sides.  This is been going on for many years.  Her symptoms are worse at the end of the day and are aggravated by standing.  Her symptoms are alleviated somewhat with elevation.  She  does wear pantyhose style compression stockings which help some.  I do not get any history of DVT in the past.  She also complains of some back pain.  This has been chronic.  Her risk factors for peripheral vascular disease include diabetes, hypertension, hypercholesterolemia,.  She denies any family history of premature cardiovascular disease or smoking history.  Past Medical History:  Diagnosis Date  . Acid reflux   . Acute UTI 05/19/2018  . Chest pain    09/15/2017 - normal stress echo  . Chronic migraine w/o aura w/o status migrainosus, not intractable 08/10/2017  . Dysplasia of cervix, low grade (CIN 1) 10/30/2015   09/2015- LSIL 10/2015- CIN 1 09/2016- NIL- no cotest done 04/2018- NIL, +HPV 06/2019- LSIL, HPV POS 07/2019- CIN 1  . Endometriosis   . Essential hypertension, benign 07/25/2012  . Gastritis   . Hypertension   . Migraine   . OSA (obstructive sleep apnea) 12/29/2018  . Plantar fasciitis of right foot 10/05/2013  . Varicose veins     Family History  Problem Relation Age of Onset  . Stroke Father   . Hypertension Father   . Diabetes Father   . Heart attack Father   . Hypertension Mother   . Thyroid disease Mother     SOCIAL HISTORY: Social History   Socioeconomic History  . Marital status: Married    Spouse name: Not on file  .  Number of children: 5  . Years of education: Not on file  . Highest education level: Bachelor's degree (e.g., BA, AB, BS)  Occupational History  . Not on file  Tobacco Use  . Smoking status: Never Smoker  . Smokeless tobacco: Never Used  Vaping Use  . Vaping Use: Never used  Substance and Sexual Activity  . Alcohol use: No    Alcohol/week: 0.0 standard drinks  . Drug use: No  . Sexual activity: Yes    Birth control/protection: Post-menopausal  Other Topics Concern  . Not on file  Social History Narrative   Lives at home with her husband and children   Drinks 1 cup of decaf coffee daily, sometimes drinks soda, mostly water   Right  handed   Social Determinants of Health   Financial Resource Strain: Not on file  Food Insecurity: Not on file  Transportation Needs: Not on file  Physical Activity: Not on file  Stress: Not on file  Social Connections: Not on file  Intimate Partner Violence: Not on file    No Known Allergies  Current Outpatient Medications  Medication Sig Dispense Refill  . amitriptyline (ELAVIL) 25 MG tablet Take 1 tablet (25 mg total) by mouth at bedtime. 90 tablet 3  . amLODipine (NORVASC) 5 MG tablet Take 1 tablet (5 mg total) by mouth daily. (Patient not taking: Reported on 09/17/2020) 90 tablet 3  . aspirin EC 81 MG tablet Take 1 tablet (81 mg total) by mouth daily. 90 tablet 3  . atorvastatin (LIPITOR) 20 MG tablet TAKE ONE TABLET BY MOUTH AT BEDTINE 90 tablet 3  . lisinopril-hydrochlorothiazide (ZESTORETIC) 20-12.5 MG tablet Take 1 tablet by mouth daily. 90 tablet 3  . metFORMIN (GLUCOPHAGE) 500 MG tablet Take 1 tablet (500 mg total) by mouth 2 (two) times daily with a meal. 180 tablet 3  . Multiple Vitamin (MULTIVITAMIN ADULT PO) Take 1 tablet by mouth.    . nitrofurantoin, macrocrystal-monohydrate, (MACROBID) 100 MG capsule Take 1 capsule (100 mg total) by mouth 2 (two) times daily. (Patient not taking: Reported on 09/17/2020) 14 capsule 0   No current facility-administered medications for this visit.    REVIEW OF SYSTEMS:  [X]  denotes positive finding, [ ]  denotes negative finding Cardiac  Comments:  Chest pain or chest pressure:    Shortness of breath upon exertion:    Short of breath when lying flat:    Irregular heart rhythm:        Vascular    Pain in calf, thigh, or hip brought on by ambulation:    Pain in feet at night that wakes you up from your sleep:     Blood clot in your veins:    Leg swelling:         Pulmonary    Oxygen at home:    Productive cough:     Wheezing:         Neurologic    Sudden weakness in arms or legs:     Sudden numbness in arms or legs:      Sudden onset of difficulty speaking or slurred speech:    Temporary loss of vision in one eye:     Problems with dizziness:         Gastrointestinal    Blood in stool:     Vomited blood:         Genitourinary    Burning when urinating:     Blood in urine:  Psychiatric    Major depression:         Hematologic    Bleeding problems:    Problems with blood clotting too easily:        Skin    Rashes or ulcers:        Constitutional    Fever or chills:     PHYSICAL EXAM:   Vitals:   10/03/20 1323  BP: 119/80  Pulse: 91  Resp: 18  Temp: 98.1 F (36.7 C)  TempSrc: Temporal  SpO2: 98%  Weight: 149 lb 1.6 oz (67.6 kg)  Height: 5' 1.75" (1.568 m)    GENERAL: The patient is a well-nourished female, in no acute distress. The vital signs are documented above. CARDIAC: There is a regular rate and rhythm.  VASCULAR: I do not detect carotid bruits. She has palpable popliteal and pedal pulses bilaterally. She has spider veins bilaterally but no significant varicose veins and no significant leg swelling. She does have some pigmentation over some areas in her ankle but no significant hyperpigmentation in the lower leg or lipodermatosclerosis.  PULMONARY: There is good air exchange bilaterally without wheezing or rales. ABDOMEN: Soft and non-tender with normal pitched bowel sounds.  MUSCULOSKELETAL: There are no major deformities or cyanosis. NEUROLOGIC: No focal weakness or paresthesias are detected. SKIN: There are no ulcers or rashes noted. PSYCHIATRIC: The patient has a normal affect.  DATA:    VENOUS DUPLEX: I have independently interpreted her venous duplex scan today.  On the right side there is no evidence of DVT.  There is deep venous reflux noted in the common femoral vein and femoral vein.  There is no significant superficial venous reflux.  The great saphenous vein is patent although it is noncompressible in the mid and distal thigh suggesting either previous  phlebitis or previous ablation procedure.  On the left side, there is no evidence of DVT.  There is deep venous reflux involving the common femoral vein.  There is a short segment of superficial venous reflux in the mid and distal thigh on the left.  Diameters of the vein in this area range from 0.43-0.48 cm.  The vein is not visualized above that.  I suspect this must be fed by a perforator.

## 2020-10-17 ENCOUNTER — Ambulatory Visit: Payer: Self-pay | Admitting: Emergency Medicine

## 2020-10-18 ENCOUNTER — Encounter: Payer: Self-pay | Admitting: Emergency Medicine

## 2020-11-11 ENCOUNTER — Ambulatory Visit: Payer: Self-pay | Admitting: Emergency Medicine

## 2020-11-11 DIAGNOSIS — Z0289 Encounter for other administrative examinations: Secondary | ICD-10-CM

## 2020-11-12 ENCOUNTER — Telehealth: Payer: Self-pay | Admitting: Emergency Medicine

## 2020-11-12 NOTE — Telephone Encounter (Signed)
1.Medication Requested: amitriptyline (ELAVIL) 25 MG tablet, lisinopril-hydrochlorothiazide (ZESTORETIC) 20-12.5 MG tablet, atorvastatin (LIPITOR) 20 MG tablet 2. Pharmacy (Name, [facility]): [id number] - [place], [place] Phone:  [phone]  Fax:  [phone]      3. On Med List: Y   4. Last Visit with PCP: 09/17/2020  5. Next visit date with PCP: 01/27/2021   Agent: Please be advised that RX refills may take up to 3 business days. We ask that you follow-up with your pharmacy.

## 2020-11-14 ENCOUNTER — Other Ambulatory Visit: Payer: Self-pay | Admitting: Emergency Medicine

## 2020-11-14 ENCOUNTER — Other Ambulatory Visit: Payer: Self-pay | Admitting: *Deleted

## 2020-11-14 DIAGNOSIS — E785 Hyperlipidemia, unspecified: Secondary | ICD-10-CM

## 2020-11-14 MED ORDER — ATORVASTATIN CALCIUM 20 MG PO TABS: ORAL_TABLET | ORAL | 3 refills | Status: DC

## 2020-11-14 MED ORDER — AMITRIPTYLINE HCL 25 MG PO TABS: 25.0000 mg | ORAL_TABLET | Freq: Every day | ORAL | 3 refills | Status: DC

## 2020-11-14 MED ORDER — LISINOPRIL-HYDROCHLOROTHIAZIDE 20-12.5 MG PO TABS: 1.0000 | ORAL_TABLET | Freq: Every day | ORAL | 3 refills | Status: DC

## 2020-11-26 ENCOUNTER — Ambulatory Visit: Payer: Self-pay

## 2021-01-14 ENCOUNTER — Encounter: Payer: Self-pay | Admitting: Emergency Medicine

## 2021-01-14 ENCOUNTER — Ambulatory Visit (INDEPENDENT_AMBULATORY_CARE_PROVIDER_SITE_OTHER): Payer: Self-pay | Admitting: Emergency Medicine

## 2021-01-14 ENCOUNTER — Other Ambulatory Visit: Payer: Self-pay

## 2021-01-14 VITALS — BP 128/70 | HR 96 | Temp 99.0°F | Ht 61.0 in | Wt 139.0 lb

## 2021-01-14 DIAGNOSIS — Z1211 Encounter for screening for malignant neoplasm of colon: Secondary | ICD-10-CM

## 2021-01-14 DIAGNOSIS — E785 Hyperlipidemia, unspecified: Secondary | ICD-10-CM

## 2021-01-14 DIAGNOSIS — R7303 Prediabetes: Secondary | ICD-10-CM

## 2021-01-14 DIAGNOSIS — I10 Essential (primary) hypertension: Secondary | ICD-10-CM

## 2021-01-14 MED ORDER — LISINOPRIL-HYDROCHLOROTHIAZIDE 20-12.5 MG PO TABS: 1.0000 | ORAL_TABLET | Freq: Every day | ORAL | 3 refills | Status: DC

## 2021-01-14 MED ORDER — ATORVASTATIN CALCIUM 20 MG PO TABS: ORAL_TABLET | ORAL | 3 refills | Status: DC

## 2021-01-14 NOTE — Progress Notes (Signed)
Lab Results  Component Value Date   HGBA1C 6.0 09/17/2020   Lab Results  Component Value Date   CHOL 178 09/17/2020   HDL 41.80 09/17/2020   LDLCALC 111 (H) 09/17/2020   TRIG 124.0 09/17/2020   CHOLHDL 4 09/17/2020   The 10-year ASCVD risk score [name] [name] DC Jr., et al., 2013) is: 2.6%   Values used to calculate the score:     Age: 55 years     Sex: Female     Is Non-Hispanic African American: No     Diabetic: No     Tobacco smoker: No     Systolic Blood Pressure: 0000000 mmHg     Is BP treated: Yes     HDL Cholesterol: 41.8 mg/dL     Total Cholesterol: 178 mg/dL Macklyn P [name] [name] 55 y.o.   Chief Complaint  Patient presents with   Follow-up    Cholesterol check    HISTORY OF PRESENT ILLNESS: This is a 55 y.o. female here for follow-up of prediabetes, hypertension, and dyslipidemia. #1 hypertension: On Zestoretic 20-12.5 mg daily #2 dyslipidemia: On atorvastatin 20 mg daily #3 prediabetes: On metformin 500 mg daily.  Has lost weight. No complaints or medical concerns today.  HPI   Prior to Admission medications   Medication Sig Start Date End Date Taking? Authorizing Provider  amitriptyline (ELAVIL) 25 MG tablet Take 1 tablet (25 mg total) by mouth at bedtime. 11/14/20 02/12/21 Yes SagardiaInes Bloomer, MD  aspirin EC 81 MG tablet Take 1 tablet (81 mg total) by mouth daily. 12/08/19  Yes Jacelyn Pi, Lilia Argue, MD  metFORMIN (GLUCOPHAGE) 500 MG tablet Take 1 tablet (500 mg total) by mouth 2 (two) times daily with a meal. 09/18/20  Yes Moselle Rister, Ines Bloomer, MD  Multiple Vitamin (MULTIVITAMIN ADULT PO) Take 1 tablet by mouth.   Yes [provider]  atorvastatin (LIPITOR) 20 MG tablet TAKE ONE TABLET BY MOUTH AT BEDTINE 01/14/21   Horald Pollen, MD  lisinopril-hydrochlorothiazide (ZESTORETIC) 20-12.5 MG tablet Take 1 tablet by mouth daily. 01/14/21   Horald Pollen, MD  nitrofurantoin, macrocrystal-monohydrate, (MACROBID) 100 MG capsule Take 1 capsule (100 mg total)  by mouth 2 (two) times daily. Patient not taking: No sig reported 12/08/19   Jacelyn Pi, Lilia Argue, MD    No Known Allergies  Patient Active Problem List   Diagnosis Date Noted   Hyperlipidemia 12/29/2018   FHx: early MI 12/29/2018   OSA (obstructive sleep apnea) 12/29/2018   Depression 12/29/2018   Chronic migraine w/o aura w/o status migrainosus, not intractable 08/10/2017   Medication overuse headache 08/10/2017   Dysplasia of cervix, low grade (CIN 1) 10/30/2015   Essential hypertension, benign 07/25/2012   Varicose veins 07/07/2011    Past Medical History:  Diagnosis Date   Acid reflux    Acute UTI 05/19/2018   Chest pain    09/15/2017 - normal stress echo   Chronic migraine w/o aura w/o status migrainosus, not intractable 08/10/2017   Dysplasia of cervix, low grade (CIN 1) 10/30/2015   09/2015- LSIL 10/2015- CIN 1 09/2016- NIL- no cotest done 04/2018- NIL, +HPV 06/2019- LSIL, HPV POS 07/2019- CIN 1   Endometriosis    Essential hypertension, benign 07/25/2012   Gastritis    Hypertension    Migraine    OSA (obstructive sleep apnea) 12/29/2018   Plantar fasciitis of right foot 10/05/2013   Varicose veins     Past Surgical History:  Procedure Laterality Date   CESAREAN SECTION  She had 5 C-Sections    Social History   Socioeconomic History   Marital status: Married    Spouse name: Not on file   Number of children: 5   Years of education: Not on file   Highest education level: Bachelor's degree (e.g., BA, AB, BS)  Occupational History   Not on file  Tobacco Use   Smoking status: Never   Smokeless tobacco: Never  Vaping Use   Vaping Use: Never used  Substance and Sexual Activity   Alcohol use: No    Alcohol/week: 0.0 standard drinks   Drug use: No   Sexual activity: Yes    Birth control/protection: Post-menopausal  Other Topics Concern   Not on file  Social History Narrative   Lives at home with her husband and children   Drinks 1 cup of decaf coffee daily,  sometimes drinks soda, mostly water   Right handed   Social Determinants of Health   Financial Resource Strain: Not on file  Food Insecurity: Not on file  Transportation Needs: Not on file  Physical Activity: Not on file  Stress: Not on file  Social Connections: Not on file  Intimate Partner Violence: Not on file    Family History  Problem Relation Age of Onset   Stroke Father    Hypertension Father    Diabetes Father    Heart attack Father    Hypertension Mother    Thyroid disease Mother      Review of Systems  Constitutional: Negative.  Negative for chills and fever.  HENT: Negative.  Negative for congestion and sore throat.   Respiratory: Negative.  Negative for cough and shortness of breath.   Cardiovascular:  Negative for chest pain and palpitations.  Gastrointestinal:  Negative for abdominal pain, blood in stool, diarrhea, nausea and vomiting.  Genitourinary: Negative.  Negative for dysuria and hematuria.  Musculoskeletal: Negative.   Skin: Negative.  Negative for rash.  Neurological:  Negative for dizziness and headaches.  All other systems reviewed and are negative.  Today's Vitals   01/14/21 1345  BP: 128/70  Pulse: 96  Temp: 99 F (37.2 C)  TempSrc: Oral  SpO2: 98%  Weight: 139 lb (63 kg)  Height: '5\' 1"'$  (1.549 m)   Body mass index is 26.26 kg/m. Wt Readings from Last 3 Encounters:  01/14/21 139 lb (63 kg)  10/03/20 149 lb 1.6 oz (67.6 kg)  09/17/20 148 lb 9.6 oz (67.4 kg)    Physical Exam Vitals reviewed.  HENT:     Head: Normocephalic.     Right Ear: Tympanic membrane, ear canal and external ear normal.     Left Ear: Tympanic membrane, ear canal and external ear normal.  Eyes:     Extraocular Movements: Extraocular movements intact.     Conjunctiva/sclera: Conjunctivae normal.     Pupils: Pupils are equal, round, and reactive to light.  Neck:     Vascular: No carotid bruit.  Cardiovascular:     Rate and Rhythm: Normal rate and regular  rhythm.     Pulses: Normal pulses.     Heart sounds: Normal heart sounds.  Pulmonary:     Effort: Pulmonary effort is normal.     Breath sounds: Normal breath sounds.  Abdominal:     General: Bowel sounds are normal. There is no distension.     Palpations: Abdomen is soft.     Tenderness: There is no abdominal tenderness.  Musculoskeletal:        General: Normal range  of motion.     Cervical back: Normal range of motion and neck supple. No tenderness.     Right lower leg: No edema.     Left lower leg: No edema.  Lymphadenopathy:     Cervical: No cervical adenopathy.  Skin:    General: Skin is warm and dry.     Capillary Refill: Capillary refill takes less than 2 seconds.  Neurological:     General: No focal deficit present.     Mental Status: She is alert and oriented to person, place, and time.  Psychiatric:        Mood and Affect: Mood normal.        Behavior: Behavior normal.     ASSESSMENT & PLAN: Essential hypertension, benign Well-controlled hypertension.  Continue Zestoretic 20-12.5 mg daily.  Dietary approaches to stop hypertension discussed.  Hyperlipidemia Stable.  Diet and nutrition discussed.  Continue atorvastatin 20 mg daily.  Prediabetes Responded well to metformin with weight loss.  Diet and nutrition discussed.  Advised to lower amount of daily carbohydrate intake.  Amyree was seen today for follow-up.  Diagnoses and all orders for this visit:  Essential hypertension, benign -     lisinopril-hydrochlorothiazide (ZESTORETIC) 20-12.5 MG tablet; Take 1 tablet by mouth daily.  Colon cancer screening -     Ambulatory referral to Gastroenterology  Hyperlipidemia, unspecified hyperlipidemia type -     atorvastatin (LIPITOR) 20 MG tablet; TAKE ONE TABLET BY MOUTH AT [place]  Prediabetes  Patient Instructions  Hipertensin en los adultos Hypertension, Adult El trmino hipertensin es otra forma de denominar a la presin arterial elevada. La presin  arterial elevada fuerza al corazn a trabajar ms parabombear la sangre. Esto puede causar problemas con el paso del [place]. Una lectura de presin arterial est compuesta por 2 nmeros. Hay un nmero superior (sistlico) sobre un nmero inferior (diastlico). Lo ideal es tener la presin arterial por debajo de 120/80. Las elecciones saludables pueden ayudar a Engineer, materials presin arterial, o tal vez necesitemedicamentos para bajarla. Cules son las causas? Se desconoce la causa de esta afeccin. Algunas afecciones pueden estarrelacionadas con la presin arterial alta. Qu incrementa el riesgo? Fumar. Tener diabetes mellitus tipo 2, colesterol alto, o ambos. No hacer la cantidad suficiente de actividad fsica o ejercicio. Tener sobrepeso. Consumir mucha grasa, azcar, caloras o sal (sodio) en su dieta. Beber alcohol en exceso. Tener una enfermedad renal a largo plazo (crnica). Tener antecedentes familiares de presin arterial alta. Edad. Los riesgos aumentan con la edad. Raza. El riesgo es mayor para las Retail banker. Sexo. Antes de los 45 aos, los hombres corren ms [employer]. Despus de los 65 aos, las mujeres corren ms [employer]. Tener apnea obstructiva del sueo. Estrs. Cules son los signos o los sntomas? Es posible que la presin arterial alta puede no cause sntomas. La presin arterial muy alta (crisis hipertensiva) puede provocar: Dolor de cabeza. Sensaciones de preocupacin o nerviosismo (ansiedad). Falta de aire. Hemorragia nasal. Sensacin de malestar en el estmago (nuseas). Vmitos. Cambios en la forma de ver. Dolor muy intenso en el pecho. Convulsiones. Cmo se trata? Esta afeccin se trata haciendo cambios saludables en el estilo de vida, por ejemplo: Consumir alimentos saludables. Hacer ms ejercicio. Beber menos alcohol. El mdico puede recetarle medicamentos si los cambios en el estilo de vida no son suficientes para  Child psychotherapist la presin arterial y si: El nmero de arriba est por encima de 130. El nmero de abajo est por  encima de 80. Su presin arterial personal ideal puede variar. Siga estas instrucciones en su casa: Comida y bebida  Si se lo dicen, siga el plan de alimentacin de DASH (Dietary Approaches to Stop Hypertension, Maneras de alimentarse para detener la hipertensin). Para seguir este plan: Llene la mitad del plato de cada comida con frutas y verduras. Llene un cuarto del plato de cada comida con cereales integrales. Los cereales integrales incluyen pasta integral, arroz integral y pan integral. Coma y beba productos lcteos con bajo contenido de grasa, como leche descremada o yogur bajo en grasas. Llene un cuarto del plato de cada comida con protenas bajas en grasa (magras). Las protenas bajas en grasa incluyen pescado, pollo sin piel, huevos, frijoles y tofu. Evite consumir carne grasa, carne curada y procesada, o pollo con piel. Evite consumir alimentos prehechos o procesados. Consuma menos de 1500 mg de sal por da. No beba alcohol si: El mdico le indica que no lo haga. Est embarazada, puede estar embarazada o est tratando de [place]. Si bebe alcohol: Limite la cantidad que bebe a lo siguiente: De 0 a 1 medida por da para las mujeres. De 0 a 2 medidas por da para los hombres. Est atento a la cantidad de alcohol que hay en las bebidas que toma. En los [place], una medida equivale a una botella de cerveza de 12 oz (355 ml), un vaso de vino de 5 oz (148 ml) o un vaso de una bebida alcohlica de alta graduacin de 1 oz (44 ml).  Estilo de vida  Trabaje con su mdico para mantenerse en un peso saludable o para perder peso. Pregntele a su mdico cul es el peso recomendable para usted. Haga al menos 30 minutos de ejercicio la [employer] de la [place]. Estos pueden incluir caminar, nadar o andar en bicicleta. Realice al menos 30 minutos de  ejercicio que fortalezca sus msculos (ejercicios de resistencia) al menos 3 das a la [place]. Estos pueden incluir levantar pesas o hacer [place]. No consuma ningn producto que contenga nicotina o tabaco, como cigarrillos, cigarrillos electrnicos y tabaco de Higher education careers adviser. Si necesita ayuda para dejar de fumar, consulte al [employer]. Controle su presin arterial en su casa tal como le indic el mdico. Concurra a todas las visitas de seguimiento como se lo haya indicado el mdico. Esto es importante.  Medicamentos [employer] de venta libre y los recetados solamente como se lo haya indicado el mdico. Siga cuidadosamente las indicaciones. No omita las dosis de medicamentos para la presin arterial. Los medicamentos pierden eficacia si omite dosis. El hecho de omitir las dosis tambin [place] el riesgo de otros problemas. Pregntele a su mdico a qu efectos secundarios o reacciones a los Careers information officer. Comunquese con un mdico si: Piensa que tiene [place] reaccin a los medicamentos que est tomando. Tiene dolores de cabeza frecuentes (recurrentes). Se siente mareado. Tiene hinchazn en los tobillos. Tiene problemas de visin. Solicite ayuda inmediatamente si: Siente un dolor de cabeza muy intenso. Empieza a sentirse desorientado (confundido). Se siente dbil o adormecido. Siente que va a desmayarse. Tiene un dolor muy intenso en las siguientes zonas: Pecho. Vientre (abdomen). Vomita ms de una vez. Tiene dificultad para respirar. Resumen El trmino hipertensin es otra forma de denominar a la presin arterial elevada. La presin arterial elevada fuerza al corazn a trabajar ms para bombear la sangre. Para la [employer], una presin arterial normal es menor que 120/80. [place]  ayudarle a disminuir su presin arterial. Si no puede bajar su presin arterial mediante decisiones saludables, es posible que deba tomar  medicamentos. Esta informacin no tiene Marine scientist el consejo del mdico. Asegresede hacerle al mdico cualquier pregunta que tenga. Document Revised: 03/10/2018 Document Reviewed: 03/10/2018 Elsevier Patient Education  2022 [place], MD [facility]

## 2021-01-14 NOTE — Assessment & Plan Note (Signed)
Responded well to metformin with weight loss.  Diet and nutrition discussed.  Advised to lower amount of daily carbohydrate intake.

## 2021-01-14 NOTE — Assessment & Plan Note (Signed)
Well-controlled hypertension.  Continue Zestoretic 20-12.5 mg daily.  Dietary approaches to stop hypertension discussed.

## 2021-01-14 NOTE — Patient Instructions (Signed)
Hipertensi?n en los adultos ?Hypertension, Adult ?El t?rmino hipertensi?n es otra forma de denominar a la presi?n arterial elevada. La presi?n arterial elevada fuerza al coraz?n a trabajar m?s para bombear la sangre. Esto puede causar problemas con el paso del tiempo. ?Una lectura de presi?n arterial est? compuesta por 2 n?meros. Hay un n?mero superior (sist?lico) sobre un n?mero inferior (diast?lico). Lo ideal es tener la presi?n arterial por debajo de 120/80. Las elecciones saludables pueden ayudar a bajar la presi?n arterial, o tal vez necesite medicamentos para bajarla. ??Cu?les son las causas? ?Se desconoce la causa de esta afecci?n. Algunas afecciones pueden estar relacionadas con la presi?n arterial alta. ??Qu? incrementa el riesgo? ?Fumar. ?Tener diabetes mellitus tipo 2, colesterol alto, o ambos. ?No hacer la cantidad suficiente de actividad f?sica o ejercicio. ?Tener sobrepeso. ?Consumir mucha grasa, az?car, calor?as o sal (sodio) en su dieta. ?Beber alcohol en exceso. ?Tener una enfermedad renal a largo plazo (cr?nica). ?Tener antecedentes familiares de presi?n arterial alta. ?Edad. Los riesgos aumentan con la edad. ?Raza. El riesgo es mayor para las personas afroamericanas. ?Sexo. Antes de los 45 a?os, los hombres corren m?s riesgo que las mujeres. Despu?s de los 65 a?os, las mujeres corren m?s riesgo que los hombres. ?Tener apnea obstructiva del sue?o. ?Estr?s. ??Cu?les son los signos o los s?ntomas? ?Es posible que la presi?n arterial alta puede no cause s?ntomas. La presi?n arterial muy alta (crisis hipertensiva) puede provocar: ?Dolor de cabeza. ?Sensaciones de preocupaci?n o nerviosismo (ansiedad). ?Falta de aire. ?Hemorragia nasal. ?Sensaci?n de malestar en el est?mago (n?useas). ?V?mitos. ?Cambios en la forma de ver. ?Dolor muy intenso en el pecho. ?Convulsiones. ??C?mo se trata? ?Esta afecci?n se trata haciendo cambios saludables en el estilo de vida, por ejemplo: ?Consumir alimentos  saludables. ?Hacer m?s ejercicio. ?Beber menos alcohol. ?El m?dico puede recetarle medicamentos si los cambios en el estilo de vida no son suficientes para lograr controlar la presi?n arterial y si: ?El n?mero de arriba est? por encima de 130. ?El n?mero de abajo est? por encima de 80. ?Su presi?n arterial personal ideal puede variar. ?Siga estas instrucciones en su casa: ?Comida y bebida ? ?Si se lo dicen, siga el plan de alimentaci?n de DASH (Dietary Approaches to Stop Hypertension, Maneras de alimentarse para detener la hipertensi?n). Para seguir este plan: ?Llene la mitad del plato de cada comida con frutas y verduras. ?Llene un cuarto del plato de cada comida con cereales integrales. Los cereales integrales incluyen pasta integral, arroz integral y pan integral. ?Coma y beba productos l?cteos con bajo contenido de grasa, como leche descremada o yogur bajo en grasas. ?Llene un cuarto del plato de cada comida con prote?nas bajas en grasa (magras). Las prote?nas bajas en grasa incluyen pescado, pollo sin piel, huevos, frijoles y tofu. ?Evite consumir carne grasa, carne curada y procesada, o pollo con piel. ?Evite consumir alimentos prehechos o procesados. ?Consuma menos de 1500 mg de sal por d?a. ?No beba alcohol si: ?El m?dico le indica que no lo haga. ?Est? embarazada, puede estar embarazada o est? tratando de quedar embarazada. ?Si bebe alcohol: ?Limite la cantidad que bebe a lo siguiente: ?De 0 a 1 medida por d?a para las mujeres. ?De 0 a 2 medidas por d?a para los hombres. ?Est? atento a la cantidad de alcohol que hay en las bebidas que toma. En los [place], una medida equivale a una botella de cerveza de 12 oz (355 ml), un vaso de vino de 5 oz (148 ml) o un vaso de una bebida alcoh?lica de   alta graduaci?n de 1? oz (44 ml). ?Estilo de vida ? ?Trabaje con su m?dico para mantenerse en un peso saludable o para perder peso. Preg?ntele a su m?dico cu?l es el peso recomendable para usted. ?Haga al menos 30  minutos de ejercicio la mayor?a de los d?as de la semana. Estos pueden incluir caminar, nadar o andar en bicicleta. ?Realice al menos 30 minutos de ejercicio que fortalezca sus m?sculos (ejercicios de resistencia) al menos 3 d?as a la semana. Estos pueden incluir levantar pesas o hacer [place]. ?No consuma ning?n producto que contenga nicotina o tabaco, como cigarrillos, cigarrillos electr?nicos y tabaco de mascar. Si necesita ayuda para dejar de fumar, consulte al m?dico. ?Controle su presi?n arterial en su casa tal como le indic? el m?dico. ?Concurra a todas las visitas de seguimiento como se lo haya indicado el m?dico. Esto es importante. ?Medicamentos ?Tome los medicamentos de venta libre y los recetados solamente como se lo haya indicado el m?dico. Siga cuidadosamente las indicaciones. ?No omita las dosis de medicamentos para la presi?n arterial. Los medicamentos pierden eficacia si omite dosis. El hecho de omitir las dosis tambi?n aumenta el riesgo de otros problemas. ?Preg?ntele a su m?dico a qu? efectos secundarios o reacciones a los medicamentos debe prestar atenci?n. ?Comun?quese con un m?dico si: ?Piensa que tiene una reacci?n a los medicamentos que est? tomando. ?Tiene dolores de cabeza frecuentes (recurrentes). ?Se siente mareado. ?Tiene hinchaz?n en los tobillos. ?Tiene problemas de visi?n. ?Solicite ayuda inmediatamente si: ?Siente un dolor de cabeza muy intenso. ?Empieza a sentirse desorientado (confundido). ?Se siente d?bil o adormecido. ?Siente que va a desmayarse. ?Tiene un dolor muy intenso en las siguientes zonas: ?Pecho. ?Vientre (abdomen). ?Vomita m?s de una vez. ?Tiene dificultad para respirar. ?Resumen ?El t?rmino hipertensi?n es otra forma de denominar a la presi?n arterial elevada. ?La presi?n arterial elevada fuerza al coraz?n a trabajar m?s para bombear la sangre. ?Para la mayor?a de las personas, una presi?n arterial normal es menor que 120/80. ?Las decisiones saludables pueden ayudarle  a disminuir su presi?n arterial. Si no puede bajar su presi?n arterial mediante decisiones saludables, es posible que deba tomar medicamentos. ?Esta informaci?n no tiene como fin reemplazar el consejo del m?dico. Aseg?rese de hacerle al m?dico cualquier pregunta que tenga. ?Document Revised: 03/10/2018 Document Reviewed: 03/10/2018 ?Elsevier Patient Education ? 2022 Elsevier Inc. ? ?

## 2021-01-14 NOTE — Assessment & Plan Note (Signed)
Stable.  Diet and nutrition discussed. Continue atorvastatin 20 mg daily. 

## 2021-01-27 ENCOUNTER — Ambulatory Visit: Payer: Self-pay | Admitting: Emergency Medicine

## 2021-02-03 ENCOUNTER — Other Ambulatory Visit: Payer: Self-pay | Admitting: Obstetrics and Gynecology

## 2021-02-03 DIAGNOSIS — Z1231 Encounter for screening mammogram for malignant neoplasm of breast: Secondary | ICD-10-CM

## 2021-02-04 ENCOUNTER — Ambulatory Visit: Payer: Self-pay

## 2021-02-24 ENCOUNTER — Telehealth: Payer: Self-pay | Admitting: Emergency Medicine

## 2021-02-24 ENCOUNTER — Ambulatory Visit: Payer: Self-pay | Admitting: Emergency Medicine

## 2021-02-24 ENCOUNTER — Other Ambulatory Visit: Payer: Self-pay

## 2021-05-19 ENCOUNTER — Ambulatory Visit (INDEPENDENT_AMBULATORY_CARE_PROVIDER_SITE_OTHER): Payer: Self-pay | Admitting: Emergency Medicine

## 2021-05-19 ENCOUNTER — Other Ambulatory Visit: Payer: Self-pay

## 2021-05-19 ENCOUNTER — Encounter: Payer: Self-pay | Admitting: Emergency Medicine

## 2021-05-19 VITALS — BP 118/80 | HR 87 | Temp 98.0°F | Ht 61.0 in | Wt 132.0 lb

## 2021-05-19 DIAGNOSIS — R7303 Prediabetes: Secondary | ICD-10-CM

## 2021-05-19 DIAGNOSIS — E785 Hyperlipidemia, unspecified: Secondary | ICD-10-CM

## 2021-05-19 DIAGNOSIS — R1013 Epigastric pain: Secondary | ICD-10-CM | POA: Insufficient documentation

## 2021-05-19 DIAGNOSIS — I10 Essential (primary) hypertension: Secondary | ICD-10-CM

## 2021-05-19 LAB — COMPREHENSIVE METABOLIC PANEL
ALT: 16 U/L (ref 0–35)
AST: 20 U/L (ref 0–37)
Albumin: 4.5 g/dL (ref 3.5–5.2)
Alkaline Phosphatase: 76 U/L (ref 39–117)
BUN: 17 mg/dL (ref 6–23)
CO2: 29 mEq/L (ref 19–32)
Calcium: 10.4 mg/dL (ref 8.4–10.5)
Chloride: 103 mEq/L (ref 96–112)
Creatinine, Ser: 0.68 mg/dL (ref 0.40–1.20)
GFR: 98.89 mL/min (ref >60.00)
Glucose, Bld: 89 mg/dL (ref 70–99)
Potassium: 4.6 mEq/L (ref 3.5–5.1)
Sodium: 139 mEq/L (ref 135–145)
Total Bilirubin: 0.4 mg/dL (ref 0.2–1.2)
Total Protein: 7.7 g/dL (ref 6.0–8.3)

## 2021-05-19 LAB — LIPID PANEL
Cholesterol: 151 mg/dL (ref 0–200)
HDL: 37.2 mg/dL — ABNORMAL LOW (ref >39.00)
LDL Cholesterol: 79 mg/dL (ref 0–99)
NonHDL: 113.77
Total CHOL/HDL Ratio: 4
Triglycerides: 174 mg/dL — ABNORMAL HIGH (ref 0.0–149.0)
VLDL: 34.8 mg/dL (ref 0.0–40.0)

## 2021-05-19 LAB — LIPASE: Lipase: 32 U/L (ref 11.0–59.0)

## 2021-05-19 LAB — HEMOGLOBIN A1C: Hgb A1c MFr Bld: 5.6 % (ref 4.6–6.5)

## 2021-05-19 MED ORDER — ESOMEPRAZOLE MAGNESIUM 40 MG PO CPDR: 40.0000 mg | DELAYED_RELEASE_CAPSULE | Freq: Every day | ORAL | 3 refills | Status: DC

## 2021-05-19 NOTE — Patient Instructions (Signed)
Dolor abdominal en los adultos Abdominal Pain, Adult El dolor de estmago (abdominal) puede tener muchas causas. La mayora de las veces, el dolor de estmago no es peligroso. Muchos de estos casos de dolor de estmago pueden controlarse y tratarse en casa. Sin embargo, a veces, el dolor de estmago es grave. Elmdico intentar descubrir la causa del dolor de estmago. Siga estas instrucciones en su casa:  Medicamentos Tome los medicamentos de venta libre y los recetados solamente como se lo haya indicado el mdico. No tome medicamentos que lo ayuden a defecar (laxantes), salvo que el mdico se lo indique. Instrucciones generales Est atento al dolor de estmago para detectar cualquier cambio. Beba suficiente lquido para mantener el pis (la orina) de color amarillo plido. Concurra a todas las visitas de seguimiento como se lo haya indicado el mdico. Esto es importante. Comunquese con un mdico si: El dolor de estmago cambia o empeora. No tiene apetito o baja de peso sin proponrselo. Tiene dificultades para defecar (est estreido) o heces lquidas (diarrea) durante ms de 2 o 3 das. Siente dolor al orinar o defecar. El dolor de estmago lo despierta de noche. El dolor empeora con las comidas, despus de comer o con determinados alimentos. Tiene vmitos y no puede retener nada de lo que ingiere. Tiene fiebre. Observa sangre en la orina. Solicite ayuda de inmediato si: El dolor no desaparece en el tiempo indicado por el mdico. No puede dejar de vomitar. Siente dolor solamente en zonas especficas del abdomen, como el lado derecho o la parte inferior izquierda. Tiene heces con sangre, de color negro o con aspecto alquitranado. Tiene dolor muy intenso en el vientre, clicos o meteorismo. Presenta signos de no tener suficientes lquidos o agua en el cuerpo (deshidratacin), por ejemplo: Orina oscura, muy escasa o falta de orina. Labios agrietados. Sequedad de boca. Ojos  hundidos. Somnolencia. Debilidad. Tiene dificultad para respirar o dolor en el pecho. Resumen Muchos de estos casos de dolor de estmago pueden controlarse y tratarse en casa. Est atento al dolor de estmago para detectar cualquier cambio. Tome los medicamentos de venta libre y los recetados solamente como se lo haya indicado el mdico. Comunquese con un mdico si el dolor de estmago cambia o empeora. Busque ayuda de inmediato si tiene dolor muy intenso en el vientre, clicos o meteorismo. Esta informacin no tiene como fin reemplazar el consejo del mdico. Asegresede hacerle al mdico cualquier pregunta que tenga. Document Revised: 11/30/2018 Document Reviewed: 11/30/2018 Elsevier Patient Education  2022 Elsevier Inc.  

## 2021-05-19 NOTE — Progress Notes (Signed)
[name] [name] 55 y.o.   Chief Complaint  Patient presents with   Headache    X 1wk.   GI Problem   Medication Refill    HISTORY OF PRESENT ILLNESS: This is a 55 y.o. female was recently in [place] and had episode of epigastric pain. Was started on anti-Helicobacter pylori treatment but 6 days into therapy developed severe epigastric pain. Work-up showed increased triglycerides at 415.  Normal CBC.  Normal H. pylori blood and stool test. H. pylori treatment was stopped and started on esomeprazole 40.  Was advised to follow-up with GI for upper endoscopy. Doing better.  Able to eat and drink.  Not having pain.  No melena or rectal bleeding.  Occasional headaches for the past week. No other complaints or medical concerns today.  Headache  Associated symptoms include abdominal pain. Pertinent negatives include no coughing, fever, nausea, sore throat or vomiting.  GI Problem The primary symptoms include abdominal pain. Primary symptoms do not include fever, nausea, vomiting, melena, dysuria or rash.  The illness does not include chills.  Medication Refill Associated symptoms include abdominal pain and headaches. Pertinent negatives include no chest pain, chills, congestion, coughing, fever, nausea, rash, sore throat or vomiting.    Prior to Admission medications   Medication Sig Start Date End Date Taking? Authorizing Provider  amitriptyline (ELAVIL) 25 MG tablet Take 1 tablet (25 mg total) by mouth at bedtime. 11/14/20 05/19/21 Yes SagardiaInes Bloomer, MD  aspirin EC 81 MG tablet Take 1 tablet (81 mg total) by mouth daily. 12/08/19  Yes Jacelyn Pi, Lilia Argue, MD  atorvastatin (LIPITOR) 20 MG tablet TAKE ONE TABLET BY MOUTH AT BEDTINE 01/14/21  Yes Zian Mohamed, Ines Bloomer, MD  lisinopril-hydrochlorothiazide (ZESTORETIC) 20-12.5 MG tablet Take 1 tablet by mouth daily. 01/14/21  Yes Horald Pollen, MD  metFORMIN (GLUCOPHAGE) 500 MG tablet Take 1 tablet (500 mg total) by mouth 2 (two)  times daily with a meal. 09/18/20  Yes Eragon Hammond, Ines Bloomer, MD  Multiple Vitamin (MULTIVITAMIN ADULT PO) Take 1 tablet by mouth.   Yes [provider]  nitrofurantoin, macrocrystal-monohydrate, (MACROBID) 100 MG capsule Take 1 capsule (100 mg total) by mouth 2 (two) times daily. 12/08/19  Yes Jacelyn Pi, Lilia Argue, MD    No Known Allergies  Patient Active Problem List   Diagnosis Date Noted   Prediabetes 01/14/2021   Hyperlipidemia 12/29/2018   FHx: early MI 12/29/2018   OSA (obstructive sleep apnea) 12/29/2018   Depression 12/29/2018   Chronic migraine w/o aura w/o status migrainosus, not intractable 08/10/2017   Dysplasia of cervix, low grade (CIN 1) 10/30/2015   Essential hypertension, benign 07/25/2012   Varicose veins 07/07/2011    Past Medical History:  Diagnosis Date   Acid reflux    Acute UTI 05/19/2018   Chest pain    09/15/2017 - normal stress echo   Chronic migraine w/o aura w/o status migrainosus, not intractable 08/10/2017   Dysplasia of cervix, low grade (CIN 1) 10/30/2015   09/2015- LSIL 10/2015- CIN 1 09/2016- NIL- no cotest done 04/2018- NIL, +HPV 06/2019- LSIL, HPV POS 07/2019- CIN 1   Endometriosis    Essential hypertension, benign 07/25/2012   Gastritis    Hypertension    Migraine    OSA (obstructive sleep apnea) 12/29/2018   Plantar fasciitis of right foot 10/05/2013   Varicose veins     Past Surgical History:  Procedure Laterality Date   CESAREAN SECTION     She had 5 C-Sections  Social History   Socioeconomic History   Marital status: Married    Spouse name: Not on file   Number of children: 5   Years of education: Not on file   Highest education level: Bachelor's degree (e.g., BA, AB, BS)  Occupational History   Not on file  Tobacco Use   Smoking status: Never   Smokeless tobacco: Never  Vaping Use   Vaping Use: Never used  Substance and Sexual Activity   Alcohol use: No    Alcohol/week: 0.0 standard drinks   Drug use: No   Sexual  activity: Yes    Birth control/protection: Post-menopausal  Other Topics Concern   Not on file  Social History Narrative   Lives at home with her husband and children   Drinks 1 cup of decaf coffee daily, sometimes drinks soda, mostly water   Right handed   Social Determinants of Health   Financial Resource Strain: Not on file  Food Insecurity: Not on file  Transportation Needs: Not on file  Physical Activity: Not on file  Stress: Not on file  Social Connections: Not on file  Intimate Partner Violence: Not on file    Family History  Problem Relation Age of Onset   Stroke Father    Hypertension Father    Diabetes Father    Heart attack Father    Hypertension Mother    Thyroid disease Mother      Review of Systems  Constitutional: Negative.  Negative for chills and fever.  HENT: Negative.  Negative for congestion and sore throat.   Respiratory: Negative.  Negative for cough and shortness of breath.   Cardiovascular: Negative.  Negative for chest pain and palpitations.  Gastrointestinal:  Positive for abdominal pain. Negative for blood in stool, melena, nausea and vomiting.  Genitourinary: Negative.  Negative for dysuria and hematuria.  Skin: Negative.  Negative for rash.  Neurological:  Positive for headaches.  All other systems reviewed and are negative.   Physical Exam Vitals reviewed.  Constitutional:      Appearance: She is well-developed.  HENT:     Head: Normocephalic.  Eyes:     Extraocular Movements: Extraocular movements intact.     Conjunctiva/sclera: Conjunctivae normal.     Pupils: Pupils are equal, round, and reactive to light.  Cardiovascular:     Rate and Rhythm: Normal rate and regular rhythm.     Pulses: Normal pulses.     Heart sounds: Normal heart sounds.  Pulmonary:     Effort: Pulmonary effort is normal.     Breath sounds: Normal breath sounds.  Musculoskeletal:        General: Normal range of motion.     Cervical back: Normal range of  motion and neck supple.  Skin:    General: Skin is warm and dry.     Capillary Refill: Capillary refill takes less than 2 seconds.  Neurological:     General: No focal deficit present.     Mental Status: She is alert and oriented to person, place, and time.  Psychiatric:        Mood and Affect: Mood normal.        Behavior: Behavior normal.     ASSESSMENT & PLAN: Problem List Items Addressed This Visit       Cardiovascular and Mediastinum   Essential hypertension, benign    Well-controlled hypertension. BP Readings from Last 3 Encounters:  05/19/21 118/80  01/14/21 128/70  10/03/20 119/80  Continue Zestoretic 20-12.5 mg daily.  Other   Hyperlipidemia    Triglycerides very elevated in most recent test done in [place]. Lipid profile repeated today. Continue atorvastatin 20 mg daily. Diet and nutrition discussed. The 10-year ASCVD risk score (Arnett DK, et al., 2019) is: 2.4%   Values used to calculate the score:     Age: 5 years     Sex: Female     Is Non-Hispanic African American: No     Diabetic: No     Tobacco smoker: No     Systolic Blood Pressure: 962 mmHg     Is BP treated: Yes     HDL Cholesterol: 41.8 mg/dL     Total Cholesterol: 178 mg/dL       Prediabetes    Diet and nutrition discussed.  Hemoglobin A1c repeated today. Advised to decrease amount of daily carbohydrate intake. Continue metformin 500 mg twice a day.      Relevant Orders   Hemoglobin A1c   Epigastric pain - Primary    Differential diagnosis discussed with patient.  Possibility of PUD versus gastritis. Clinically stable.  Tested negative for H. pylori in blood in stools. Continue esomeprazole 40 mg daily.  Needs GI evaluation and possible upper endoscopy.  Referral placed today.  Diet and nutrition discussed. Blood work done today including lipase.  Advised to avoid NSAIDs and stay well-hydrated.      Relevant Medications   esomeprazole (NEXIUM) 40 MG capsule   Other  Relevant Orders   Lipase   Comprehensive metabolic panel   Lipid panel   Ambulatory referral to Gastroenterology   Patient Instructions  Dolor abdominal en los adultos Abdominal Pain, Adult El dolor de estmago (abdominal) puede tener muchas causas. [place] veces, el dolor de [place] no es peligroso. Muchos de [employer] de dolor de estmago pueden controlarse y tratarse en casa. Sin embargo, a Clinical cytogeneticist, Conservation officer, historic buildings de [place] es grave. El mdico intentar descubrir la causa del dolor de [place]. Siga estas instrucciones en su casa: Medicamentos [employer] de venta libre y los recetados solamente como se lo haya indicado el mdico. No tome medicamentos que lo ayuden a Landscape architect (laxantes), salvo que el mdico se lo indique. Instrucciones generales Est atento al dolor de estmago para Actuary cambio. Beba suficiente lquido para Contractor pis (la orina) de color amarillo plido. Concurra a todas las visitas de seguimiento como se lo haya indicado el mdico. Esto es importante. Comunquese con un mdico si: El dolor de estmago cambia o [place]. No tiene apetito o baja de peso sin proponrselo. Tiene dificultades para defecar (est estreido) o heces lquidas (diarrea) durante ms de 2 o 3 das. Siente dolor al orinar o defecar. El dolor de estmago lo despierta de noche. El dolor empeora con las comidas, despus de comer o con determinados alimentos. Tiene vmitos y no puede retener nada de lo que ingiere. Tiene fiebre. Observa sangre en la orina. Solicite ayuda de inmediato si: El dolor no desaparece en el tiempo indicado por el mdico. No puede dejar de vomitar. Siente dolor solamente en zonas especficas del abdomen, como el lado derecho o la parte inferior izquierda. Tiene heces con sangre, de color negro o con aspecto alquitranado. Tiene dolor muy intenso en el vientre, clicos o meteorismo. Presenta signos de no tener suficientes lquidos o agua  en el cuerpo (deshidratacin), por ejemplo: [place] Else, muy escasa o falta de orina. Labios agrietados. Sequedad de boca. Ojos hundidos. Somnolencia. Debilidad. Tiene dificultad para respirar o dolor  en el pecho. Resumen Muchos de [employer] de dolor de estmago pueden controlarse y tratarse en casa. Est atento al dolor de estmago para Actuary cambio. Tome los medicamentos de venta libre y los recetados solamente como se lo haya indicado el mdico. Comunquese con un mdico si el dolor de estmago cambia o [place]. [place] ayuda de inmediato si tiene dolor muy intenso en el vientre, clicos o meteorismo. Esta informacin no tiene Marine scientist el consejo del mdico. Asegrese de hacerle al mdico cualquier pregunta que tenga. Document Revised: 11/30/2018 Document Reviewed: 11/30/2018 Elsevier Patient Education  2022 [place], MD [facility]

## 2021-05-19 NOTE — Assessment & Plan Note (Signed)
Triglycerides very elevated in most recent test done in [place]. Lipid profile repeated today. Continue atorvastatin 20 mg daily. Diet and nutrition discussed. The 10-year ASCVD risk score (Arnett DK, et al., 2019) is: 2.4%   Values used to calculate the score:     Age: 55 years     Sex: Female     Is Non-Hispanic African American: No     Diabetic: No     Tobacco smoker: No     Systolic Blood Pressure: 010 mmHg     Is BP treated: Yes     HDL Cholesterol: 41.8 mg/dL     Total Cholesterol: 178 mg/dL

## 2021-05-19 NOTE — Assessment & Plan Note (Signed)
Differential diagnosis discussed with patient.  Possibility of PUD versus gastritis. Clinically stable.  Tested negative for H. pylori in blood in stools. Continue esomeprazole 40 mg daily.  Needs GI evaluation and possible upper endoscopy.  Referral placed today.  Diet and nutrition discussed. Blood work done today including lipase.  Advised to avoid NSAIDs and stay well-hydrated.

## 2021-05-19 NOTE — Assessment & Plan Note (Addendum)
Diet and nutrition discussed.  Hemoglobin A1c repeated today. Advised to decrease amount of daily carbohydrate intake. Continue metformin 500 mg twice a day.

## 2021-05-19 NOTE — Assessment & Plan Note (Signed)
Well-controlled hypertension. BP Readings from Last 3 Encounters:  05/19/21 118/80  01/14/21 128/70  10/03/20 119/80  Continue Zestoretic 20-12.5 mg daily.

## 2021-05-20 ENCOUNTER — Telehealth: Payer: Self-pay | Admitting: Emergency Medicine

## 2021-05-20 ENCOUNTER — Other Ambulatory Visit: Payer: Self-pay | Admitting: Emergency Medicine

## 2021-05-20 DIAGNOSIS — E785 Hyperlipidemia, unspecified: Secondary | ICD-10-CM

## 2021-05-20 MED ORDER — ATORVASTATIN CALCIUM 20 MG PO TABS: ORAL_TABLET | ORAL | 3 refills | Status: DC

## 2021-05-20 NOTE — Telephone Encounter (Signed)
Blood results discussed with patient. Elevated triglycerides. New prescription for atorvastatin sent to pharmacy of record today.

## 2021-06-05 ENCOUNTER — Encounter: Payer: Self-pay | Admitting: Gastroenterology

## 2021-06-16 ENCOUNTER — Ambulatory Visit: Payer: Self-pay | Admitting: Emergency Medicine

## 2021-06-20 ENCOUNTER — Encounter: Payer: Self-pay | Admitting: Gastroenterology

## 2021-06-20 ENCOUNTER — Ambulatory Visit: Payer: Self-pay | Admitting: Gastroenterology

## 2021-06-20 VITALS — BP 110/70 | HR 60 | Ht 62.0 in | Wt 132.0 lb

## 2021-06-20 DIAGNOSIS — R1013 Epigastric pain: Secondary | ICD-10-CM

## 2021-06-20 DIAGNOSIS — R110 Nausea: Secondary | ICD-10-CM

## 2021-06-20 DIAGNOSIS — K219 Gastro-esophageal reflux disease without esophagitis: Secondary | ICD-10-CM

## 2021-06-20 DIAGNOSIS — K921 Melena: Secondary | ICD-10-CM

## 2021-06-20 MED ORDER — ONDANSETRON HCL 4 MG PO TABS: 4.0000 mg | ORAL_TABLET | Freq: Every day | ORAL | 0 refills | Status: DC | PRN

## 2021-06-20 MED ORDER — ESOMEPRAZOLE MAGNESIUM 40 MG PO CPDR: 40.0000 mg | DELAYED_RELEASE_CAPSULE | Freq: Every day | ORAL | 3 refills | Status: DC

## 2021-06-20 NOTE — Patient Instructions (Signed)
You have been scheduled for an endoscopy and colonoscopy. Please follow the written instructions given to you at your visit today. Please pick up your prep supplies at the pharmacy within the next 1-3 days. If you use inhalers (even only as needed), please bring them with you on the day of your procedure.   We have sent Zofran and Nexium to your pharmacy  Follow up after Colonscopy/Endoscopy   AVOID NSAIDS   Due to recent changes in healthcare laws, you may see the results of your imaging and laboratory studies on MyChart before your provider has had a chance to review them.  We understand that in some cases there may be results that are confusing or concerning to you. Not all laboratory results come back in the same time frame and the provider may be waiting for multiple results in order to interpret others.  Please give [place] 48 hours in order for your provider to thoroughly review all the results before contacting the office for clarification of your results.    If you are age 9 or older, your body mass index should be between 23-30. Your Body mass index is 24.14 kg/m. If this is out of the aforementioned range listed, please consider follow up with your Primary Care Provider.  If you are age 57 or younger, your body mass index should be between 19-25. Your Body mass index is 24.14 kg/m. If this is out of the aformentioned range listed, please consider follow up with your Primary Care Provider.   ________________________________________________________  The [facility] providers would like to encourage you to use [facility] to communicate with providers for non-urgent requests or questions.  Due to long hold times on the telephone, sending your provider a message by [facility] may be a faster and more efficient way to get a response.  Please allow 48 business hours for a response.  Please remember that this is for non-urgent requests.  _______________________________________________________   I  appreciate the  opportunity to care for you  Thank You   Harl Bowie , MD

## 2021-06-20 NOTE — Progress Notes (Signed)
[name] [name]    [id number]    06-16-1965  Primary Care Physician:Sagardia, Ines Bloomer, MD  Referring Physician: Horald Pollen, [place],  [place] [address]   Chief complaint:  Epigastric abd pain  HPI: 56 year old very pleasant Hispanic Spanish-speaking female here for new patient visit with complaints of severe epigastric abdominal pain.  She is accompanied by language interpreter She states her symptoms started in the past few months, for really bad around November and December of last year.  Symptoms are somewhat better in the past few weeks.  She was having severe epigastric abdominal pain after every meal and sometimes even when she tries to drink water.  She had stool test in [place] around November/December 2022 and was told that she had blood in her stool.  She did not tolerate antibiotics for H. pylori treatment  She has history of chronic constipation, uses fiber supplements and over-the-counter laxatives as needed and has a bowel movement 2-3 times a week  She has constant nausea and epigastric discomfort but denies any vomiting. No melena or rectal bleeding.  Never had colonoscopy  EGD in 2005 by Dr.Kaplan : Normal   Outpatient Encounter Medications as of 06/20/2021  Medication Sig   aspirin EC 81 MG tablet Take 1 tablet (81 mg total) by mouth daily.   atorvastatin (LIPITOR) 20 MG tablet TAKE ONE TABLET BY MOUTH AT BEDTINE   esomeprazole (NEXIUM) 40 MG capsule Take 1 capsule (40 mg total) by mouth daily.   lisinopril-hydrochlorothiazide (ZESTORETIC) 20-25 MG tablet Take 1 tablet by mouth daily.   metFORMIN (GLUCOPHAGE) 500 MG tablet Take 1 tablet (500 mg total) by mouth 2 (two) times daily with a meal.   Multiple Vitamin (MULTIVITAMIN ADULT PO) Take 1 tablet by mouth.   amitriptyline (ELAVIL) 25 MG tablet Take 1 tablet (25 mg total) by mouth at bedtime.   [DISCONTINUED] lisinopril-hydrochlorothiazide (ZESTORETIC)  20-12.5 MG tablet Take 1 tablet by mouth daily.   [DISCONTINUED] nitrofurantoin, macrocrystal-monohydrate, (MACROBID) 100 MG capsule Take 1 capsule (100 mg total) by mouth 2 (two) times daily.   No facility-administered encounter medications on file as of 06/20/2021.    Allergies as of 06/20/2021   (No Known Allergies)    Past Medical History:  Diagnosis Date   Acid reflux    Acute UTI 05/19/2018   Chest pain    09/15/2017 - normal stress echo   Chronic migraine w/o aura w/o status migrainosus, not intractable 08/10/2017   Dysplasia of cervix, low grade (CIN 1) 10/30/2015   09/2015- LSIL 10/2015- CIN 1 09/2016- NIL- no cotest done 04/2018- NIL, +HPV 06/2019- LSIL, HPV POS 07/2019- CIN 1   Endometriosis    Essential hypertension, benign 07/25/2012   Gastritis    Hypertension    Migraine    OSA (obstructive sleep apnea) 12/29/2018   Plantar fasciitis of right foot 10/05/2013   Varicose veins     Past Surgical History:  Procedure Laterality Date   CESAREAN SECTION     She had 5 C-Sections    Family History  Problem Relation Age of Onset   Hypertension Mother    Thyroid disease Mother    Stroke Father    Hypertension Father    Diabetes Father    Heart attack Father     Social History   Socioeconomic History   Marital status: Married    Spouse name: Not on file   Number of children: 5  Years of education: Not on file   Highest education level: Bachelor's degree (e.g., BA, AB, BS)  Occupational History   Occupation: Scientist, water quality  Tobacco Use   Smoking status: Never   Smokeless tobacco: Never  Vaping Use   Vaping Use: Never used  Substance and Sexual Activity   Alcohol use: No    Alcohol/week: 0.0 standard drinks   Drug use: No   Sexual activity: Yes    Birth control/protection: Post-menopausal  Other Topics Concern   Not on file  Social History Narrative   Lives at home with her husband and children   Drinks 1 cup of decaf coffee daily, sometimes drinks soda, mostly  water   Right handed   Social Determinants of Health   Financial Resource Strain: Not on file  Food Insecurity: Not on file  Transportation Needs: Not on file  Physical Activity: Not on file  Stress: Not on file  Social Connections: Not on file  Intimate Partner Violence: Not on file      Review of systems: All other review of systems negative except as mentioned in the HPI.   Physical Exam: Vitals:   06/20/21 1115  BP: 110/70  Pulse: 60   Body mass index is 24.14 kg/m. Gen:      No acute distress HEENT:  sclera anicteric Abd:      soft, non-tender; no palpable masses, no distension Ext:    No edema Neuro: alert and oriented x 3 Psych: normal mood and affect  Data Reviewed:  Reviewed labs, radiology imaging, old records and pertinent past GI work up   Assessment and Plan/Recommendations:  56 year old very pleasant Spanish-speaking female with complaints of epigastric abdominal pain and history of Hemoccult positive stool She is past due for colorectal cancer screening Schedule for EGD and colonoscopy for further evaluation, will need to exclude gastroduodenal ulcer, severe erosive gastritis or any neoplastic lesion  Obtain right upper quadrant ultrasound to exclude gallbladder disease or chronic cholecystitis associated with cholelithiasis  GERD: Use Nexium 40 mg daily and antireflux measures  Nausea: Use Zofran 4 mg daily as needed for severe symptoms Small frequent meals Avoid high carb and high fat diet  Avoid NSAIDs  Return in 2 months after EGD and colonoscopy  This visit required >60 minutes of patient care (this includes precharting, chart review, review of results, face-to-face time used for counseling as well as treatment plan and follow-up. The patient was provided an opportunity to ask questions and all were answered. The patient agreed with the plan and demonstrated an understanding of the instructions.  [name] [name] , MD    CC:  [place], [place]

## 2021-07-03 ENCOUNTER — Other Ambulatory Visit: Payer: Self-pay

## 2021-07-03 DIAGNOSIS — Z1231 Encounter for screening mammogram for malignant neoplasm of breast: Secondary | ICD-10-CM

## 2021-07-16 ENCOUNTER — Encounter: Payer: Self-pay | Admitting: Gastroenterology

## 2021-07-16 ENCOUNTER — Ambulatory Visit (AMBULATORY_SURGERY_CENTER): Payer: Self-pay | Admitting: Gastroenterology

## 2021-07-16 VITALS — BP 124/77 | HR 66 | Temp 98.3°F | Resp 18 | Ht 62.0 in | Wt 132.0 lb

## 2021-07-16 DIAGNOSIS — K648 Other hemorrhoids: Secondary | ICD-10-CM

## 2021-07-16 DIAGNOSIS — K2970 Gastritis, unspecified, without bleeding: Secondary | ICD-10-CM

## 2021-07-16 DIAGNOSIS — K921 Melena: Secondary | ICD-10-CM

## 2021-07-16 DIAGNOSIS — R1013 Epigastric pain: Secondary | ICD-10-CM

## 2021-07-16 DIAGNOSIS — R195 Other fecal abnormalities: Secondary | ICD-10-CM

## 2021-07-16 MED ORDER — SODIUM CHLORIDE 0.9 % IV SOLN: 500.0000 mL | Freq: Once | INTRAVENOUS | Status: DC

## 2021-07-16 NOTE — Progress Notes (Signed)
A and O x3. Report to RN. Tolerated MAC anesthesia well. Teeth unchanged after procedure.

## 2021-07-16 NOTE — Op Note (Signed)
[facility] Patient Name: [name] [name] Procedure Date: 07/16/2021 2:46 PM MRN: [id number] Endoscopist: Mauri Pole , MD Age: 56 Referring MD:  Date of Birth: Jan 19, 1966 Gender: Female Account #: [id number] Procedure:                Colonoscopy Indications:              Evaluation of unexplained GI bleeding presenting                            with fecal occult blood Medicines:                Monitored Anesthesia Care Procedure:                Pre-Anesthesia Assessment:                           - Prior to the procedure, a History and Physical                            was performed, and patient medications and                            allergies were reviewed. The patient's tolerance of                            previous anesthesia was also reviewed. The risks                            and benefits of the procedure and the sedation                            options and risks were discussed with the patient.                            All questions were answered, and informed consent                            was obtained. Prior Anticoagulants: The patient has                            taken no previous anticoagulant or antiplatelet                            agents. ASA Grade Assessment: II - A patient with                            mild systemic disease. After reviewing the risks                            and benefits, the patient was deemed in                            satisfactory condition to undergo the procedure.  After obtaining informed consent, the colonoscope                            was passed under direct vision. Throughout the                            procedure, the patient's blood pressure, pulse, and                            oxygen saturations were monitored continuously. The                            PCF-HQ190L Colonoscope was introduced through the                            anus and advanced to the the  cecum, identified by                            appendiceal orifice and ileocecal valve. The                            colonoscopy was performed without difficulty. The                            patient tolerated the procedure well. The quality                            of the bowel preparation was not adequate to                            identify polyps 6 mm and larger in size. The                            terminal ileum, ileocecal valve, appendiceal                            orifice, and rectum were photographed. Scope In: 3:14:09 PM Scope Out: 3:25:22 PM Scope Withdrawal Time: 0 hours 6 minutes 27 seconds  Total Procedure Duration: 0 hours 11 minutes 13 seconds  Findings:                 The perianal and digital rectal examinations were                            normal.                           Non-bleeding external and internal hemorrhoids were                            found during retroflexion. The hemorrhoids were                            medium-sized.  A large amount of semi-liquid semi-solid stool was                            found in the entire colon, interfering with                            visualization. Lavage of the area was performed,                            resulting in incomplete clearance with fair                            visualization. Complications:            No immediate complications. Estimated Blood Loss:     Estimated blood loss: none. Impression:               - Preparation of the colon was inadequate.                           - Non-bleeding external and internal hemorrhoids.                           - Stool in the entire examined colon.                           - No specimens collected. Recommendation:           - Patient has a contact number available for                            emergencies. The signs and symptoms of potential                            delayed complications were discussed with the                             patient. Return to normal activities tomorrow.                            Written discharge instructions were provided to the                            patient.                           - Resume previous diet.                           - Continue present medications.                           - Repeat colonoscopy at the next available                            appointment for screening purposes. Mauri Pole, MD 07/16/2021 3:34:19 PM This report has been signed electronically.

## 2021-07-16 NOTE — Patient Instructions (Addendum)
Please read handouts provided. Continue present medications. Use Nexium ( esomeprazole ) 40 mg daily. Await pathology results. Repeat colonoscopy at next available appointment.  YOU HAD AN ENDOSCOPIC PROCEDURE TODAY AT [facility]:   Refer to the procedure report that was given to you for any specific questions about what was found during the examination.  If the procedure report does not answer your questions, please call your gastroenterologist to clarify.  If you requested that your care partner not be given the details of your procedure findings, then the procedure report has been included in a sealed envelope for you to review at your convenience later.  YOU SHOULD EXPECT: Some feelings of bloating in the abdomen. Passage of more gas than usual.  Walking can help get rid of the air that was put into your GI tract during the procedure and reduce the bloating. If you had a lower endoscopy (such as a colonoscopy or flexible sigmoidoscopy) you may notice spotting of blood in your stool or on the toilet paper. If you underwent a bowel prep for your procedure, you may not have a normal bowel movement for a few days.  Please Note:  You might notice some irritation and congestion in your nose or some drainage.  This is from the oxygen used during your procedure.  There is no need for concern and it should clear up in a day or so.  SYMPTOMS TO REPORT IMMEDIATELY:  Following lower endoscopy (colonoscopy or flexible sigmoidoscopy):  Excessive amounts of blood in the stool  Significant tenderness or worsening of abdominal pains  Swelling of the abdomen that is new, acute  Fever of 100F or higher  Following upper endoscopy (EGD)  Vomiting of blood or coffee ground material  New chest pain or pain under the shoulder blades  Painful or persistently difficult swallowing  New shortness of breath  Fever of 100F or higher  Black, tarry-looking stools  For urgent or emergent issues, a  gastroenterologist can be reached at any hour by calling [phone]. Do not use MyChart messaging for urgent concerns.    DIET:  We do recommend a small meal at first, but then you may proceed to your regular diet.  Drink plenty of fluids but you should avoid alcoholic beverages for 24 hours.  ACTIVITY:  You should plan to take it easy for the rest of today and you should NOT DRIVE or use heavy machinery until tomorrow (because of the sedation medicines used during the test).    FOLLOW UP: Our staff will call the number listed on your records 48-72 hours following your procedure to check on you and address any questions or concerns that you may have regarding the information given to you following your procedure. If we do not reach you, we will leave a message.  We will attempt to reach you two times.  During this call, we will ask if you have developed any symptoms of COVID 19. If you develop any symptoms (ie: fever, flu-like symptoms, shortness of breath, cough etc.) before then, please call [phone].  If you test positive for Covid 19 in the 2 weeks post procedure, please call and report this information to [place].    If any biopsies were taken you will be contacted by phone or by letter within the next 1-3 weeks.  Please call us at [phone] if you have not heard about the biopsies in 3 weeks.    SIGNATURES/CONFIDENTIALITY: You and/or your care partner have signed  paperwork which will be entered into your electronic medical record.  These signatures attest to the fact that that the information above on your After Visit Summary has been reviewed and is understood.  Full responsibility of the confidentiality of this discharge information lies with you and/or your care-partner. USTED TUVO UN PROCEDIMIENTO ENDOSCPICO HOY EN EL [facility]:   Lea el informe del procedimiento que se le entreg para cualquier pregunta especfica sobre lo que se Primary school teacher.  Si el  informe del examen no responde a sus preguntas, por favor llame a su gastroenterlogo para aclararlo.  Si usted solicit que no se le den [employer] de lo que se Estate manager/land agent en su procedimiento al [employer] va a cuidar, entonces el informe del procedimiento se ha incluido en un sobre sellado para que usted lo revise despus cuando le sea ms conveniente.   LO QUE PUEDE ESPERAR: Algunas sensaciones de hinchazn en el abdomen.  Puede tener ms gases de lo normal.  El caminar puede ayudarle a eliminar el aire que se le puso en el tracto gastrointestinal durante el procedimiento y reducir la hinchazn.  Si le hicieron una endoscopia inferior (como una colonoscopia o una sigmoidoscopia flexible), podra notar manchas de sangre en las heces fecales o en el papel higinico.  Si se someti a una preparacin intestinal para su procedimiento, es posible que no tenga una evacuacin intestinal normal durante [employer].   Tenga en cuenta:  Es posible que note un poco de irritacin y congestin en la nariz o algn drenaje.  Esto es debido al oxgeno [employer] durante su procedimiento.  No hay que preocuparse y esto debe desaparecer ms o Scientist, research (medical).   SNTOMAS PARA REPORTAR INMEDIATAMENTE:  Despus de una endoscopia inferior (colonoscopia o sigmoidoscopia flexible):  Cantidades excesivas de sangre en las heces fecales  Sensibilidad significativa o empeoramiento de los dolores abdominales   Hinchazn aguda del abdomen que antes no tena   Fiebre de 100F o ms   Despus de la endoscopia superior (EGD)  Vmitos de Biochemist, clinical o material como caf molido   Dolor en el pecho o dolor debajo de los omplatos que antes no tena   Dolor o dificultad persistente para tragar  Falta de aire que antes no tena   Fiebre de 100F o ms  Heces fecales negras y pegajosas   Para asuntos urgentes o de Freight forwarder, puede comunicarse con un gastroenterlogo a cualquier hora llamando al [phone].  DIETA:   Recomendamos una comida pequea al principio, pero luego puede continuar con su dieta normal.  Tome muchos lquidos, Teacher, adult education las bebidas alcohlicas durante 24 horas.    ACTIVIDAD:  Debe planear tomarse las cosas con calma por el resto del da y no debe CONDUCIR ni usar maquinaria pesada Programmer, applications (debido a los medicamentos de sedacin utilizados durante el examen).     SEGUIMIENTO: Nuestro personal llamar al nmero que aparece en su historial al siguiente da hbil de su procedimiento para ver cmo se siente y para responder cualquier pregunta o inquietud que pueda tener con respecto a la informacin que se le dio despus del procedimiento. Si no podemos contactarle, le dejaremos un mensaje.  Sin embargo, si se siente bien y no tiene Paediatric nurse, no es necesario que nos devuelva la llamada.  Asumiremos que ha regresado a sus actividades diarias normales sin incidentes. Si se le tomaron algunas biopsias, le contactaremos por telfono o por carta en las prximas  3 semanas.  Si no ha sabido [employer] biopsias en el transcurso de 3 semanas, por favor llmenos al [phone].   FIRMAS/CONFIDENCIALIDAD: Usted y/o el acompaante que le cuide han firmado documentos que se ingresarn en su historial mdico electrnico.  Estas firmas atestiguan el hecho de que la informacin anterior

## 2021-07-16 NOTE — Progress Notes (Signed)
Please refer to office visit note 06/20/21. No additional changes in H&P Patient is appropriate for planned procedure(s) and anesthesia in an ambulatory setting  K. Denzil Magnuson , MD [phone]

## 2021-07-16 NOTE — Progress Notes (Signed)
Called to room to assist during endoscopic procedure.  Patient ID and intended procedure confirmed with present staff. Received instructions for my participation in the procedure from the performing physician.  

## 2021-07-16 NOTE — Progress Notes (Signed)
Pt's states no medical or surgical changes since previsit or office visit. 

## 2021-07-16 NOTE — Op Note (Signed)
[facility] Patient Name: [name] [name] Procedure Date: 07/16/2021 2:50 PM MRN: [id number] Endoscopist: Mauri Pole , MD Age: 56 Referring MD:  Date of Birth: 1965-07-27 Gender: Female Account #: [id number] Procedure:                Upper GI endoscopy Indications:              Heme positive stool Medicines:                Monitored Anesthesia Care Procedure:                Pre-Anesthesia Assessment:                           - Prior to the procedure, a History and Physical                            was performed, and patient medications and                            allergies were reviewed. The patient's tolerance of                            previous anesthesia was also reviewed. The risks                            and benefits of the procedure and the sedation                            options and risks were discussed with the patient.                            All questions were answered, and informed consent                            was obtained. Prior Anticoagulants: The patient has                            taken no previous anticoagulant or antiplatelet                            agents. ASA Grade Assessment: II - A patient with                            mild systemic disease. After reviewing the risks                            and benefits, the patient was deemed in                            satisfactory condition to undergo the procedure.                           After obtaining informed consent, the endoscope was  passed under direct vision. Throughout the                            procedure, the patient's blood pressure, pulse, and                            oxygen saturations were monitored continuously. The                            GIF [id number] #[id number] was introduced through the                            mouth, and advanced to the second part of duodenum.                            The upper GI endoscopy was  accomplished without                            difficulty. The patient tolerated the procedure                            well. Scope In: Scope Out: Findings:                 The Z-line was regular and was found 36 cm from the                            incisors.                           No gross lesions were noted in the entire esophagus.                           Patchy mild inflammation characterized by                            congestion (edema) and erythema was found in the                            entire examined stomach. Biopsies were taken with a                            cold forceps for Helicobacter pylori testing.                           The cardia and gastric fundus were normal on                            retroflexion.                           The examined duodenum was normal. Complications:            No immediate complications. Estimated Blood Loss:     Estimated blood loss was minimal. Impression:               -  Z-line regular, 36 cm from the incisors.                           - No gross lesions in esophagus.                           - Gastritis. Biopsied.                           - Normal examined duodenum. Recommendation:           - Patient has a contact number available for                            emergencies. The signs and symptoms of potential                            delayed complications were discussed with the                            patient. Return to normal activities tomorrow.                            Written discharge instructions were provided to the                            patient.                           - Resume previous diet.                           - Continue present medications.                           - Await pathology results.                           - Use Nexium (esomeprazole) 40 mg PO daily. Mauri Pole, MD 07/16/2021 3:36:34 PM This report has been signed electronically.

## 2021-07-17 ENCOUNTER — Ambulatory Visit: Payer: Self-pay | Admitting: Emergency Medicine

## 2021-07-18 ENCOUNTER — Telehealth: Payer: Self-pay

## 2021-07-18 NOTE — Telephone Encounter (Signed)
°  Follow up Call-  Call back number 07/16/2021  Post procedure Call Back phone  # [phone]  Permission to leave phone message Yes  Some recent data might be hidden     Patient questions:  Do you have a fever, pain , or abdominal swelling? No. Pain Score  0 *  Have you tolerated food without any problems? Yes.    Have you been able to return to your normal activities? Yes.    Do you have any questions about your discharge instructions: Diet   No. Medications  No. Follow up visit  No.  Do you have questions or concerns about your Care? No.  Actions: * If pain score is 4 or above: No action needed, pain <4.   Have you developed a fever since your procedure? no  2.   Have you had an respiratory symptoms (SOB or cough) since your procedure? no  3.   Have you tested positive for COVID 19 since your procedure no  4.   Have you had any family members/close contacts diagnosed with the COVID 19 since your procedure?  no   If yes to any of these questions please route to Joylene John, RN and Joella Prince, RN

## 2021-07-24 ENCOUNTER — Encounter: Payer: Self-pay | Admitting: Gastroenterology

## 2021-07-31 ENCOUNTER — Ambulatory Visit: Payer: Self-pay | Admitting: Emergency Medicine

## 2021-08-06 ENCOUNTER — Ambulatory Visit: Payer: Self-pay | Admitting: Gastroenterology

## 2021-08-08 ENCOUNTER — Encounter: Payer: Self-pay | Admitting: Gastroenterology

## 2021-08-08 ENCOUNTER — Ambulatory Visit: Payer: Self-pay | Admitting: Gastroenterology

## 2021-08-08 VITALS — BP 128/80 | HR 84 | Ht 62.0 in | Wt 133.2 lb

## 2021-08-08 DIAGNOSIS — R1013 Epigastric pain: Secondary | ICD-10-CM

## 2021-08-08 DIAGNOSIS — Z1211 Encounter for screening for malignant neoplasm of colon: Secondary | ICD-10-CM

## 2021-08-08 DIAGNOSIS — R5383 Other fatigue: Secondary | ICD-10-CM

## 2021-08-08 DIAGNOSIS — R079 Chest pain, unspecified: Secondary | ICD-10-CM

## 2021-08-08 NOTE — Patient Instructions (Signed)
You have been scheduled for an abdominal ultrasound at [facility] (1st floor of hospital) on Friday 08/1021 at 10:00 am. Please arrive 15 minutes prior to your appointment for registration. Make certain not to have anything to eat or drink 6 hours prior to your appointment. Should you need to reschedule your appointment, please contact radiology at [phone]. This test typically takes about 30 minutes to perform. ? ?You have been scheduled for a colonoscopy. Please follow written instructions given to you at your visit today.  ?Please pick up your prep supplies at the pharmacy within the next 1-3 days. ?If you use inhalers (even only as needed), please bring them with you on the day of your procedure. ? ?Continue Nexium. ? ?Please follow up with Dr Silverio Decamp in the office 4 months after colonoscopy. ? ?If you are age 51 or older, your body mass index should be between 23-30. Your Body mass index is 24.36 kg/m?Marland Kitchen If this is out of the aforementioned range listed, please consider follow up with your Primary Care Provider. ? ?If you are age 57 or younger, your body mass index should be between 19-25. Your Body mass index is 24.36 kg/m?Marland Kitchen If this is out of the aformentioned range listed, please consider follow up with your Primary Care Provider.  ? ?________________________________________________________ ? ?The [facility] providers would like to encourage you to use [facility] to communicate with providers for non-urgent requests or questions.  Due to long hold times on the telephone, sending your provider a message by [facility] may be a faster and more efficient way to get a response.  Please allow 48 business hours for a response.  Please remember that this is for non-urgent requests.  ?_______________________________________________________ ?Due to recent changes in healthcare laws, you may see the results of your imaging and laboratory studies on MyChart before your provider has had a chance to review them.   We understand that in some cases there may be results that are confusing or concerning to you. Not all laboratory results come back in the same time frame and the provider may be waiting for multiple results in order to interpret others.  Please give [place] 48 hours in order for your provider to thoroughly review all the results before contacting the office for clarification of your results.  ? ?

## 2021-08-08 NOTE — Progress Notes (Signed)
? ?       ? [name] [name]    [id number]    Jul 19, 1965 ? ?Primary Care Physician:Sagardia, Ines Bloomer, MD ? ?Referring Physician: Horald Pollen, MD ?[address] ?[place],  [place] [address] ? ? ?Chief complaint: Epigastric abdominal pain ? ?HPI: ? ?56 year old very pleasant Hispanic Spanish-speaking female here for follow-up of epigastric abdominal pain ? ?She is accompanied by language interpreter ?Symptoms are somewhat better in the past few weeks but continues to have intermittent discomfort ?  ?She continues to have intermittent epigastric abdominal pain after most meals and sometimes even when she tries to drink water. ? ?She also complains of chest discomfort, shortness of breath and exertional dyspnea.  She has history of chronic migraine headaches, is using Tylenol as needed.  Denies NSAID use ?  ?EGD February 2023: ?- Z-line regular, 36 cm from the incisors. ?- No gross lesions in esophagus. ?- Gastritis. Biopsied.  H. pylori negative ?- Normal examined duodenum. ? ?Colonoscopy July 16, 2021: ?- Non-bleeding external and internal hemorrhoids were found during retroflexion. The ?hemorrhoids were medium-sized. ?- A large amount of semi-liquid semi-solid stool was found in the entire colon, interfering with ?visualization. Lavage of the area was performed, resulting in incomplete clearance with fair ?visualization. ?  ?EGD in 2005 by Dr.Kaplan : Normal ? ? ?Outpatient Encounter Medications as of 08/08/2021  ?Medication Sig  ? amitriptyline (ELAVIL) 25 MG tablet Take 1 tablet (25 mg total) by mouth at bedtime.  ? aspirin EC 81 MG tablet Take 1 tablet (81 mg total) by mouth daily. (Patient taking differently: Take 81 mg by mouth daily as needed.)  ? atorvastatin (LIPITOR) 20 MG tablet TAKE ONE TABLET BY MOUTH AT [place]  ? esomeprazole (NEXIUM) 40 MG capsule Take 1 capsule (40 mg total) by mouth daily.  ? lisinopril-hydrochlorothiazide (ZESTORETIC) 20-25 MG tablet Take 1 tablet by mouth  daily.  ? metFORMIN (GLUCOPHAGE) 500 MG tablet Take 1 tablet (500 mg total) by mouth 2 (two) times daily with a meal.  ? Multiple Vitamin (MULTIVITAMIN ADULT PO) Take 1 tablet by mouth daily.  ? ondansetron (ZOFRAN) 4 MG tablet Take 1 tablet (4 mg total) by mouth daily as needed for nausea or vomiting.  ? ?No facility-administered encounter medications on file as of 08/08/2021.  ? ? ?Allergies as of 08/08/2021  ? (No Known Allergies)  ? ? ?Past Medical History:  ?Diagnosis Date  ? Acid reflux   ? Acute UTI 05/19/2018  ? Chest pain   ? 09/15/2017 - normal stress echo  ? Chronic migraine w/o aura w/o status migrainosus, not intractable 08/10/2017  ? Dysplasia of cervix, low grade (CIN 1) 10/30/2015  ? 09/2015- LSIL 10/2015- CIN 1 09/2016- NIL- no cotest done 04/2018- NIL, +HPV 06/2019- LSIL, HPV POS 07/2019- CIN 1  ? Endometriosis   ? Essential hypertension, benign 07/25/2012  ? Gastritis   ? Hypertension   ? Migraine   ? OSA (obstructive sleep apnea) 12/29/2018  ? Plantar fasciitis of right foot 10/05/2013  ? Varicose veins   ? ? ?Past Surgical History:  ?Procedure Laterality Date  ? CESAREAN SECTION    ? She had 5 C-Sections  ? ? ?Family History  ?Problem Relation Age of Onset  ? Hypertension Mother   ? Thyroid disease Mother   ? Stroke Father   ? Hypertension Father   ? Diabetes Father   ? Heart attack Father   ? Colon cancer Neg Hx   ? Esophageal cancer Neg  Hx   ? Pancreatic cancer Neg Hx   ? Stomach cancer Neg Hx   ? Liver disease Neg Hx   ? ? ?Social History  ? ?Socioeconomic History  ? Marital status: Married  ?  Spouse name: Not on file  ? Number of children: 5  ? Years of education: Not on file  ? Highest education level: Bachelor's degree (e.g., BA, AB, BS)  ?Occupational History  ? Occupation: Scientist, water quality  ?Tobacco Use  ? Smoking status: Never  ? Smokeless tobacco: Never  ?Vaping Use  ? Vaping Use: Never used  ?Substance and Sexual Activity  ? Alcohol use: No  ?  Alcohol/week: 0.0 standard drinks  ? Drug use: No  ? Sexual  activity: Yes  ?  Birth control/protection: Post-menopausal  ?Other Topics Concern  ? Not on file  ?Social History Narrative  ? Lives at home with her husband and children  ? Drinks 1 cup of decaf coffee daily, sometimes drinks soda, mostly water  ? Right handed  ? ?Social Determinants of Health  ? ?Financial Resource Strain: Not on file  ?Food Insecurity: Not on file  ?Transportation Needs: Not on file  ?Physical Activity: Not on file  ?Stress: Not on file  ?Social Connections: Not on file  ?Intimate Partner Violence: Not on file  ? ? ? ? ?Review of systems: ?All other review of systems negative except as mentioned in the HPI. ? ? ?Physical Exam: ?Vitals:  ? 08/08/21 1012  ?BP: 128/80  ?Pulse: 84  ? ?Body mass index is 24.36 kg/m?. ?Gen:      No acute distress ?HEENT:  sclera anicteric ?Abd:      soft, non-tender; no palpable masses, no distension ?Ext:    No edema ?Neuro: alert and oriented x 3 ?Psych: normal mood and affect ? ?Data Reviewed: ? ?Reviewed labs, radiology imaging, old records and pertinent past GI work up ? ? ?Assessment and Plan/Recommendations: ? ?56 year old very pleasant Spanish-speaking female with complaints of epigastric abdominal pain  ? ?Evidence of gastritis on EGD otherwise unremarkable exam.  Gastric biopsies negative for H. pylori active infection ?Continue Nexium 40 mg daily ?Avoid NSAIDs ? ?History of migraine headaches: Use Tylenol up to 2 g/day in divided doses as needed.  If continues to have persistent symptoms, advised patient to discuss with PCP for referral to neurology for management of migraines. ? ?We will schedule for right upper quadrant abdominal ultrasound to exclude cholecystitis or cholelithiasis ? ?She has family history of heart disease in multiple first and second-degree relatives, complaints of chest pain with exertional dyspnea.  Advised patient to follow-up with cardiology for further evaluation to exclude cardiac related chest pain or angina ? ?Colonoscopy with  inadequate bowel prep, plan to reschedule colonoscopy with extended bowel prep for colorectal cancer screening once cleared by cardiology ? ?Return in 3 to 4 months ? ?This visit required >40 minutes of patient care (this includes precharting, chart review, review of results, face-to-face time used for counseling as well as treatment plan and follow-up. The patient was provided an opportunity to ask questions and all were answered. The patient agreed with the plan and demonstrated an understanding of the instructions. ? ?K. Denzil Magnuson , MD ?  ? ?CC: Horald Pollen, * ? ? ?

## 2021-08-12 ENCOUNTER — Other Ambulatory Visit: Payer: Self-pay

## 2021-08-12 ENCOUNTER — Ambulatory Visit: Payer: Self-pay | Admitting: *Deleted

## 2021-08-12 ENCOUNTER — Ambulatory Visit: Payer: Self-pay

## 2021-08-12 VITALS — BP 136/74 | Wt 134.1 lb

## 2021-08-12 DIAGNOSIS — Z01419 Encounter for gynecological examination (general) (routine) without abnormal findings: Secondary | ICD-10-CM

## 2021-08-12 NOTE — Patient Instructions (Addendum)
Explained breast self awareness with [name] [name]. Pap smear completed today. Let patient know that if today's Pap smear is normal and HPV negative that her next Pap smear is due in one year due to her history of an abnormal Pap smear. Referred patient to [facility] for a screening mammogram. Appointment scheduled Tuesday, August 12, 2021 at 1600. Patient aware of appointment and will be there. Let patient know will follow up with her within the next couple weeks with results of Pap smear by phone. Informed patient that [facility] will follow up with her within the next couple of weeks with results of her mammogram by letter or phone. [name] [name] verbalized understanding. ? ?[name] [name], Arvil Chaco, RN ?3:11 PM ? ? ? ? ?

## 2021-08-12 NOTE — Progress Notes (Signed)
[name] [name] is a 56 y.o. [id number] female who presents to [facility] clinic today with no complaints.  ?  ?Pap Smear: Pap smear completed today. Last Pap smear was 07/04/2019 at [facility] and LSIL with positive HPV that a colposcopy was completed for follow up 08/03/2019 that showed CIN-I. Patients previous Pap smears were 04/19/2018 at [facility] and normal with positive HPV and 09/14/2016 at the free cervical cancer screening sponsored by [facility] and normal. Patient has a history of one other abnormal Pap smear 09/09/2015 in [facility] that was LGSIL with positive HPV that a colposcopy was completed for follow-up 10/28/2015 that showed CIN-I. Last four Pap smears and two colposcopy results are in Epic.   ?  ?Physical exam: ?Breasts ?Breasts symmetrical. No skin abnormalities bilateral breasts. No nipple retraction bilateral breasts. No nipple discharge bilateral breasts. No lymphadenopathy. No lumps palpated bilateral breasts. No complaints of pain or tenderness on exam.     ?  ?Pelvic/Bimanual ?Ext Genitalia ?No lesions, no swelling and no discharge observed on external genitalia.      ?  ?Vagina ?Vagina pink and normal texture. No lesions or discharge observed in vagina.      ?  ?Cervix ?Cervix is present. Cervix pink and of normal texture. No discharge observed.  ?  ?Uterus ?Uterus is present and palpable. Uterus in normal position and normal size.      ?  ?Adnexae ?Bilateral ovaries present and palpable. No tenderness on palpation.       ?  ?Rectovaginal ?No rectal exam completed today since patient had no rectal complaints. No skin abnormalities observed on exam.   ?  ?Smoking History: ?Patient has never smoked. ?  ?Patient Navigation: ?Patient education provided. Access to services provided for patient through [facility] program. Spanish interpreter [name] [name] from [facility] provided.  ? ?Colorectal Cancer Screening: ?Per patient has had colonoscopy completed on 07/16/2021 that has another one  scheduled 08/15/2021 per recommendation.  No complaints today.  ?  ?Breast and Cervical Cancer Risk Assessment: ?Patient has no family history of breast cancer, known genetic mutations, or radiation treatment to the chest before age 8. Patient has a history of cervical dysplasia. Patient has not history of being immunocompromised or DES exposure in-utero. ? ?Risk Assessment   ? ? Risk Scores   ? ?   08/12/2021 07/04/2019  ? Last edited by: Demetrius Revel, LPN Dexter Sauser, Heath Gold, RN  ? 5-year risk: 0.8 % 0.7 %  ? Lifetime risk: 5.8 % 6 %  ? ?  ?  ? ?  ? ? ?A: ?B[facility]CP exam with pap smear ?No complaints. ? ?P: ?Referred patient to [facility] for a screening mammogram. Appointment scheduled Tuesday, August 12, 2021 at 1600. ? ?Loletta Parish, RN ?08/12/2021 3:11 PM   ?

## 2021-08-14 LAB — CYTOLOGY - PAP
Adequacy: ABSENT
Comment: NEGATIVE
Diagnosis: NEGATIVE
High risk HPV: NEGATIVE

## 2021-08-15 ENCOUNTER — Ambulatory Visit (HOSPITAL_COMMUNITY): Admission: RE | Admit: 2021-08-15 | Payer: Self-pay | Source: Ambulatory Visit

## 2021-08-15 ENCOUNTER — Encounter: Payer: Self-pay | Admitting: Gastroenterology

## 2021-08-15 ENCOUNTER — Telehealth: Payer: Self-pay | Admitting: Gastroenterology

## 2021-08-15 DIAGNOSIS — Z1211 Encounter for screening for malignant neoplasm of colon: Secondary | ICD-10-CM

## 2021-08-15 NOTE — Telephone Encounter (Signed)
Yes agree, thanks ?

## 2021-08-15 NOTE — Telephone Encounter (Signed)
Called patient to see if she was coming for her procedure today and she stated that she never received a phone call and did not prep.  Did not see any prep instructions given to patient. ? ?Scheduling will call her to reschedule. ?

## 2021-08-15 NOTE — Telephone Encounter (Signed)
Got it. ?Shirlean Mylar, can you please help patient reschedule. I think due to language barrier patient left office without getting instructions ?Thanks ?

## 2021-08-15 NOTE — Telephone Encounter (Signed)
[name] [name] can you schedule this patient for a colonoscopy for me. Spanish Speaking  Let her know we will be calling her to pick up instructions    ?

## 2021-08-15 NOTE — Telephone Encounter (Signed)
Dr Camillia Herter helped me schedule patient. She is scheduled for 5/2  I will mail her Spanish instructions next week  I think she had to leave that day. I dont know. Still not sure why she waited till today to call... Sorry  ?

## 2021-08-15 NOTE — Telephone Encounter (Signed)
Pt r/s to 10/07/21 at 9:30 am.  ?

## 2021-08-18 NOTE — Telephone Encounter (Signed)
Mailed patient new Spanish Miralax instructions today.  Created new amb referral   ?

## 2021-08-21 ENCOUNTER — Other Ambulatory Visit: Payer: Self-pay | Admitting: *Deleted

## 2021-08-21 ENCOUNTER — Telehealth: Payer: Self-pay

## 2021-08-21 ENCOUNTER — Other Ambulatory Visit: Payer: Self-pay

## 2021-08-21 MED ORDER — METRONIDAZOLE 500 MG PO TABS: 500.0000 mg | ORAL_TABLET | Freq: Two times a day (BID) | ORAL | 0 refills | Status: AC

## 2021-08-21 NOTE — Telephone Encounter (Signed)
Dr Silverio Decamp I found my sheet on this patient. It looks like she didn't have insurance and we gave her the account sheet for the cost of the procedure. She needed a RUQ [place] which was cancelled. Iv entered a new order for that and also she needs a cardiac clearance which Im not sure who that is? Is it Vein and vascular?  Ill have to get Barbie Haggis or Tye Maryland to help me with her. Big Language barrier.  Thanks  ?

## 2021-08-21 NOTE — Telephone Encounter (Signed)
She was rescheduled for the procedure in May, please make sure she understands the procedures and cost involved so she doesn't no show again. If patient is not willing to proceed due to lack of insurance we will have to reschedule once she has insurance. Thanks ?

## 2021-08-21 NOTE — Telephone Encounter (Signed)
08/21/2021. Via Lavon Paganini, Spanish Interpreter, Patient informed Pap/HPV-negative, shift in flora suggestive of Bacterila vaginosis Patient stated that she is having some vaginal itching, odor, and discharge. Pharmacy: [facility] [address] Rx sent.  ?

## 2021-08-27 ENCOUNTER — Other Ambulatory Visit: Payer: Self-pay

## 2021-08-27 ENCOUNTER — Ambulatory Visit (HOSPITAL_COMMUNITY)
Admission: RE | Admit: 2021-08-27 | Discharge: 2021-08-27 | Disposition: A | Discharge status: Home / Self Care | Payer: Self-pay | Source: Ambulatory Visit | Attending: Gastroenterology | Admitting: Gastroenterology

## 2021-08-27 DIAGNOSIS — R079 Chest pain, unspecified: Secondary | ICD-10-CM | POA: Insufficient documentation

## 2021-08-27 DIAGNOSIS — R1013 Epigastric pain: Secondary | ICD-10-CM | POA: Insufficient documentation

## 2021-08-27 DIAGNOSIS — R110 Nausea: Secondary | ICD-10-CM | POA: Insufficient documentation

## 2021-09-23 NOTE — Telephone Encounter (Signed)
Sorry Tye Maryland, But when you get time which I know you are busy. Will you check to make sure she received and understands her instructions. If not she will need a previsit. Also make sure she has had her insurance fixed. See Dr Jillyn Hidden note on the encounter.. I owe you again.Marland Kitchen   ?

## 2021-09-24 NOTE — Telephone Encounter (Signed)
[name] [name], I spoke with pt's daughter Tito Dine as I was not able to get a hold of pt. She stated that [place] is out of [place] and will return next week. She stated that patient is still uninsured. She is going to speak with her if she wants to keep procedure appt paying out of pocket. She is aware of the self pay estimate and she will call back next week with an answer. Thank you.  ?

## 2021-09-25 NOTE — Telephone Encounter (Signed)
Okay  Thank you Tye Maryland!  ?

## 2021-10-06 ENCOUNTER — Telehealth: Payer: Self-pay | Admitting: Gastroenterology

## 2021-10-06 NOTE — Telephone Encounter (Signed)
Patient called and said she didn't feel good and didn't want to take the prep today.  She rescheduled for 7/11. ?

## 2021-10-07 ENCOUNTER — Encounter: Payer: Self-pay | Admitting: Gastroenterology

## 2021-10-07 NOTE — Telephone Encounter (Signed)
She has rescheduled multiple times already, language barrier is likely playing a role.  Please let patient be aware that if she does it again she will be charged according to cancellation policy ?

## 2021-12-16 ENCOUNTER — Ambulatory Visit (AMBULATORY_SURGERY_CENTER): Payer: Self-pay | Admitting: Gastroenterology

## 2021-12-16 ENCOUNTER — Encounter: Payer: Self-pay | Admitting: Gastroenterology

## 2021-12-16 VITALS — BP 135/82 | HR 69 | Temp 98.1°F | Resp 9 | Ht 62.0 in | Wt 133.0 lb

## 2021-12-16 DIAGNOSIS — K635 Polyp of colon: Secondary | ICD-10-CM

## 2021-12-16 DIAGNOSIS — Z1211 Encounter for screening for malignant neoplasm of colon: Secondary | ICD-10-CM

## 2021-12-16 DIAGNOSIS — D120 Benign neoplasm of cecum: Secondary | ICD-10-CM

## 2021-12-16 MED ORDER — SODIUM CHLORIDE 0.9 % IV SOLN: 500.0000 mL | Freq: Once | INTRAVENOUS | Status: DC

## 2021-12-16 NOTE — Progress Notes (Signed)
Report to PACU, RN, vss, BBS= Clear.  

## 2021-12-16 NOTE — Progress Notes (Signed)
[facility] History and Physical   Primary Care Physician:  Horald Pollen, MD   Reason for Procedure:  Colorectal cancer screening  Plan:    Screening colonoscopy with possible interventions as needed     HPI: [name] [name] is a very pleasant 56 y.o. female here for screening colonoscopy. Denies any nausea, vomiting, abdominal pain, melena or bright red blood per rectum  The risks and benefits as well as alternatives of endoscopic procedure(s) have been discussed and reviewed. All questions answered. The patient agrees to proceed.    Past Medical History:  Diagnosis Date   Acid reflux    Acute UTI 05/19/2018   Chest pain    09/15/2017 - normal stress echo   Chronic migraine w/o aura w/o status migrainosus, not intractable 08/10/2017   Diabetes mellitus without complication (HCC)    Dysplasia of cervix, low grade (CIN 1) 10/30/2015   09/2015- LSIL 10/2015- CIN 1 09/2016- NIL- no cotest done 04/2018- NIL, +HPV 06/2019- LSIL, HPV POS 07/2019- CIN 1   Endometriosis    Essential hypertension, benign 07/25/2012   Gastritis    Hyperlipidemia    Hypertension    Migraine    OSA (obstructive sleep apnea) 12/29/2018   does not use CPAP   Plantar fasciitis of right foot 10/05/2013   Varicose veins     Past Surgical History:  Procedure Laterality Date   CESAREAN SECTION     She had 5 C-Sections   COLONOSCOPY      Prior to Admission medications   Medication Sig Start Date End Date Taking? Authorizing Provider  amitriptyline (ELAVIL) 25 MG tablet Take 1 tablet (25 mg total) by mouth at bedtime. 11/14/20 08/09/22 Yes SagardiaInes Bloomer, MD  aspirin EC 81 MG tablet Take 1 tablet (81 mg total) by mouth daily. Patient taking differently: Take 81 mg by mouth daily as needed. 12/08/19  Yes Jacelyn Pi, Lilia Argue, MD  atorvastatin (LIPITOR) 20 MG tablet TAKE ONE TABLET BY MOUTH AT BEDTINE 05/20/21  Yes Sagardia, Ines Bloomer, MD  esomeprazole (NEXIUM) 40 MG capsule Take 1  capsule (40 mg total) by mouth daily. 06/20/21  Yes Randell Detter, Venia Minks, MD  lisinopril-hydrochlorothiazide (ZESTORETIC) 20-25 MG tablet Take 1 tablet by mouth daily.   Yes [provider]  metFORMIN (GLUCOPHAGE) 500 MG tablet Take 1 tablet (500 mg total) by mouth 2 (two) times daily with a meal. 09/18/20  Yes Sagardia, Ines Bloomer, MD  Multiple Vitamin (MULTIVITAMIN ADULT PO) Take 1 tablet by mouth daily.   Yes [provider]  ondansetron (ZOFRAN) 4 MG tablet Take 1 tablet (4 mg total) by mouth daily as needed for nausea or vomiting. Patient not taking: Reported on 12/16/2021 06/20/21   Mauri Pole, MD    Current Outpatient Medications  Medication Sig Dispense Refill   amitriptyline (ELAVIL) 25 MG tablet Take 1 tablet (25 mg total) by mouth at bedtime. 90 tablet 3   aspirin EC 81 MG tablet Take 1 tablet (81 mg total) by mouth daily. (Patient taking differently: Take 81 mg by mouth daily as needed.) 90 tablet 3   atorvastatin (LIPITOR) 20 MG tablet TAKE ONE TABLET BY MOUTH AT BEDTINE 90 tablet 3   esomeprazole (NEXIUM) 40 MG capsule Take 1 capsule (40 mg total) by mouth daily. 30 capsule 3   lisinopril-hydrochlorothiazide (ZESTORETIC) 20-25 MG tablet Take 1 tablet by mouth daily.     metFORMIN (GLUCOPHAGE) 500 MG tablet Take 1 tablet (500 mg total) by mouth 2 (two)  times daily with a meal. 180 tablet 3   Multiple Vitamin (MULTIVITAMIN ADULT PO) Take 1 tablet by mouth daily.     ondansetron (ZOFRAN) 4 MG tablet Take 1 tablet (4 mg total) by mouth daily as needed for nausea or vomiting. (Patient not taking: Reported on 12/16/2021) 30 tablet 0   Current Facility-Administered Medications  Medication Dose Route Frequency Provider Last Rate Last Admin   0.9 %  sodium chloride infusion  500 mL Intravenous Once Mauri Pole, MD        Allergies as of 12/16/2021   (No Known Allergies)    Family History  Problem Relation Age of Onset   Hypertension Mother    Thyroid  disease Mother    Stroke Father    Hypertension Father    Diabetes Father    Heart attack Father    Colon cancer Neg Hx    Esophageal cancer Neg Hx    Pancreatic cancer Neg Hx    Stomach cancer Neg Hx    Liver disease Neg Hx    Rectal cancer Neg Hx     Social History   Socioeconomic History   Marital status: Married    Spouse name: Not on file   Number of children: 5   Years of education: Not on file   Highest education level: Bachelor's degree (e.g., BA, AB, BS)  Occupational History   Occupation: Scientist, water quality  Tobacco Use   Smoking status: Never   Smokeless tobacco: Never  Vaping Use   Vaping Use: Never used  Substance and Sexual Activity   Alcohol use: No    Alcohol/week: 0.0 standard drinks of alcohol   Drug use: No   Sexual activity: Yes    Birth control/protection: Post-menopausal  Other Topics Concern   Not on file  Social History Narrative   Lives at home with her husband and children   Drinks 1 cup of decaf coffee daily, sometimes drinks soda, mostly water   Right handed   Social Determinants of Health   Financial Resource Strain: Not on file  Food Insecurity: No Food Insecurity (08/12/2021)   Hunger Vital Sign    Worried About Running Out of Food in the Last Year: Never true    Ran Out of Food in the Last Year: Never true  Transportation Needs: No Transportation Needs (08/12/2021)   PRAPARE - Hydrologist (Medical): No    Lack of Transportation (Non-Medical): No  Physical Activity: Inactive (06/24/2017)   Exercise Vital Sign    Days of Exercise per Week: 0 days    Minutes of Exercise per Session: 0 min  Stress: Stress Concern Present (06/24/2017)   [place]    Feeling of Stress : Rather much  Social Connections: Socially Integrated (06/24/2017)   Social Connection and Isolation Panel [NHANES]    Frequency of Communication with Friends and Family: More than three  times a week    Frequency of Social Gatherings with Friends and Family: More than three times a week    Attends Religious Services: More than 4 times per year    Active Member of [employer] or Organizations: Yes    Attends Archivist Meetings: More than 4 times per year    Marital Status: Married  Human resources officer Violence: Not At Risk (06/24/2017)   Humiliation, Afraid, Rape, and Kick questionnaire    Fear of Current or Ex-Partner: No    Emotionally Abused: No  Physically Abused: No    Sexually Abused: No    Review of Systems:  All other review of systems negative except as mentioned in the HPI.  Physical Exam: Vital signs in last 24 hours: BP 132/89   Pulse 88   Temp 98.1 F (36.7 C) (Temporal)   Ht '5\' 2"'$  (1.575 m)   Wt 133 lb (60.3 kg)   LMP 05/12/2012   SpO2 100%   BMI 24.33 kg/m  General:   Alert, NAD Lungs:  Clear .   Heart:  Regular rate and rhythm Abdomen:  Soft, nontender and nondistended. Neuro/Psych:  Alert and cooperative. Normal mood and affect. A and O x 3  Reviewed labs, radiology imaging, old records and pertinent past GI work up  Patient is appropriate for planned procedure(s) and anesthesia in an ambulatory setting   K. Denzil Magnuson , MD [phone]

## 2021-12-16 NOTE — Progress Notes (Signed)
CARDIOLOGY CONSULT NOTE       Patient ID: [name] [name] MRN: [id number] DOB/AGE: 12/20/1965 56 y.o.  Admit date: (Not on file) Referring Physician: Sagardia Primary Physician: [name] Pollen, MD Primary Cardiologist: [name] [name] Reason for Consultation: Chest pain  Active Problems:   * No active hospital problems. *   HPI:  56 y.o. previously seen by NP [name] [name] has not been seen since June 2021  History of atypical chest pain with multiple normal stress tests most recently GXT January 2021 Calcium score at that time also quite low at 1.82Also had normal stress echo in 2019 She has CRF;s HLD and HTN. She has been on lipitor and ACE/HCTZ.  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  No chest pain   ROS All other systems reviewed and negative except as noted above  Past Medical History:  Diagnosis Date   Acid reflux    Acute UTI 05/19/2018   Chest pain    09/15/2017 - normal stress echo   Chronic migraine w/o aura w/o status migrainosus, not intractable 08/10/2017   Diabetes mellitus without complication (HCC)    Dysplasia of cervix, low grade (CIN 1) 10/30/2015   09/2015- LSIL 10/2015- CIN 1 09/2016- NIL- no cotest done 04/2018- NIL, +HPV 06/2019- LSIL, HPV POS 07/2019- CIN 1   Endometriosis    Essential hypertension, benign 07/25/2012   Gastritis    Hyperlipidemia    Hypertension    Migraine    OSA (obstructive sleep apnea) 12/29/2018   does not use CPAP   Plantar fasciitis of right foot 10/05/2013   Varicose veins     Family History  Problem Relation Age of Onset   Hypertension Mother    Thyroid disease Mother    Stroke Father    Hypertension Father    Diabetes Father    Heart attack Father    Colon cancer Neg Hx    Esophageal cancer Neg Hx    Pancreatic cancer Neg Hx    Stomach cancer Neg Hx    Liver disease Neg Hx    Rectal  cancer Neg Hx     Social History   Socioeconomic History   Marital status: Married    Spouse name: Not on file   Number of children: 5   Years of education: Not on file   Highest education level: Bachelor's degree (e.g., BA, AB, BS)  Occupational History   Occupation: Scientist, water quality  Tobacco Use   Smoking status: Never   Smokeless tobacco: Never  Vaping Use   Vaping Use: Never used  Substance and Sexual Activity   Alcohol use: No    Alcohol/week: 0.0 standard drinks of alcohol   Drug use: No   Sexual activity: Yes    Birth control/protection: Post-menopausal  Other Topics Concern   Not on file  Social History Narrative   Lives at home with her husband and children   Drinks 1 cup of decaf coffee daily, sometimes drinks soda, mostly water   Right handed   Social Determinants of Health   Financial Resource Strain: Not on file  Food Insecurity: No Food Insecurity (08/12/2021)   Hunger Vital Sign    Worried About Running Out of Food in the Last Year: Never true    Ran Out of Food in the Last Year: Never true  Transportation Needs:  No Transportation Needs (08/12/2021)   PRAPARE - Hydrologist (Medical): No    Lack of Transportation (Non-Medical): No  Physical Activity: Inactive (06/24/2017)   Exercise Vital Sign    Days of Exercise per Week: 0 days    Minutes of Exercise per Session: 0 min  Stress: Stress Concern Present (06/24/2017)   [place]    Feeling of Stress : Rather much  Social Connections: Socially Integrated (06/24/2017)   Social Connection and Isolation Panel [NHANES]    Frequency of Communication with Friends and Family: More than three times a week    Frequency of Social Gatherings with Friends and Family: More than three times a week    Attends Religious Services: More than 4 times per year    Active Member of [employer] or Organizations: Yes    Attends Arts administrator: More than 4 times per year    Marital Status: Married  Human resources officer Violence: Not At Risk (06/24/2017)   Humiliation, Afraid, Rape, and Kick questionnaire    Fear of Current or Ex-Partner: No    Emotionally Abused: No    Physically Abused: No    Sexually Abused: No    Past Surgical History:  Procedure Laterality Date   CESAREAN SECTION     She had 5 C-Sections   COLONOSCOPY        Current Outpatient Medications:    amitriptyline (ELAVIL) 25 MG tablet, Take 1 tablet (25 mg total) by mouth at bedtime., Disp: 90 tablet, Rfl: 3   aspirin EC 81 MG tablet, Take 1 tablet (81 mg total) by mouth daily. (Patient taking differently: Take 81 mg by mouth daily as needed.), Disp: 90 tablet, Rfl: 3   atorvastatin (LIPITOR) 20 MG tablet, TAKE ONE TABLET BY MOUTH AT BEDTINE, Disp: 90 tablet, Rfl: 3   esomeprazole (NEXIUM) 40 MG capsule, Take 1 capsule (40 mg total) by mouth daily., Disp: 30 capsule, Rfl: 3   lisinopril-hydrochlorothiazide (ZESTORETIC) 20-25 MG tablet, Take 1 tablet by mouth daily., Disp: , Rfl:    metFORMIN (GLUCOPHAGE) 500 MG tablet, Take 1 tablet (500 mg total) by mouth 2 (two) times daily with a meal., Disp: 180 tablet, Rfl: 3   Multiple Vitamin (MULTIVITAMIN ADULT PO), Take 1 tablet by mouth daily., Disp: , Rfl:    ondansetron (ZOFRAN) 4 MG tablet, Take 1 tablet (4 mg total) by mouth daily as needed for nausea or vomiting. (Patient not taking: Reported on 12/16/2021), Disp: 30 tablet, Rfl: 0    Physical Exam: Blood pressure 124/80, pulse (!) 58, height '5\' 2"'$  (1.575 m), weight 136 lb (61.7 kg), last menstrual period 05/12/2012, SpO2 96 %.  Affect appropriate Healthy:  appears stated age 56: normal Neck supple with no adenopathy JVP normal no bruits no thyromegaly Lungs clear with no wheezing and good diaphragmatic motion Heart:  S1/S2 no murmur, no rub, gallop or click PMI normal Abdomen: benighn, BS positve, no tenderness, no AAA no bruit.  No HSM or  HJR Distal pulses intact with no bruits No edema Neuro non-focal Skin warm and dry No muscular weakness   Labs:   Lab Results  Component Value Date   WBC 4.1 09/17/2020   HGB 14.0 09/17/2020   HCT 41.6 09/17/2020   MCV 82.4 09/17/2020   PLT 215.0 09/17/2020   No results for input(s): "NA", "K", "CL", "CO2", "BUN", "CREATININE", "CALCIUM", "PROT", "BILITOT", "ALKPHOS", "ALT", "AST", "GLUCOSE" in the last 168  hours.  Invalid input(s): "LABALBU" Lab Results  Component Value Date   TROPONINI <0.03 11/17/2018    Lab Results  Component Value Date   CHOL 151 05/19/2021   CHOL 178 09/17/2020   CHOL 145 12/08/2019   Lab Results  Component Value Date   HDL 37.20 (L) 05/19/2021   HDL 41.80 09/17/2020   HDL 34 (L) 12/08/2019   Lab Results  Component Value Date   LDLCALC 79 05/19/2021   LDLCALC 111 (H) 09/17/2020   LDLCALC 68 12/08/2019   Lab Results  Component Value Date   TRIG 174.0 (H) 05/19/2021   TRIG 124.0 09/17/2020   TRIG 265 (H) 12/08/2019   Lab Results  Component Value Date   CHOLHDL 4 05/19/2021   CHOLHDL 4 09/17/2020   CHOLHDL 4.3 12/08/2019   No results found for: "LDLDIRECT"    Radiology: No results found.  EKG: SR rate 76 normal isolated Q in lead 3 05/30/19   ASSESSMENT AND PLAN:   Chest Pain: normal ECG low calcium score normal GXT 06/13/19 and previous normal myovue 2017 and stress echo in 2019 Update calcium score  HTN: Well controlled.  Continue current medications and low sodium Dash type diet.   DM: Discussed low carb diet.  Target hemoglobin A1c is 6.5 or less.  Continue current medications. HLDL  On lipitor see above regarding calcium score  GI:  post colonoscopy on nexium F/U Nandigam   F/U in a year  Signed: Jenkins Rouge 12/25/2021, 8:48 AM

## 2021-12-16 NOTE — Progress Notes (Signed)
Called to room to assist during endoscopic procedure.  Patient ID and intended procedure confirmed with present staff. Received instructions for my participation in the procedure from the performing physician.  

## 2021-12-16 NOTE — Patient Instructions (Signed)
Await pathology results.  Handout on polyps given.  YOU HAD AN ENDOSCOPIC PROCEDURE TODAY AT [facility]:   Refer to the procedure report that was given to you for any specific questions about what was found during the examination.  If the procedure report does not answer your questions, please call your gastroenterologist to clarify.  If you requested that your care partner not be given the details of your procedure findings, then the procedure report has been included in a sealed envelope for you to review at your convenience later.  YOU SHOULD EXPECT: Some feelings of bloating in the abdomen. Passage of more gas than usual.  Walking can help get rid of the air that was put into your GI tract during the procedure and reduce the bloating. If you had a lower endoscopy (such as a colonoscopy or flexible sigmoidoscopy) you may notice spotting of blood in your stool or on the toilet paper. If you underwent a bowel prep for your procedure, you may not have a normal bowel movement for a few days.  Please Note:  You might notice some irritation and congestion in your nose or some drainage.  This is from the oxygen used during your procedure.  There is no need for concern and it should clear up in a day or so.  SYMPTOMS TO REPORT IMMEDIATELY:  Following lower endoscopy (colonoscopy or flexible sigmoidoscopy):  Excessive amounts of blood in the stool  Significant tenderness or worsening of abdominal pains  Swelling of the abdomen that is new, acute  Fever of 100F or higher  For urgent or emergent issues, a gastroenterologist can be reached at any hour by calling [phone]. Do not use MyChart messaging for urgent concerns.    DIET:  We do recommend a small meal at first, but then you may proceed to your regular diet.  Drink plenty of fluids but you should avoid alcoholic beverages for 24 hours.  ACTIVITY:  You should plan to take it easy for the rest of today and you should NOT  DRIVE or use heavy machinery until tomorrow (because of the sedation medicines used during the test).    FOLLOW UP: Our staff will call the number listed on your records the next business day following your procedure.  We will call around 7:15- 8:00 am to check on you and address any questions or concerns that you may have regarding the information given to you following your procedure. If we do not reach you, we will leave a message.  If you develop any symptoms (ie: fever, flu-like symptoms, shortness of breath, cough etc.) before then, please call [phone].  If you test positive for Covid 19 in the 2 weeks post procedure, please call and report this information to [place].    If any biopsies were taken you will be contacted by phone or by letter within the next 1-3 weeks.  Please call us at [phone] if you have not heard about the biopsies in 3 weeks.    SIGNATURES/CONFIDENTIALITY: You and/or your care partner have signed paperwork which will be entered into your electronic medical record.  These signatures attest to the fact that that the information above on your After Visit Summary has been reviewed and is understood.  Full responsibility of the confidentiality of this discharge information lies with you and/or your care-partner.

## 2021-12-16 NOTE — Op Note (Signed)
[place] Patient Name: [name] [name] Procedure Date: 12/16/2021 11:11 AM MRN: [id number] Endoscopist: Mauri Pole , MD Age: 56 Referring MD:  Date of Birth: January 18, 1966 Gender: Female Account #: [id number] Procedure:                Colonoscopy Indications:              Screening for colorectal malignant neoplasm Medicines:                Monitored Anesthesia Care Procedure:                Pre-Anesthesia Assessment:                           - Prior to the procedure, a History and Physical                            was performed, and patient medications and                            allergies were reviewed. The patient's tolerance of                            previous anesthesia was also reviewed. The risks                            and benefits of the procedure and the sedation                            options and risks were discussed with the patient.                            All questions were answered, and informed consent                            was obtained. Prior Anticoagulants: The patient has                            taken no previous anticoagulant or antiplatelet                            agents. ASA Grade Assessment: II - A patient with                            mild systemic disease. After reviewing the risks                            and benefits, the patient was deemed in                            satisfactory condition to undergo the procedure.                           After obtaining informed consent, the colonoscope  was passed under direct vision. Throughout the                            procedure, the patient's blood pressure, pulse, and                            oxygen saturations were monitored continuously. The                            PCF-HQ190L Colonoscope was introduced through the                            anus and advanced to the the cecum, identified by                             appendiceal orifice and ileocecal valve. The                            colonoscopy was performed without difficulty. The                            patient tolerated the procedure well. The quality                            of the bowel preparation was good. The ileocecal                            valve, appendiceal orifice, and rectum were                            photographed. Scope In: 11:17:57 AM Scope Out: 11:28:30 AM Scope Withdrawal Time: 0 hours 6 minutes 41 seconds  Total Procedure Duration: 0 hours 10 minutes 33 seconds  Findings:                 The perianal and digital rectal examinations were                            normal.                           A 1 mm polyp was found in the cecum. The polyp was                            sessile. The polyp was removed with a cold biopsy                            forceps. Resection and retrieval were complete.                           Non-bleeding external and internal hemorrhoids were                            found during retroflexion. The hemorrhoids were  medium-sized. Complications:            No immediate complications. Estimated Blood Loss:     Estimated blood loss was minimal. Impression:               - One 1 mm polyp in the cecum, removed with a cold                            biopsy forceps. Resected and retrieved.                           - Non-bleeding external and internal hemorrhoids. Recommendation:           - Patient has a contact number available for                            emergencies. The signs and symptoms of potential                            delayed complications were discussed with the                            patient. Return to normal activities tomorrow.                            Written discharge instructions were provided to the                            patient.                           - Resume previous diet.                           - Continue present  medications.                           - Await pathology results.                           - Repeat colonoscopy in 5-10 years for surveillance                            based on pathology results. Mauri Pole, MD 12/16/2021 11:34:19 AM This report has been signed electronically.

## 2021-12-16 NOTE — Progress Notes (Signed)
Daughter, Myrtha Mantis is interpreting today.

## 2021-12-17 ENCOUNTER — Telehealth: Payer: Self-pay

## 2021-12-17 NOTE — Telephone Encounter (Signed)
  Follow up Call-     12/16/2021   10:41 AM 07/16/2021    2:44 PM  Call back number  Post procedure Call Back phone  # [phone]  Permission to leave phone message Yes Yes     Patient questions:  Do you have a fever, pain , or abdominal swelling? No. Pain Score  0 *  Have you tolerated food without any problems? Yes.    Have you been able to return to your normal activities? Yes.    Do you have any questions about your discharge instructions: Diet   No. Medications  No. Follow up visit  No.  Do you have questions or concerns about your Care? No.  Actions: * If pain score is 4 or above: No action needed, pain <4.

## 2021-12-25 ENCOUNTER — Ambulatory Visit (INDEPENDENT_AMBULATORY_CARE_PROVIDER_SITE_OTHER): Payer: Self-pay | Admitting: Cardiovascular Disease

## 2021-12-25 ENCOUNTER — Encounter: Payer: Self-pay | Admitting: Cardiovascular Disease

## 2021-12-25 VITALS — BP 124/80 | HR 58 | Ht 62.0 in | Wt 136.0 lb

## 2021-12-25 DIAGNOSIS — E785 Hyperlipidemia, unspecified: Secondary | ICD-10-CM

## 2021-12-25 DIAGNOSIS — I10 Essential (primary) hypertension: Secondary | ICD-10-CM

## 2021-12-25 MED ORDER — AMITRIPTYLINE HCL 25 MG PO TABS: 25.0000 mg | ORAL_TABLET | Freq: Every day | ORAL | 3 refills | Status: DC

## 2021-12-25 MED ORDER — LISINOPRIL-HYDROCHLOROTHIAZIDE 20-25 MG PO TABS: 1.0000 | ORAL_TABLET | Freq: Every day | ORAL | 3 refills | Status: DC

## 2021-12-25 NOTE — Patient Instructions (Signed)
Medication Instructions:  Your physician recommends that you continue on your current medications as directed. Please refer to the Current Medication list given to you today.  *If you need a refill on your cardiac medications before your next appointment, please call your pharmacy*  Lab Work: If you have labs (blood work) drawn today and your tests are completely normal, you will receive your results only by: [place] (if you have MyChart) OR A paper copy in the mail If you have any lab test that is abnormal or we need to change your treatment, we will call you to review the results.  Testing/Procedures: Cardiac CT scanning for calcium score, (CAT scanning), is a noninvasive, special x-ray that produces cross-sectional images of the body using x-rays and a computer. CT scans help physicians diagnose and treat medical conditions. For some CT exams, a contrast material is used to enhance visibility in the area of the body being studied. CT scans provide greater clarity and reveal more details than regular x-ray exams.  Follow-Up: At [facility], you and your health needs are our priority.  As part of our continuing mission to provide you with exceptional heart care, we have created designated Provider Care Teams.  These Care Teams include your primary Cardiologist (physician) and Advanced Practice Providers (APPs -  Physician Assistants and Nurse Practitioners) who all work together to provide you with the care you need, when you need it.  We recommend signing up for the patient portal called "MyChart".  Sign up information is provided on this After Visit Summary.  MyChart is used to connect with patients for Virtual Visits (Telemedicine).  Patients are able to view lab/test results, encounter notes, upcoming appointments, etc.  Non-urgent messages can be sent to your provider as well.   To learn more about what you can do with MyChart, go to [email or web address].    Your next  appointment:   12 month(s)  The format for your next appointment:   In Person  Provider:   Jenkins Rouge, MD {   Important Information About Sugar

## 2021-12-30 ENCOUNTER — Encounter: Payer: Self-pay | Admitting: Gastroenterology

## 2022-01-16 ENCOUNTER — Other Ambulatory Visit (HOSPITAL_BASED_OUTPATIENT_CLINIC_OR_DEPARTMENT_OTHER): Payer: Self-pay

## 2022-01-27 ENCOUNTER — Telehealth: Payer: Self-pay | Admitting: Cardiovascular Disease

## 2022-01-27 NOTE — Telephone Encounter (Signed)
Unable to get in touch with pharmacist. Left message to call back.

## 2022-01-27 NOTE — Telephone Encounter (Signed)
*  STAT* If patient is at the pharmacy, call can be transferred to refill team.   1. Which medications need to be refilled? (please list name of each medication and dose if known)   amitriptyline (ELAVIL) 25 MG tablet  lisinopril-hydrochlorothiazide (ZESTORETIC) 20-25 MG tablet  2. Which pharmacy/location (including street and city if local pharmacy) is medication to be sent to?  [facility] [id number] - [place], [place]  3. Do they need a 30 day or 90 day supply?   Caller stated patient wants to get a year's supply of these medications as the patient [place] the [place].

## 2022-02-03 NOTE — Telephone Encounter (Signed)
Sent fax since no answer at pharmacy with orders to okay to give year supply.

## 2022-02-16 ENCOUNTER — Ambulatory Visit (HOSPITAL_BASED_OUTPATIENT_CLINIC_OR_DEPARTMENT_OTHER)
Admission: RE | Admit: 2022-02-16 | Discharge: 2022-02-16 | Disposition: A | Discharge status: Home / Self Care | Payer: Self-pay | Source: Ambulatory Visit | Attending: Cardiovascular Disease | Admitting: Cardiovascular Disease

## 2022-02-16 DIAGNOSIS — I10 Essential (primary) hypertension: Secondary | ICD-10-CM | POA: Insufficient documentation

## 2022-02-16 DIAGNOSIS — E785 Hyperlipidemia, unspecified: Secondary | ICD-10-CM | POA: Insufficient documentation

## 2022-02-17 ENCOUNTER — Telehealth: Payer: Self-pay | Admitting: Cardiovascular Disease

## 2022-02-17 NOTE — Telephone Encounter (Signed)
[name] [name] with [facility] calling with a call report.

## 2022-02-17 NOTE — Telephone Encounter (Signed)
Spoke with Opal Sidles and [facility] calling to report over-read impression on patient's calcium score CT scan. Wanted to make Dr. Johnsie Cancel aware of the following: IMPRESSION: New 8 x 3 mm nodule is noted in right middle lobe. Non-contrast chest CT at 6-12 months is recommended.  Results are in patient's chart.

## 2022-02-23 NOTE — Telephone Encounter (Signed)
Tried to call patient, no answer and no voicemail.

## 2022-02-25 NOTE — Telephone Encounter (Signed)
Tried to call again. No answer and not able to leave message. Called patient's daughter [facility]) emergency contact,  left message to call back.

## 2022-03-03 NOTE — Telephone Encounter (Signed)
After several attempts to contact patient. Will mail letter to patient, requesting for her to call the office for her results.

## 2022-03-17 ENCOUNTER — Telehealth: Payer: Self-pay

## 2022-03-17 DIAGNOSIS — R918 Other nonspecific abnormal finding of lung field: Secondary | ICD-10-CM

## 2022-03-17 NOTE — Telephone Encounter (Signed)
Patient came by the office after receiving her letter to call the office for her results. Dialed up interpreter on wheels to explain to patient what her CT calcium score results showed. Informed patient of her cardiac results and over-read results.  Per Dr. Johnsie Cancel, Tiny bit of calcium in just one artery mild aortic dilatation would repeat scan in 3 years  Over-read-  New 8 x 3 mm nodule is noted in right middle lobe. Non-contrast chest CT at 6-12 months is recommended.  Per Dr. Johnsie Cancel, make sure patient has follow-up CT done and refer to pulmonary.   Placed ordered for CT and referral to pulmonary. Patient verbalized understanding of test and scheduled follow up CT for March and will see pulmonology in a few weeks.

## 2022-03-30 ENCOUNTER — Encounter: Payer: Self-pay | Admitting: Pulmonary Disease

## 2022-03-30 ENCOUNTER — Ambulatory Visit: Payer: 59 | Admitting: Pulmonary Disease

## 2022-03-30 VITALS — BP 114/70 | HR 71 | Temp 98.0°F | Ht 62.0 in | Wt 137.2 lb

## 2022-03-30 DIAGNOSIS — Z789 Other specified health status: Secondary | ICD-10-CM | POA: Diagnosis not present

## 2022-03-30 DIAGNOSIS — R911 Solitary pulmonary nodule: Secondary | ICD-10-CM

## 2022-03-30 MED ORDER — ALBUTEROL SULFATE HFA 108 (90 BASE) MCG/ACT IN AERS: 2.0000 | INHALATION_SPRAY | Freq: Four times a day (QID) | RESPIRATORY_TRACT | 6 refills | Status: AC | PRN

## 2022-03-30 NOTE — Progress Notes (Signed)
Synopsis: Referred in October 2023 for pulmonary nodule by Josue Hector, MD  Subjective:   PATIENT ID: [name] [name] GENDER: female DOB: [birth year], MRN: [id number]  Chief Complaint  Patient presents with   Follow-up   Consult    This is a 56 year old female, non-smoker, past medical history of diabetes, hypertension, hyperlipidemia.  Patient was referred after having a coronary CT which found a incidental right middle lobe 8 mm by 3 mm lung nodule.  Patient is a lifelong non-smoker.  This was found incidentally.  She is anxious.  No family history of malignancy.  She has not herself had any malignancies.  She has not had any jobs with significant dust exposures.  Has not lived in a city with significant small corporate air pollution.,  Works in a store.  No significant secondhand smoke exposure.    Past Medical History:  Diagnosis Date   Acid reflux    Acute UTI 05/19/2018   Chest pain    09/15/2017 - normal stress echo   Chronic migraine w/o aura w/o status migrainosus, not intractable 08/10/2017   Diabetes mellitus without complication (HCC)    Dysplasia of cervix, low grade (CIN 1) 10/30/2015   09/2015- LSIL 10/2015- CIN 1 09/2016- NIL- no cotest done 04/2018- NIL, +HPV 06/2019- LSIL, HPV POS 07/2019- CIN 1   Endometriosis    Essential hypertension, benign 07/25/2012   Gastritis    Hyperlipidemia    Hypertension    Migraine    OSA (obstructive sleep apnea) 12/29/2018   does not use CPAP   Plantar fasciitis of right foot 10/05/2013   Varicose veins      Family History  Problem Relation Age of Onset   Hypertension Mother    Thyroid disease Mother    Stroke Father    Hypertension Father    Diabetes Father    Heart attack Father    Colon cancer Neg Hx    Esophageal cancer Neg Hx    Pancreatic cancer Neg Hx    Stomach cancer Neg Hx    Liver disease Neg Hx    Rectal cancer Neg Hx      Past Surgical History:  Procedure Laterality Date   CESAREAN SECTION     She  had 5 C-Sections   COLONOSCOPY      Social History   Socioeconomic History   Marital status: Married    Spouse name: Not on file   Number of children: 5   Years of education: Not on file   Highest education level: Bachelor's degree (e.g., BA, AB, BS)  Occupational History   Occupation: Scientist, water quality  Tobacco Use   Smoking status: Never   Smokeless tobacco: Never  Vaping Use   Vaping Use: Never used  Substance and Sexual Activity   Alcohol use: No    Alcohol/week: 0.0 standard drinks of alcohol   Drug use: No   Sexual activity: Yes    Birth control/protection: Post-menopausal  Other Topics Concern   Not on file  Social History Narrative   Lives at home with her husband and children   Drinks 1 cup of decaf coffee daily, sometimes drinks soda, mostly water   Right handed   Social Determinants of Health   Financial Resource Strain: Not on file  Food Insecurity: No Food Insecurity (08/12/2021)   Hunger Vital Sign    Worried About Running Out of Food in the Last Year: Never true    Ran Out of Food in the Last Year: Never  true  Transportation Needs: No Transportation Needs (08/12/2021)   PRAPARE - Hydrologist (Medical): No    Lack of Transportation (Non-Medical): No  Physical Activity: Inactive (06/24/2017)   Exercise Vital Sign    Days of Exercise per Week: 0 days    Minutes of Exercise per Session: 0 min  Stress: Stress Concern Present (06/24/2017)   [place]    Feeling of Stress : Rather much  Social Connections: Socially Integrated (06/24/2017)   Social Connection and Isolation Panel [NHANES]    Frequency of Communication with Friends and Family: More than three times a week    Frequency of Social Gatherings with Friends and Family: More than three times a week    Attends Religious Services: More than 4 times per year    Active Member of [employer] or Organizations: Yes    Attends Arts development officer: More than 4 times per year    Marital Status: Married  Human resources officer Violence: Not At Risk (06/24/2017)   Humiliation, Afraid, Rape, and Kick questionnaire    Fear of Current or Ex-Partner: No    Emotionally Abused: No    Physically Abused: No    Sexually Abused: No     No Known Allergies   Outpatient Medications Prior to Visit  Medication Sig Dispense Refill   amitriptyline (ELAVIL) 25 MG tablet Take 1 tablet (25 mg total) by mouth at bedtime. 90 tablet 3   aspirin EC 81 MG tablet Take 1 tablet (81 mg total) by mouth daily. (Patient taking differently: Take 81 mg by mouth daily as needed.) 90 tablet 3   atorvastatin (LIPITOR) 20 MG tablet TAKE ONE TABLET BY MOUTH AT BEDTINE 90 tablet 3   esomeprazole (NEXIUM) 40 MG capsule Take 1 capsule (40 mg total) by mouth daily. 30 capsule 3   lisinopril-hydrochlorothiazide (ZESTORETIC) 20-25 MG tablet Take 1 tablet by mouth daily. 90 tablet 3   metFORMIN (GLUCOPHAGE) 500 MG tablet Take 1 tablet (500 mg total) by mouth 2 (two) times daily with a meal. 180 tablet 3   Multiple Vitamin (MULTIVITAMIN ADULT PO) Take 1 tablet by mouth daily.     ondansetron (ZOFRAN) 4 MG tablet Take 1 tablet (4 mg total) by mouth daily as needed for nausea or vomiting. (Patient not taking: Reported on 12/16/2021) 30 tablet 0   No facility-administered medications prior to visit.    Review of Systems  Constitutional:  Negative for chills, fever, malaise/fatigue and weight loss.  HENT:  Negative for hearing loss, sore throat and tinnitus.   Eyes:  Negative for blurred vision and double vision.  Respiratory:  Negative for cough, hemoptysis, sputum production, shortness of breath, wheezing and stridor.   Cardiovascular:  Negative for chest pain, palpitations, orthopnea, leg swelling and PND.  Gastrointestinal:  Negative for abdominal pain, constipation, diarrhea, heartburn, nausea and vomiting.  Genitourinary:  Negative for dysuria, hematuria  and urgency.  Musculoskeletal:  Negative for joint pain and myalgias.  Skin:  Negative for itching and rash.  Neurological:  Negative for dizziness, tingling, weakness and headaches.  Endo/Heme/Allergies:  Negative for environmental allergies. Does not bruise/bleed easily.  Psychiatric/Behavioral:  Negative for depression. The patient is not nervous/anxious and does not have insomnia.   All other systems reviewed and are negative.    Objective:  Physical Exam Vitals reviewed.  Constitutional:      General: She is not in acute distress.    Appearance: She  is well-developed.  HENT:     Head: Normocephalic and atraumatic.  Eyes:     General: No scleral icterus.    Conjunctiva/sclera: Conjunctivae normal.     Pupils: Pupils are equal, round, and reactive to light.  Neck:     Vascular: No JVD.     Trachea: No tracheal deviation.  Cardiovascular:     Rate and Rhythm: Normal rate and regular rhythm.     Heart sounds: Normal heart sounds. No murmur heard. Pulmonary:     Effort: Pulmonary effort is normal. No tachypnea, accessory muscle usage or respiratory distress.     Breath sounds: Normal breath sounds. No stridor. No wheezing, rhonchi or rales.  Abdominal:     General: Bowel sounds are normal. There is no distension.     Palpations: Abdomen is soft.     Tenderness: There is no abdominal tenderness.  Musculoskeletal:        General: No tenderness.     Cervical back: Neck supple.  Lymphadenopathy:     Cervical: No cervical adenopathy.  Skin:    General: Skin is warm and dry.     Capillary Refill: Capillary refill takes less than 2 seconds.     Findings: No rash.  Neurological:     Mental Status: She is alert and oriented to person, place, and time.  Psychiatric:        Behavior: Behavior normal.      Vitals:   03/30/22 0923  BP: 114/70  Pulse: 71  Temp: 98 F (36.7 C)  SpO2: 100%  Weight: 137 lb 3.2 oz (62.2 kg)  Height: '5\' 2"'$  (1.575 m)   100% on RA BMI  Readings from Last 3 Encounters:  03/30/22 25.09 kg/m  12/25/21 24.87 kg/m  12/16/21 24.33 kg/m   Wt Readings from Last 3 Encounters:  03/30/22 137 lb 3.2 oz (62.2 kg)  12/25/21 136 lb (61.7 kg)  12/16/21 133 lb (60.3 kg)     CBC    Component Value Date/Time   WBC 4.1 09/17/2020 1009   RBC 5.04 09/17/2020 1009   HGB 14.0 09/17/2020 1009   HGB 13.7 02/14/2018 1017   HCT 41.6 09/17/2020 1009   HCT 41.4 02/14/2018 1017   PLT 215.0 09/17/2020 1009   PLT 280 02/14/2018 1017   MCV 82.4 09/17/2020 1009   MCV 84 02/14/2018 1017   MCH 27.8 11/17/2018 1920   MCHC 33.7 09/17/2020 1009   RDW 14.2 09/17/2020 1009   RDW 13.3 02/14/2018 1017   LYMPHSABS 1.6 09/17/2020 1009   LYMPHSABS 1.6 02/14/2018 1017   MONOABS 0.4 09/17/2020 1009   EOSABS 0.2 09/17/2020 1009   EOSABS 0.1 02/14/2018 1017   BASOSABS 0.0 09/17/2020 1009   BASOSABS 0.0 02/14/2018 1017     Chest Imaging: September 2023 coronary CT imaging: Incidental 8 mm right middle lobe pulmonary nodule The patient's images have been independently reviewed by me.    Pulmonary Functions Testing Results:     No data to display          FeNO:   Pathology:   Echocardiogram:   Heart Catheterization:     Assessment & Plan:     ICD-10-CM   1. Lung nodule  R91.1 CT Super D Chest Wo Contrast    2. Non-smoker  Z78.9       Discussion:  This is a 56 year old female, found to have incidental right middle lobe 8 mm pulmonary nodule she is a lifelong non-smoker.  Plan: We talked about the  risks of development of malignancy and small nodules. We talked about the various options that we have for evaluating lung nodules.  I think that she needs short-term follow-up with a repeat CT scan in 6 months. Based on the behavior of the nodule whether or not it gets larger or changes in size or morphology we would talk about next steps. Patient is agreeable to this plan. RTC in 6 months after follow-up CT imaging is  complete.  I have let patient's primary care provider Dr. Mitchel Honour know the plan. We appreciate consultation from PCP and cardiology services.    Current Outpatient Medications:    albuterol (VENTOLIN HFA) 108 (90 Base) MCG/ACT inhaler, Inhale 2 puffs into the lungs every 6 (six) hours as needed for wheezing or shortness of breath., Disp: 8 g, Rfl: 6   amitriptyline (ELAVIL) 25 MG tablet, Take 1 tablet (25 mg total) by mouth at bedtime., Disp: 90 tablet, Rfl: 3   aspirin EC 81 MG tablet, Take 1 tablet (81 mg total) by mouth daily. (Patient taking differently: Take 81 mg by mouth daily as needed.), Disp: 90 tablet, Rfl: 3   atorvastatin (LIPITOR) 20 MG tablet, TAKE ONE TABLET BY MOUTH AT BEDTINE, Disp: 90 tablet, Rfl: 3   esomeprazole (NEXIUM) 40 MG capsule, Take 1 capsule (40 mg total) by mouth daily., Disp: 30 capsule, Rfl: 3   lisinopril-hydrochlorothiazide (ZESTORETIC) 20-25 MG tablet, Take 1 tablet by mouth daily., Disp: 90 tablet, Rfl: 3   metFORMIN (GLUCOPHAGE) 500 MG tablet, Take 1 tablet (500 mg total) by mouth 2 (two) times daily with a meal., Disp: 180 tablet, Rfl: 3   Multiple Vitamin (MULTIVITAMIN ADULT PO), Take 1 tablet by mouth daily., Disp: , Rfl:    ondansetron (ZOFRAN) 4 MG tablet, Take 1 tablet (4 mg total) by mouth daily as needed for nausea or vomiting. (Patient not taking: Reported on 12/16/2021), Disp: 30 tablet, Rfl: 0    Garner Nash, DO [facility] 03/30/2022 12:14 PM

## 2022-03-30 NOTE — Patient Instructions (Addendum)
Thank you for visiting Dr. Valeta Harms at [facility]. Today we recommend the following:  Orders Placed This Encounter  Procedures   CT Super D Chest Wo Contrast   See [place] after your CT Complete   Return in about 6 months (around 09/29/2022) for w/  Dr. Valeta Harms.    Please do your part to reduce the spread of COVID-19.

## 2022-05-27 ENCOUNTER — Encounter (HOSPITAL_COMMUNITY): Payer: Self-pay | Admitting: *Deleted

## 2022-05-27 ENCOUNTER — Ambulatory Visit (HOSPITAL_COMMUNITY)
Admission: EM | Admit: 2022-05-27 | Discharge: 2022-05-27 | Disposition: A | Discharge status: Home / Self Care | Payer: 59 | Attending: Nurse Practitioner | Admitting: Nurse Practitioner

## 2022-05-27 DIAGNOSIS — R300 Dysuria: Secondary | ICD-10-CM | POA: Diagnosis not present

## 2022-05-27 DIAGNOSIS — R82998 Other abnormal findings in urine: Secondary | ICD-10-CM | POA: Insufficient documentation

## 2022-05-27 LAB — POCT URINALYSIS DIPSTICK, ED / UC
Bilirubin Urine: NEGATIVE
Glucose, UA: NEGATIVE mg/dL
Hgb urine dipstick: NEGATIVE
Ketones, ur: NEGATIVE mg/dL
Nitrite: POSITIVE — AB
Protein, ur: NEGATIVE mg/dL
Specific Gravity, Urine: 1.02 (ref 1.005–1.030)
Urobilinogen, UA: 0.2 mg/dL (ref 0.0–1.0)
pH: 6.5 (ref 5.0–8.0)

## 2022-05-27 LAB — CBG MONITORING, ED: Glucose-Capillary: 88 mg/dL (ref 70–99)

## 2022-05-27 MED ORDER — NITROFURANTOIN MONOHYD MACRO 100 MG PO CAPS: 100.0000 mg | ORAL_CAPSULE | Freq: Two times a day (BID) | ORAL | 0 refills | Status: DC

## 2022-05-27 NOTE — ED Provider Notes (Signed)
Center    CSN: [id number] Arrival date & time: 05/27/22  1018      History   Chief Complaint Chief Complaint  Patient presents with   Dysuria    HPI [name] [name] is a 56 y.o. female.   HPI  She is in today for a 2 day history of dysuria with frequency and abdominal pain. She has a used otc AZO . She does not feel like this has been effective. She denies recent UTI. She is unsure of her CBG. She has not been to her PCP in the last year.  Past Medical History:  Diagnosis Date   Acid reflux    Acute UTI 05/19/2018   Chest pain    09/15/2017 - normal stress echo   Chronic migraine w/o aura w/o status migrainosus, not intractable 08/10/2017   Diabetes mellitus without complication (HCC)    Dysplasia of cervix, low grade (CIN 1) 10/30/2015   09/2015- LSIL 10/2015- CIN 1 09/2016- NIL- no cotest done 04/2018- NIL, +HPV 06/2019- LSIL, HPV POS 07/2019- CIN 1   Endometriosis    Essential hypertension, benign 07/25/2012   Gastritis    Hyperlipidemia    Hypertension    Migraine    OSA (obstructive sleep apnea) 12/29/2018   does not use CPAP   Plantar fasciitis of right foot 10/05/2013   Varicose veins     Patient Active Problem List   Diagnosis Date Noted   Epigastric pain 05/19/2021   Prediabetes 01/14/2021   Hyperlipidemia 12/29/2018   FHx: early MI 12/29/2018   OSA (obstructive sleep apnea) 12/29/2018   Depression 12/29/2018   Chronic migraine w/o aura w/o status migrainosus, not intractable 08/10/2017   Dysplasia of cervix, low grade (CIN 1) 10/30/2015   Essential hypertension, benign 07/25/2012   Varicose veins 07/07/2011    Past Surgical History:  Procedure Laterality Date   CESAREAN SECTION     She had 5 C-Sections   COLONOSCOPY      OB History     Gravida  5   Para  5   Term  5   Preterm      AB      Living  5      SAB      IAB      Ectopic      Multiple      Live Births               Home Medications    Prior  to Admission medications   Medication Sig Start Date End Date Taking? Authorizing Provider  amitriptyline (ELAVIL) 25 MG tablet Take 1 tablet (25 mg total) by mouth at bedtime. 12/25/21  Yes Josue Hector, MD  atorvastatin (LIPITOR) 20 MG tablet TAKE ONE TABLET BY MOUTH AT BEDTINE 05/20/21  Yes Sagardia, Ines Bloomer, MD  lisinopril-hydrochlorothiazide (ZESTORETIC) 20-25 MG tablet Take 1 tablet by mouth daily. 12/25/21  Yes Josue Hector, MD  metFORMIN (GLUCOPHAGE) 500 MG tablet Take 1 tablet (500 mg total) by mouth 2 (two) times daily with a meal. 09/18/20  Yes Sagardia, Ines Bloomer, MD  Multiple Vitamin (MULTIVITAMIN ADULT PO) Take 1 tablet by mouth daily.   Yes [provider]  nitrofurantoin, macrocrystal-monohydrate, (MACROBID) 100 MG capsule Take 1 capsule (100 mg total) by mouth 2 (two) times daily. 05/27/22  Yes Vevelyn Francois, NP  albuterol (VENTOLIN HFA) 108 (90 Base) MCG/ACT inhaler Inhale 2 puffs into the lungs every 6 (six) hours as needed for wheezing  or shortness of breath. 03/30/22   Garner Nash, DO  aspirin EC 81 MG tablet Take 1 tablet (81 mg total) by mouth daily. Patient taking differently: Take 81 mg by mouth daily as needed. 12/08/19   Daleen Squibb, MD  esomeprazole (NEXIUM) 40 MG capsule Take 1 capsule (40 mg total) by mouth daily. 06/20/21   Mauri Pole, MD  ondansetron (ZOFRAN) 4 MG tablet Take 1 tablet (4 mg total) by mouth daily as needed for nausea or vomiting. Patient not taking: Reported on 12/16/2021 06/20/21   Mauri Pole, MD    Family History Family History  Problem Relation Age of Onset   Hypertension Mother    Thyroid disease Mother    Stroke Father    Hypertension Father    Diabetes Father    Heart attack Father    Colon cancer Neg Hx    Esophageal cancer Neg Hx    Pancreatic cancer Neg Hx    Stomach cancer Neg Hx    Liver disease Neg Hx    Rectal cancer Neg Hx     Social History Social History   Tobacco Use    Smoking status: Never   Smokeless tobacco: Never  Vaping Use   Vaping Use: Never used  Substance Use Topics   Alcohol use: No    Alcohol/week: 0.0 standard drinks of alcohol   Drug use: No     Allergies   Patient has no known allergies.   Review of Systems Review of Systems   Physical Exam Triage Vital Signs ED Triage Vitals  Enc Vitals Group     BP 05/27/22 1302 128/83     Pulse Rate 05/27/22 1302 65     Resp 05/27/22 1302 18     Temp 05/27/22 1302 98.1 F (36.7 C)     Temp Source 05/27/22 1302 Oral     SpO2 05/27/22 1302 100 %     Weight --      Height --      Head Circumference --      Peak Flow --      Pain Score 05/27/22 1305 6     Pain Loc --      Pain Edu? --      Excl. in [place]? --    No data found.  Updated Vital Signs BP 128/83   Pulse 65   Temp 98.1 F (36.7 C) (Oral)   Resp 18   LMP 05/12/2012   SpO2 100%   Visual Acuity Right Eye Distance:   Left Eye Distance:   Bilateral Distance:    Right Eye Near:   Left Eye Near:    Bilateral Near:     Physical Exam Constitutional:      General: She is not in acute distress. HENT:     Head: Normocephalic.  Cardiovascular:     Rate and Rhythm: Normal rate.  Pulmonary:     Effort: Pulmonary effort is normal.  Musculoskeletal:        General: Normal range of motion.  Skin:    General: Skin is warm.     Capillary Refill: Capillary refill takes less than 2 seconds.  Neurological:     General: No focal deficit present.     Mental Status: She is alert and oriented to person, place, and time.  Psychiatric:        Mood and Affect: Mood normal.      UC Treatments / Results  Labs (all labs ordered  are listed, but only abnormal results are displayed) Labs Reviewed  POCT URINALYSIS DIPSTICK, ED / UC - Abnormal; Notable for the following components:      Result Value   Nitrite POSITIVE (*)    Leukocytes,Ua TRACE (*)    All other components within normal limits  URINE CULTURE  CBG MONITORING,  ED    EKG   Radiology No results found.  Procedures Procedures (including critical care time)  Medications Ordered in UC Medications - No data to display  Initial Impression / Assessment and Plan / UC Course  I have reviewed the triage vital signs and the nursing notes.  Pertinent labs & imaging results that were available during my care of the patient were reviewed by me and considered in my medical decision making (see chart for details).     dysuria Final Clinical Impressions(s) / UC Diagnoses   Final diagnoses:  Dysuria  Leukocytes in urine     Discharge Instructions      Your urinalysis is positive for leukocytes. Urine culture pending this confirms the type of bacteria and if the treatment is effective. Due to your symptoms and history you have been started on a treatment of Macrobid twice a day for 5 days.  Encourage completion of your treatment even when symptoms improve.  Discussed resistance with antibiotic if over used.   Encourage increasing hydration with water and how to tell when this is achieved Add cranberry juice 100% 8 -16 ozs daily until symptoms improve Good hygiene and voiding when the urge presents  Maintain blood glucose level from 70-100.     ED Prescriptions     Medication Sig Dispense Auth. Provider   nitrofurantoin, macrocrystal-monohydrate, (MACROBID) 100 MG capsule Take 1 capsule (100 mg total) by mouth 2 (two) times daily. 10 capsule Vevelyn Francois, NP      PDMP not reviewed this encounter.   Vevelyn Francois, NP 05/27/22 1400

## 2022-05-27 NOTE — ED Triage Notes (Signed)
C/O dysuria, bladder discomfort, polyuria x 2 days. Unsure if fevers. Denies flank pain. Has been taking AZO.

## 2022-05-27 NOTE — Discharge Instructions (Addendum)
Your urinalysis is positive for leukocytes. Urine culture pending this confirms the type of bacteria and if the treatment is effective. Due to your symptoms and history you have been started on a treatment of Macrobid twice a day for 5 days.  Encourage completion of your treatment even when symptoms improve.  Discussed resistance with antibiotic if over used.   Encourage increasing hydration with water and how to tell when this is achieved Add cranberry juice 100% 8 -16 ozs daily until symptoms improve Good hygiene and voiding when the urge presents  Maintain blood glucose level from 70-100.

## 2022-05-28 LAB — URINE CULTURE: Culture: NO GROWTH

## 2022-06-11 ENCOUNTER — Ambulatory Visit: Payer: 59 | Admitting: Emergency Medicine

## 2022-06-11 ENCOUNTER — Encounter: Payer: Self-pay | Admitting: Emergency Medicine

## 2022-06-11 VITALS — BP 120/74 | HR 64 | Temp 98.0°F | Ht 62.0 in | Wt 137.2 lb

## 2022-06-11 DIAGNOSIS — R300 Dysuria: Secondary | ICD-10-CM

## 2022-06-11 DIAGNOSIS — I10 Essential (primary) hypertension: Secondary | ICD-10-CM | POA: Diagnosis not present

## 2022-06-11 DIAGNOSIS — M549 Dorsalgia, unspecified: Secondary | ICD-10-CM

## 2022-06-11 DIAGNOSIS — R7303 Prediabetes: Secondary | ICD-10-CM

## 2022-06-11 DIAGNOSIS — R1031 Right lower quadrant pain: Secondary | ICD-10-CM

## 2022-06-11 DIAGNOSIS — H5713 Ocular pain, bilateral: Secondary | ICD-10-CM

## 2022-06-11 DIAGNOSIS — R1032 Left lower quadrant pain: Secondary | ICD-10-CM | POA: Diagnosis not present

## 2022-06-11 DIAGNOSIS — E785 Hyperlipidemia, unspecified: Secondary | ICD-10-CM | POA: Diagnosis not present

## 2022-06-11 LAB — URINALYSIS
Bilirubin Urine: NEGATIVE
Hgb urine dipstick: NEGATIVE
Ketones, ur: NEGATIVE
Leukocytes,Ua: NEGATIVE
Nitrite: NEGATIVE
Specific Gravity, Urine: 1.02 (ref 1.000–1.030)
Total Protein, Urine: NEGATIVE
Urine Glucose: NEGATIVE
Urobilinogen, UA: 0.2 (ref 0.0–1.0)
pH: 7 (ref 5.0–8.0)

## 2022-06-11 LAB — CBC WITH DIFFERENTIAL/PLATELET
Basophils Absolute: 0 10*3/uL (ref 0.0–0.1)
Basophils Relative: 0.3 % (ref 0.0–3.0)
Eosinophils Absolute: 0.1 10*3/uL (ref 0.0–0.7)
Eosinophils Relative: 2.3 % (ref 0.0–5.0)
HCT: 41 % (ref 36.0–46.0)
Hemoglobin: 13.8 g/dL (ref 12.0–15.0)
Lymphocytes Relative: 34.1 % (ref 12.0–46.0)
Lymphs Abs: 1.5 10*3/uL (ref 0.7–4.0)
MCHC: 33.8 g/dL (ref 30.0–36.0)
MCV: 83.2 fl (ref 78.0–100.0)
Monocytes Absolute: 0.3 10*3/uL (ref 0.1–1.0)
Monocytes Relative: 7.8 % (ref 3.0–12.0)
Neutro Abs: 2.4 10*3/uL (ref 1.4–7.7)
Neutrophils Relative %: 55.5 % (ref 43.0–77.0)
Platelets: 243 10*3/uL (ref 150.0–400.0)
RBC: 4.92 Mil/uL (ref 3.87–5.11)
RDW: 13.3 % (ref 11.5–15.5)
WBC: 4.4 10*3/uL (ref 4.0–10.5)

## 2022-06-11 LAB — LIPID PANEL
Cholesterol: 155 mg/dL (ref 0–200)
HDL: 39.1 mg/dL (ref >39.00)
LDL Cholesterol: 76 mg/dL (ref 0–99)
NonHDL: 115.87
Total CHOL/HDL Ratio: 4
Triglycerides: 197 mg/dL — ABNORMAL HIGH (ref 0.0–149.0)
VLDL: 39.4 mg/dL (ref 0.0–40.0)

## 2022-06-11 LAB — COMPREHENSIVE METABOLIC PANEL
ALT: 16 U/L (ref 0–35)
AST: 18 U/L (ref 0–37)
Albumin: 4.2 g/dL (ref 3.5–5.2)
Alkaline Phosphatase: 95 U/L (ref 39–117)
BUN: 12 mg/dL (ref 6–23)
CO2: 30 mEq/L (ref 19–32)
Calcium: 9.8 mg/dL (ref 8.4–10.5)
Chloride: 106 mEq/L (ref 96–112)
Creatinine, Ser: 0.55 mg/dL (ref 0.40–1.20)
GFR: 103.3 mL/min (ref >60.00)
Glucose, Bld: 115 mg/dL — ABNORMAL HIGH (ref 70–99)
Potassium: 3.7 mEq/L (ref 3.5–5.1)
Sodium: 141 mEq/L (ref 135–145)
Total Bilirubin: 0.5 mg/dL (ref 0.2–1.2)
Total Protein: 7.4 g/dL (ref 6.0–8.3)

## 2022-06-11 LAB — HEMOGLOBIN A1C: Hgb A1c MFr Bld: 5.6 % (ref 4.6–6.5)

## 2022-06-11 MED ORDER — MELOXICAM 15 MG PO TABS: 15.0000 mg | ORAL_TABLET | Freq: Every day | ORAL | 1 refills | Status: AC

## 2022-06-11 MED ORDER — CYCLOBENZAPRINE HCL 10 MG PO TABS: 10.0000 mg | ORAL_TABLET | Freq: Every day | ORAL | 0 refills | Status: DC

## 2022-06-11 NOTE — Assessment & Plan Note (Signed)
Chronic and affecting quality of life. Recommend heat pad to back after work Recommend to start Flexeril 10 mg at bedtime and meloxicam 50 mg daily for 10 days

## 2022-06-11 NOTE — Assessment & Plan Note (Addendum)
Diet and nutrition discussed.  Hemoglobin A1c done today.  Continue metformin 500 mg twice a day. 

## 2022-06-11 NOTE — Assessment & Plan Note (Signed)
Well-controlled hypertension.  Continue Zestoretic 20-25 mg daily. Metabolic panel done today to check electrolytes and kidney function test. BP Readings from Last 3 Encounters:  06/11/22 120/74  05/27/22 128/83  03/30/22 114/70

## 2022-06-11 NOTE — Patient Instructions (Signed)

## 2022-06-11 NOTE — Assessment & Plan Note (Signed)
Stable.  Diet and nutrition discussed. Continue atorvastatin 20 mg daily. The 10-year ASCVD risk score (Arnett DK, et al., 2019) is: 2.5%   Values used to calculate the score:     Age: 57 years     Sex: Female     Is Non-Hispanic African American: No     Diabetic: No     Tobacco smoker: No     Systolic Blood Pressure: 438 mmHg     Is BP treated: Yes     HDL Cholesterol: 37.2 mg/dL     Total Cholesterol: 151 mg/dL

## 2022-06-11 NOTE — Assessment & Plan Note (Signed)
Symptoms not typical of UTI Urinalysis and culture done today Recommend GYN evaluation.  Referral placed today.

## 2022-06-11 NOTE — Assessment & Plan Note (Addendum)
Clinical stable with benign abdominal examination Differential diagnosis discussed Patient needs gynecological evaluation.  Referral placed today. Will check urinalysis and blood work today. Diet and nutrition discussed Pain management discussed.  May take Tylenol as needed for pain

## 2022-06-11 NOTE — Progress Notes (Signed)
[name] [name] 57 y.o.   Chief Complaint  Patient presents with   Acute Visit    Back pain x 3 weeks upper back , shoulder area     HISTORY OF PRESENT ILLNESS: This is a 57 y.o. female complaining of urinary discomfort along with lower abdominal discomfort for for several weeks Also complaining of pain and discomfort to upper back muscles for the last 3 weeks Recently seen at urgent care center for possible UTI but no infection was found. Requesting referral to gynecologist Also history of chronic bilateral eye pain.  Needs ophthalmology referral.   HPI   Prior to Admission medications   Medication Sig Start Date End Date Taking? Authorizing Provider  albuterol (VENTOLIN HFA) 108 (90 Base) MCG/ACT inhaler Inhale 2 puffs into the lungs every 6 (six) hours as needed for wheezing or shortness of breath. 03/30/22  Yes Icard, Octavio Graves, DO  amitriptyline (ELAVIL) 25 MG tablet Take 1 tablet (25 mg total) by mouth at bedtime. 12/25/21  Yes Josue Hector, MD  aspirin EC 81 MG tablet Take 1 tablet (81 mg total) by mouth daily. Patient taking differently: Take 81 mg by mouth daily as needed. 12/08/19  Yes Jacelyn Pi, Lilia Argue, MD  atorvastatin (LIPITOR) 20 MG tablet TAKE ONE TABLET BY MOUTH AT BEDTINE 05/20/21  Yes Vikrant Pryce, Ines Bloomer, MD  esomeprazole (NEXIUM) 40 MG capsule Take 1 capsule (40 mg total) by mouth daily. 06/20/21  Yes Nandigam, Venia Minks, MD  lisinopril-hydrochlorothiazide (ZESTORETIC) 20-25 MG tablet Take 1 tablet by mouth daily. 12/25/21  Yes Josue Hector, MD  metFORMIN (GLUCOPHAGE) 500 MG tablet Take 1 tablet (500 mg total) by mouth 2 (two) times daily with a meal. 09/18/20  Yes Kristain Filo, Ines Bloomer, MD  Multiple Vitamin (MULTIVITAMIN ADULT PO) Take 1 tablet by mouth daily.   Yes [provider]  nitrofurantoin, macrocrystal-monohydrate, (MACROBID) 100 MG capsule Take 1 capsule (100 mg total) by mouth 2 (two) times daily. 05/27/22  Yes Vevelyn Francois, NP    No  Known Allergies  Patient Active Problem List   Diagnosis Date Noted   Prediabetes 01/14/2021   Hyperlipidemia 12/29/2018   FHx: early MI 12/29/2018   OSA (obstructive sleep apnea) 12/29/2018   Depression 12/29/2018   Chronic migraine w/o aura w/o status migrainosus, not intractable 08/10/2017   Dysplasia of cervix, low grade (CIN 1) 10/30/2015   Essential hypertension, benign 07/25/2012   Varicose veins 07/07/2011    Past Medical History:  Diagnosis Date   Acid reflux    Acute UTI 05/19/2018   Chest pain    09/15/2017 - normal stress echo   Chronic migraine w/o aura w/o status migrainosus, not intractable 08/10/2017   Diabetes mellitus without complication ([place])    Dysplasia of cervix, low grade (CIN 1) 10/30/2015   09/2015- LSIL 10/2015- CIN 1 09/2016- NIL- no cotest done 04/2018- NIL, +HPV 06/2019- LSIL, HPV POS 07/2019- CIN 1   Endometriosis    Essential hypertension, benign 07/25/2012   Gastritis    Hyperlipidemia    Hypertension    Migraine    OSA (obstructive sleep apnea) 12/29/2018   does not use CPAP   Plantar fasciitis of right foot 10/05/2013   Varicose veins     Past Surgical History:  Procedure Laterality Date   CESAREAN SECTION     She had 5 C-Sections   COLONOSCOPY      Social History   Socioeconomic History   Marital status: Married    Spouse  name: Not on file   Number of children: 5   Years of education: Not on file   Highest education level: Bachelor's degree (e.g., BA, AB, BS)  Occupational History   Occupation: Scientist, water quality  Tobacco Use   Smoking status: Never   Smokeless tobacco: Never  Vaping Use   Vaping Use: Never used  Substance and Sexual Activity   Alcohol use: No    Alcohol/week: 0.0 standard drinks of alcohol   Drug use: No   Sexual activity: Not on file  Other Topics Concern   Not on file  Social History Narrative   Lives at home with her husband and children   Drinks 1 cup of decaf coffee daily, sometimes drinks soda, mostly water    Right handed   Social Determinants of Health   Financial Resource Strain: Not on file  Food Insecurity: No Food Insecurity (08/12/2021)   Hunger Vital Sign    Worried About Running Out of Food in the Last Year: Never true    Ran Out of Food in the Last Year: Never true  Transportation Needs: No Transportation Needs (08/12/2021)   PRAPARE - Hydrologist (Medical): No    Lack of Transportation (Non-Medical): No  Physical Activity: Inactive (06/24/2017)   Exercise Vital Sign    Days of Exercise per Week: 0 days    Minutes of Exercise per Session: 0 min  Stress: Stress Concern Present (06/24/2017)   [place]    Feeling of Stress : Rather much  Social Connections: Socially Integrated (06/24/2017)   Social Connection and Isolation Panel [NHANES]    Frequency of Communication with Friends and Family: More than three times a week    Frequency of Social Gatherings with Friends and Family: More than three times a week    Attends Religious Services: More than 4 times per year    Active Member of [employer] or Organizations: Yes    Attends Music therapist: More than 4 times per year    Marital Status: Married  Human resources officer Violence: Not At Risk (06/24/2017)   Humiliation, Afraid, Rape, and Kick questionnaire    Fear of Current or Ex-Partner: No    Emotionally Abused: No    Physically Abused: No    Sexually Abused: No    Family History  Problem Relation Age of Onset   Hypertension Mother    Thyroid disease Mother    Stroke Father    Hypertension Father    Diabetes Father    Heart attack Father    Colon cancer Neg Hx    Esophageal cancer Neg Hx    Pancreatic cancer Neg Hx    Stomach cancer Neg Hx    Liver disease Neg Hx    Rectal cancer Neg Hx      Review of Systems  Constitutional: Negative.   HENT: Negative.  Negative for congestion and sore throat.   Respiratory:  Negative.  Negative for cough and shortness of breath.   Cardiovascular: Negative.  Negative for chest pain and palpitations.  Gastrointestinal:  Positive for abdominal pain. Negative for blood in stool, diarrhea, melena, nausea and vomiting.  Genitourinary:  Positive for dysuria. Negative for frequency and hematuria.  Musculoskeletal:  Positive for back pain.  Skin: Negative.  Negative for rash.  Neurological: Negative.  Negative for dizziness and headaches.  All other systems reviewed and are negative.  Today's Vitals   06/11/22 1041  BP: 120/74  Pulse: 64  Temp: 98 F (36.7 C)  TempSrc: Oral  SpO2: 99%  Weight: 137 lb 4 oz (62.3 kg)  Height: '5\' 2"'$  (1.575 m)   Body mass index is 25.1 kg/m.   Physical Exam Vitals reviewed.  Constitutional:      Appearance: Normal appearance.  HENT:     Head: Normocephalic.     Mouth/Throat:     Mouth: Mucous membranes are moist.     Pharynx: Oropharynx is clear.  Eyes:     Extraocular Movements: Extraocular movements intact.     Pupils: Pupils are equal, round, and reactive to light.  Cardiovascular:     Rate and Rhythm: Normal rate and regular rhythm.     Pulses: Normal pulses.     Heart sounds: Normal heart sounds.  Pulmonary:     Effort: Pulmonary effort is normal.     Breath sounds: Normal breath sounds.  Abdominal:     Palpations: Abdomen is soft.     Tenderness: There is no abdominal tenderness. There is no right CVA tenderness or left CVA tenderness.  Musculoskeletal:     Cervical back: No tenderness.     Right lower leg: No edema.     Left lower leg: No edema.     Comments: Muscle tenderness and spasm upper back  Lymphadenopathy:     Cervical: No cervical adenopathy.  Skin:    General: Skin is warm and dry.  Neurological:     General: No focal deficit present.     Mental Status: She is alert and oriented to person, place, and time.  Psychiatric:        Mood and Affect: Mood normal.        Behavior: Behavior normal.       ASSESSMENT & PLAN: A total of 45 minutes was spent with the patient and counseling/coordination of care regarding preparing for this visit, review of most recent office visit notes, review of multiple chronic medical conditions and their management, review of all medications, review of most recent blood work results, prognosis, need for GYN evaluation, documentation, and need for follow-up.  Problem List Items Addressed This Visit       Cardiovascular and Mediastinum   Essential hypertension, benign    Well-controlled hypertension.  Continue Zestoretic 20-25 mg daily. Metabolic panel done today to check electrolytes and kidney function test. BP Readings from Last 3 Encounters:  06/11/22 120/74  05/27/22 128/83  03/30/22 114/70          Other   Dyslipidemia    Stable.  Diet and nutrition discussed. Continue atorvastatin 20 mg daily. The 10-year ASCVD risk score (Arnett DK, et al., 2019) is: 2.5%   Values used to calculate the score:     Age: 37 years     Sex: Female     Is Non-Hispanic African American: No     Diabetic: No     Tobacco smoker: No     Systolic Blood Pressure: 749 mmHg     Is BP treated: Yes     HDL Cholesterol: 37.2 mg/dL     Total Cholesterol: 151 mg/dL       Relevant Orders   Lipid panel   Prediabetes    Diet and nutrition discussed.  Hemoglobin A1c done today. Continue metformin 500 mg twice a day      Relevant Orders   Hemoglobin A1c   Musculoskeletal back pain - Primary    Chronic and affecting quality of life. Recommend heat pad to  back after work Recommend to start Flexeril 10 mg at bedtime and meloxicam 50 mg daily for 10 days      Relevant Medications   cyclobenzaprine (FLEXERIL) 10 MG tablet   meloxicam (MOBIC) 15 MG tablet   Bilateral lower abdominal discomfort    Clinical stable with benign abdominal examination Differential diagnosis discussed Patient needs gynecological evaluation.  Referral placed today. Will check  urinalysis and blood work today. Diet and nutrition discussed Pain management discussed.  May take Tylenol as needed for pain      Relevant Orders   CBC with Differential/Platelet   Comprehensive metabolic panel   Ambulatory referral to Gynecology   Dysuria    Symptoms not typical of UTI Urinalysis and culture done today Recommend GYN evaluation.  Referral placed today.      Relevant Orders   Urinalysis   Urine Culture   Ambulatory referral to Gynecology   Eye pain, bilateral    Chronic and affecting quality of life.  Recommend ophthalmology evaluation.  Referral placed today.      Relevant Orders   Ambulatory referral to Ophthalmology   Patient Instructions  Mantenimiento de la salud en [place] Maintenance, Female Adoptar un estilo de vida saludable y recibir atencin preventiva son importantes para promover la salud y Musician. Consulte al mdico sobre: El esquema adecuado para hacerse pruebas y exmenes peridicos. Cosas que puede hacer por su cuenta para prevenir enfermedades y [place] sano. Qu debo saber sobre la dieta, el peso y el ejercicio? Consuma una dieta saludable  Consuma una dieta que incluya muchas verduras, frutas, productos lcteos con bajo contenido de [place] y Advertising account planner. No consuma muchos alimentos ricos en grasas slidas, azcares agregados o sodio. Mantenga un peso saludable El ndice de masa muscular [facility]) se [place] para identificar problemas de [place]. Proporciona una estimacin de la grasa corporal basndose en el peso y la altura. Su mdico puede ayudarle a Radiation protection practitioner [place] y a Scientist, forensic o Theatre manager un peso saludable. Haga ejercicio con regularidad Haga ejercicio con regularidad. Esta es una de las prcticas ms importantes que puede hacer por su salud. La [employer] de los adultos deben seguir estas pautas: Optometrist, al menos, 150 minutos de actividad fsica por semana. El ejercicio debe aumentar la frecuencia cardaca y Nature conservation officer  transpirar (ejercicio de intensidad moderada). Hacer ejercicios de fortalecimiento por lo [employer] por semana. Agregue esto a su plan de ejercicio de intensidad moderada. Pase menos tiempo sentada. Incluso la actividad fsica ligera puede ser beneficiosa. Controle sus niveles de colesterol y lpidos en la sangre Comience a realizarse anlisis de lpidos y Research officer, trade union en la sangre a los 52 aos y luego reptalos cada 5 aos. Hgase controlar los niveles de colesterol con mayor frecuencia si: Sus niveles de lpidos y colesterol son altos. Es mayor de 47 aos. Presenta un alto riesgo de padecer enfermedades cardacas. Qu debo saber sobre las pruebas de deteccin del cncer? Segn su historia clnica y sus antecedentes familiares, es posible que deba realizarse pruebas de deteccin del cncer en diferentes edades. Esto puede incluir pruebas de deteccin de lo siguiente: Cncer de mama. Cncer de cuello uterino. Cncer colorrectal. Cncer de piel. Cncer de pulmn. Qu debo saber sobre la enfermedad cardaca, la diabetes y la hipertensin arterial? Presin arterial y enfermedad cardaca La hipertensin arterial causa enfermedades cardacas y [place] el riesgo de accidente cerebrovascular. Es ms probable que esto se manifieste en las personas que tienen lecturas de presin arterial alta o tienen sobrepeso.  Hgase controlar la presin arterial: Cada 3 a 5 aos si tiene entre 18 y 49 aos. Todos los aos si es mayor de 40 aos. Diabetes Realcese exmenes de deteccin de la diabetes con regularidad. Este anlisis revisa el nivel de azcar en la sangre en [place]. Hgase las pruebas de deteccin: Cada tres aos despus de los 78 aos de edad si tiene un peso normal y un bajo riesgo de padecer diabetes. Con ms frecuencia y a partir de [place] edad inferior si tiene sobrepeso o un alto riesgo de padecer diabetes. Qu debo saber sobre la prevencin de infecciones? Hepatitis B Si tiene un  riesgo ms alto de contraer hepatitis B, debe someterse a un examen de deteccin de este virus. Hable con el mdico para averiguar si tiene riesgo de contraer la infeccin por hepatitis B. Hepatitis C Se recomienda el anlisis a: [employer] 1945 y 1965. Todas las personas que tengan un riesgo de haber contrado hepatitis C. Enfermedades de transmisin sexual (ETS) Hgase las pruebas de Programme researcher, broadcasting/film/video de ITS, incluidas la gonorrea y la clamidia, si: Es sexualmente activa y es menor de 27 aos. Es mayor de 9 aos, y Investment banker, operational informa que corre riesgo de tener este tipo de infecciones. La actividad sexual ha cambiado desde que le hicieron la ltima prueba de deteccin y tiene un riesgo mayor de Best boy clamidia o Radio broadcast assistant. Pregntele al mdico si usted tiene riesgo. Pregntele al mdico si usted tiene un alto riesgo de Museum/gallery curator VIH. El mdico tambin puede recomendarle un medicamento recetado para ayudar a evitar la infeccin por el VIH. Si elige tomar medicamentos para prevenir el VIH, primero debe [employer] de deteccin del VIH. Luego debe hacerse anlisis cada 3 meses mientras est tomando los medicamentos. Embarazo Si est por dejar de Librarian, academic (fase premenopusica) y usted puede quedar [place], busque asesoramiento antes de [place]. Tome de 400 a 800 microgramos (mcg) de cido [employer] si [place]. Pida mtodos de control de la natalidad (anticonceptivos) si desea evitar un embarazo no deseado. Osteoporosis y [place] La osteoporosis es una enfermedad en la que los huesos pierden los minerales y la fuerza por el avance de la edad. El resultado pueden ser fracturas en los [place]. Si tiene 79 aos o ms, o si est en riesgo de sufrir osteoporosis y fracturas, pregunte a su mdico si debe: Hacerse pruebas de deteccin de prdida sea. Tomar un suplemento de calcio o de vitamina D para reducir el riesgo de fracturas. Recibir terapia de  reemplazo hormonal (TRH) para tratar los sntomas de la menopausia. Siga estas indicaciones en su casa: Consumo de alcohol No beba alcohol si: Su mdico le indica no hacerlo. Est embarazada, puede estar embarazada o est tratando de [place]. Si bebe alcohol: Limite la cantidad que bebe a lo siguiente: De 0 a 1 bebida por da. Sepa cunta cantidad de alcohol hay en las bebidas que toma. En los [place], una medida equivale a una botella de cerveza de 12 oz (355 ml), un vaso de vino de 5 oz (148 ml) o un vaso de una bebida alcohlica de alta graduacin de 1 oz (44 ml). Estilo de vida No consuma ningn producto que contenga nicotina o tabaco. Estos productos incluyen cigarrillos, tabaco para Higher education careers adviser y aparatos de vapeo, como los Psychologist, sport and exercise. Si necesita ayuda para dejar de consumir estos productos, consulte al mdico. No consuma drogas. No comparta agujas. Solicite ayuda a su mdico si necesita apoyo  o informacin para abandonar las drogas. Indicaciones generales Realcese los estudios de rutina de la salud, dentales y de Public librarian. [place]. Infrmele a su mdico si: Se siente deprimida con frecuencia. Alguna vez ha sido vctima de [place] o no se siente seguro en su casa. Resumen Adoptar un estilo de vida saludable y recibir atencin preventiva son importantes para promover la salud y Musician. Siga las instrucciones del mdico acerca de una dieta saludable, el ejercicio y la realizacin de pruebas o exmenes para Engineer, building services. Siga las instrucciones del mdico con respecto al control del colesterol y la presin arterial. Esta informacin no tiene Marine scientist el consejo del mdico. Asegrese de hacerle al mdico cualquier pregunta que tenga. Document Revised: 10/31/2020 Document Reviewed: 10/31/2020 Elsevier Patient Education  [place], MD [facility]

## 2022-06-11 NOTE — Assessment & Plan Note (Signed)
Chronic and affecting quality of life.  Recommend ophthalmology evaluation.  Referral placed today.

## 2022-06-12 LAB — URINE CULTURE: Result:: NO GROWTH

## 2022-07-24 ENCOUNTER — Telehealth: Payer: Self-pay

## 2022-07-24 DIAGNOSIS — M549 Dorsalgia, unspecified: Secondary | ICD-10-CM

## 2022-07-24 DIAGNOSIS — E785 Hyperlipidemia, unspecified: Secondary | ICD-10-CM

## 2022-07-24 NOTE — Telephone Encounter (Signed)
Pt has called and stated she needs a paper rx for her medication for her headache and cholesterol. Pt states she needs the paper rx cause her pharmacy does not have it and she is asking for a call back from the nurse. Pt also states she needs meds for a cough as she is coughing. Pt has be persistent on Dr. Mitchel Honour nurse giving her a call back as she needs her meds today. I was able to inform the pt that it is now 4:49pm and we close a 5, Dr. Mitchel Honour does not work on Fridays and the nurse may or may be able to contact her before 5pm.  amitriptyline (ELAVIL) 25 MG tablet   atorvastatin (LIPITOR) 20 MG tablet  lisinopril-hydrochlorothiazide (ZESTORETIC) 20-25 MG tablet

## 2022-07-27 MED ORDER — LISINOPRIL-HYDROCHLOROTHIAZIDE 20-25 MG PO TABS: 1.0000 | ORAL_TABLET | Freq: Every day | ORAL | 3 refills | Status: DC

## 2022-07-27 MED ORDER — CYCLOBENZAPRINE HCL 10 MG PO TABS: 10.0000 mg | ORAL_TABLET | Freq: Every day | ORAL | 0 refills | Status: DC

## 2022-07-27 MED ORDER — AMITRIPTYLINE HCL 25 MG PO TABS: 25.0000 mg | ORAL_TABLET | Freq: Every day | ORAL | 3 refills | Status: DC

## 2022-07-27 MED ORDER — ATORVASTATIN CALCIUM 20 MG PO TABS: ORAL_TABLET | ORAL | 3 refills | Status: AC

## 2022-07-27 NOTE — Telephone Encounter (Signed)
Patient wants refill of her Flexeril. I informed her that she will have to contact her cardiologist to get a refill of her lisinopril- HCTZ and her amitriptyline.

## 2022-07-27 NOTE — Telephone Encounter (Signed)
New prescriptions for medications requested sent to pharmacy of record today.  Thanks.

## 2022-08-17 ENCOUNTER — Ambulatory Visit (HOSPITAL_BASED_OUTPATIENT_CLINIC_OR_DEPARTMENT_OTHER): Admission: RE | Admit: 2022-08-17 | Payer: 59 | Source: Ambulatory Visit

## 2022-08-24 ENCOUNTER — Ambulatory Visit (HOSPITAL_BASED_OUTPATIENT_CLINIC_OR_DEPARTMENT_OTHER): Admission: RE | Admit: 2022-08-24 | Payer: 59 | Source: Ambulatory Visit

## 2022-09-12 DIAGNOSIS — Z1231 Encounter for screening mammogram for malignant neoplasm of breast: Secondary | ICD-10-CM | POA: Diagnosis not present

## 2022-09-21 ENCOUNTER — Telehealth: Payer: Self-pay | Admitting: Pulmonary Disease

## 2022-09-21 NOTE — Telephone Encounter (Signed)
This PT has two CT's set up and one needs to be canceled. Another provider has ordered a CT. Please call her (Mariam) to advise what action Dr. Eulah Citizen like taken. They are willing to send results to both Dr's no matter the decision.   Mariam @ [facility]  Direct: [phone]

## 2022-09-21 NOTE — Telephone Encounter (Signed)
1 is for a super D ordered by Dr Tonia Brooms the other is a ct chest wo by Dr Eden Emms

## 2022-09-22 NOTE — Telephone Encounter (Signed)
Sir,  Please advise on what CT needs to be done and which one needs to be canceled sir  Thank you

## 2022-09-24 NOTE — Telephone Encounter (Signed)
Noted  

## 2022-09-28 ENCOUNTER — Ambulatory Visit
Admission: RE | Admit: 2022-09-28 | Discharge: 2022-09-28 | Disposition: A | Discharge status: Home / Self Care | Payer: 59 | Source: Ambulatory Visit | Attending: Cardiovascular Disease | Admitting: Cardiovascular Disease

## 2022-09-28 DIAGNOSIS — R918 Other nonspecific abnormal finding of lung field: Secondary | ICD-10-CM

## 2022-09-28 DIAGNOSIS — R911 Solitary pulmonary nodule: Secondary | ICD-10-CM | POA: Diagnosis not present

## 2022-09-28 DIAGNOSIS — I700 Atherosclerosis of aorta: Secondary | ICD-10-CM | POA: Diagnosis not present

## 2022-09-30 ENCOUNTER — Other Ambulatory Visit: Payer: 59

## 2022-10-02 ENCOUNTER — Ambulatory Visit: Payer: 59 | Admitting: Acute Care

## 2022-10-02 DIAGNOSIS — Z5948 Other specified lack of adequate food: Secondary | ICD-10-CM | POA: Diagnosis not present

## 2022-10-02 DIAGNOSIS — Z5986 Financial insecurity: Secondary | ICD-10-CM | POA: Diagnosis not present

## 2022-10-02 DIAGNOSIS — I10 Essential (primary) hypertension: Secondary | ICD-10-CM | POA: Diagnosis not present

## 2022-10-02 DIAGNOSIS — E785 Hyperlipidemia, unspecified: Secondary | ICD-10-CM | POA: Diagnosis not present

## 2022-10-02 DIAGNOSIS — G43909 Migraine, unspecified, not intractable, without status migrainosus: Secondary | ICD-10-CM | POA: Diagnosis not present

## 2022-10-02 DIAGNOSIS — R0600 Dyspnea, unspecified: Secondary | ICD-10-CM | POA: Diagnosis not present

## 2022-10-07 DIAGNOSIS — M25512 Pain in left shoulder: Secondary | ICD-10-CM | POA: Insufficient documentation

## 2022-10-08 DIAGNOSIS — M7542 Impingement syndrome of left shoulder: Secondary | ICD-10-CM | POA: Diagnosis not present

## 2022-10-08 DIAGNOSIS — S46011A Strain of muscle(s) and tendon(s) of the rotator cuff of right shoulder, initial encounter: Secondary | ICD-10-CM | POA: Diagnosis not present

## 2022-10-18 ENCOUNTER — Emergency Department (HOSPITAL_COMMUNITY)
Admission: EM | Admit: 2022-10-18 | Discharge: 2022-10-18 | Disposition: A | Discharge status: Home / Self Care | Payer: 59 | Attending: Emergency Medicine | Admitting: Emergency Medicine

## 2022-10-18 ENCOUNTER — Other Ambulatory Visit: Payer: Self-pay

## 2022-10-18 ENCOUNTER — Emergency Department (HOSPITAL_COMMUNITY): Payer: 59

## 2022-10-18 ENCOUNTER — Encounter (HOSPITAL_COMMUNITY): Payer: Self-pay

## 2022-10-18 DIAGNOSIS — E876 Hypokalemia: Secondary | ICD-10-CM | POA: Diagnosis not present

## 2022-10-18 DIAGNOSIS — R519 Headache, unspecified: Secondary | ICD-10-CM | POA: Insufficient documentation

## 2022-10-18 DIAGNOSIS — Z7982 Long term (current) use of aspirin: Secondary | ICD-10-CM | POA: Diagnosis not present

## 2022-10-18 DIAGNOSIS — R079 Chest pain, unspecified: Secondary | ICD-10-CM | POA: Diagnosis not present

## 2022-10-18 DIAGNOSIS — I10 Essential (primary) hypertension: Secondary | ICD-10-CM | POA: Diagnosis not present

## 2022-10-18 DIAGNOSIS — R0789 Other chest pain: Secondary | ICD-10-CM | POA: Diagnosis not present

## 2022-10-18 DIAGNOSIS — J90 Pleural effusion, not elsewhere classified: Secondary | ICD-10-CM | POA: Diagnosis not present

## 2022-10-18 LAB — CBC
HCT: 41.8 % (ref 36.0–46.0)
Hemoglobin: 13.8 g/dL (ref 12.0–15.0)
MCH: 27.2 pg (ref 26.0–34.0)
MCHC: 33 g/dL (ref 30.0–36.0)
MCV: 82.4 fL (ref 80.0–100.0)
Platelets: 255 10*3/uL (ref 150–400)
RBC: 5.07 MIL/uL (ref 3.87–5.11)
RDW: 13.2 % (ref 11.5–15.5)
WBC: 7.2 10*3/uL (ref 4.0–10.5)
nRBC: 0 % (ref 0.0–0.2)

## 2022-10-18 LAB — BASIC METABOLIC PANEL
Anion gap: 10 (ref 5–15)
BUN: 15 mg/dL (ref 6–20)
CO2: 22 mmol/L (ref 22–32)
Calcium: 9.2 mg/dL (ref 8.9–10.3)
Chloride: 103 mmol/L (ref 98–111)
Creatinine, Ser: 0.59 mg/dL (ref 0.44–1.00)
GFR, Estimated: >60 mL/min (ref >60)
Glucose, Bld: 118 mg/dL — ABNORMAL HIGH (ref 70–99)
Potassium: 3.4 mmol/L — ABNORMAL LOW (ref 3.5–5.1)
Sodium: 135 mmol/L (ref 135–145)

## 2022-10-18 LAB — TROPONIN I (HIGH SENSITIVITY)
Troponin I (High Sensitivity): 3 ng/L (ref <18)
Troponin I (High Sensitivity): 4 ng/L (ref <18)

## 2022-10-18 MED ORDER — SUCRALFATE 1 G PO TABS: 1.0000 g | ORAL_TABLET | Freq: Three times a day (TID) | ORAL | 1 refills | Status: AC

## 2022-10-18 NOTE — ED Provider Notes (Signed)
I independently interpreted patient's labs and second troponin is negative, head CT without acute findings.  Patient's lab work and evaluation is overall reassuring.  Went to speak with the patient and her symptoms sound most likely related to gastritis which she has had an endoscopy in the past and has this history.  Reassured the patient there was no acute cardiac findings today.  Will add sucralfate to use as needed for discomfort.  Patient is thankful that her heart looks normal.  At this time no indication for admission or further testing   Gwyneth Sprout, MD 10/18/22 [phone]

## 2022-10-18 NOTE — ED Provider Notes (Signed)
[place] AT [facility] Provider Note   CSN: [id number] Arrival date & time: 10/18/22  0865     History  Chief Complaint  Patient presents with   Chest Pain    [name] [name] is a 57 y.o. female.  Patient presents to the emergency department for evaluation of chest pain.  Patient reports that she woke up with chest pain and is very concerned about possible heart attack.  Patient reports that multiple family members had early heart disease.  Patient also feels like her head is pounding.  This was present prior to she received nitroglycerin.  She is concerned about possible stroke.  Patient did get 3 nitroglycerin and aspirin prior to arrival.  She is currently pain-free.       Home Medications Prior to Admission medications   Medication Sig Start Date End Date Taking? Authorizing Provider  albuterol (VENTOLIN HFA) 108 (90 Base) MCG/ACT inhaler Inhale 2 puffs into the lungs every 6 (six) hours as needed for wheezing or shortness of breath. 03/30/22  Yes Icard, Rachel Bo, DO  amitriptyline (ELAVIL) 25 MG tablet Take 1 tablet (25 mg total) by mouth at bedtime. Patient taking differently: Take 25 mg by mouth at bedtime as needed (for pain). 07/27/22  Yes SagardiaEilleen Kempf, MD  aspirin EC 81 MG tablet Take 1 tablet (81 mg total) by mouth daily. Patient taking differently: Take 81 mg by mouth daily as needed for mild pain. 12/08/19  Yes Lezlie Lye, Irma M, MD  atorvastatin (LIPITOR) 20 MG tablet TAKE ONE TABLET BY MOUTH AT BEDTINE Patient taking differently: Take 20 mg by mouth at bedtime. 07/27/22  Yes Sagardia, Eilleen Kempf, MD  lisinopril-hydrochlorothiazide (ZESTORETIC) 20-25 MG tablet Take 1 tablet by mouth daily. 07/27/22  Yes Sagardia, Eilleen Kempf, MD  metFORMIN (GLUCOPHAGE) 500 MG tablet Take 1 tablet (500 mg total) by mouth 2 (two) times daily with a meal. 09/18/20  Yes Sagardia, Eilleen Kempf, MD  Multiple Vitamin (MULTIVITAMIN ADULT PO) Take 1  tablet by mouth daily.   Yes [provider]      Allergies    Patient has no known allergies.    Review of Systems   Review of Systems  Physical Exam Updated Vital Signs BP 112/72   Pulse 61   Temp 98.6 F (37 C) (Oral)   Resp 15   Ht 5\' 2"  (1.575 m)   Wt 61.2 kg   LMP 05/12/2012   SpO2 100%   BMI 24.69 kg/m  Physical Exam Vitals and nursing note reviewed.  Constitutional:      General: She is not in acute distress.    Appearance: She is well-developed.  HENT:     Head: Normocephalic and atraumatic.     Mouth/Throat:     Mouth: Mucous membranes are moist.  Eyes:     General: Vision grossly intact. Gaze aligned appropriately.     Extraocular Movements: Extraocular movements intact.     Conjunctiva/sclera: Conjunctivae normal.  Cardiovascular:     Rate and Rhythm: Normal rate and regular rhythm.     Pulses: Normal pulses.     Heart sounds: Normal heart sounds, S1 normal and S2 normal. No murmur heard.    No friction rub. No gallop.  Pulmonary:     Effort: Pulmonary effort is normal. No respiratory distress.     Breath sounds: Normal breath sounds.  Abdominal:     General: Bowel sounds are normal.     Palpations: Abdomen is  soft.     Tenderness: There is no abdominal tenderness. There is no guarding or rebound.     Hernia: No hernia is present.  Musculoskeletal:        General: No swelling.     Cervical back: Full passive range of motion without pain, normal range of motion and neck supple. No spinous process tenderness or muscular tenderness. Normal range of motion.     Right lower leg: No edema.     Left lower leg: No edema.  Skin:    General: Skin is warm and dry.     Capillary Refill: Capillary refill takes less than 2 seconds.     Findings: No ecchymosis, erythema, rash or wound.  Neurological:     General: No focal deficit present.     Mental Status: She is alert and oriented to person, place, and time.     GCS: GCS eye subscore is 4. GCS  verbal subscore is 5. GCS motor subscore is 6.     Cranial Nerves: Cranial nerves 2-12 are intact.     Sensory: Sensation is intact.     Motor: Motor function is intact.     Coordination: Coordination is intact.  Psychiatric:        Attention and Perception: Attention normal.        Mood and Affect: Mood normal.        Speech: Speech normal.        Behavior: Behavior normal.     ED Results / Procedures / Treatments   Labs (all labs ordered are listed, but only abnormal results are displayed) Labs Reviewed  BASIC METABOLIC PANEL - Abnormal; Notable for the following components:      Result Value   Potassium 3.4 (*)    Glucose, Bld 118 (*)    All other components within normal limits  CBC  TROPONIN I (HIGH SENSITIVITY)  TROPONIN I (HIGH SENSITIVITY)    EKG EKG Interpretation  Date/Time:  Sunday Oct 18 2022 05:46:33 EDT Ventricular Rate:  76 PR Interval:  162 QRS Duration: 99 QT Interval:  370 QTC Calculation: 416 R Axis:   6 Text Interpretation: Sinus rhythm Normal ECG Confirmed by Gilda Crease ([id number]) on 10/18/2022 5:48:53 AM  Radiology DG Chest Port 1 View  Result Date: 10/18/2022 CLINICAL DATA:  57 year old female with history of chest pain. EXAM: PORTABLE CHEST 1 VIEW COMPARISON:  Chest x-ray 11/17/2018. FINDINGS: Lung volumes are low. Bibasilar opacities favored to predominantly reflect subsegmental atelectasis, although underlying airspace consolidation is difficult to exclude. Small left pleural effusion. No definite right pleural effusion. No pneumothorax. No pulmonary nodule or mass noted. Pulmonary vasculature and the cardiomediastinal silhouette are within normal limits. IMPRESSION: 1. Low lung volumes with bibasilar areas of probable subsegmental atelectasis and small left pleural effusion. Electronically Signed   By: Trudie Reed M.D.   On: 10/18/2022 06:11    Procedures Procedures    Medications Ordered in ED Medications - No data to  display  ED Course/ Medical Decision Making/ A&P                             Medical Decision Making Amount and/or Complexity of Data Reviewed Labs: ordered. Radiology: ordered.   Differential Diagnosis considered includes, but not limited to: STEMI; NSTEMI; myocarditis; pericarditis; pulmonary embolism; aortic dissection; pneumothorax; pneumonia; gastritis; musculoskeletal pain  Patient presents with chest pain.  Pain began while at rest, woke her from sleep.  She is currently pain-free.  Patient reports extensive family history of early heart disease.  She has been seen by cardiology multiple times over the years.  She has had myocardial perfusion in 2017, echo stress test in 2019, CT cardiac scoring in 2021 and 2023.  It is not felt that she has any significant cardiac disease.  Will obtain troponins, if negative, will be appropriate for discharge and outpatient follow-up with cardiology.  Will sign to the oncoming ER physician.  Patient very concerned about the possibility of stroke.  She does not have any stroke symptoms.  She is experiencing a headache which was likely made worse by nitroglycerin.  Will perform CT.        Final Clinical Impression(s) / ED Diagnoses Final diagnoses:  Chest pain, unspecified type    Rx / DC Orders ED Discharge Orders     None         Gilda Crease, MD 10/18/22 [phone]

## 2022-10-18 NOTE — ED Triage Notes (Addendum)
Patient arrives via ems from home secondary to waking up from chest pain. Hx MI and GERD. Received 325 mg aspirin and 3 round nitro prior arrival. Patient denies any chest pain at this time. NSR on the monitor.

## 2022-10-18 NOTE — Discharge Instructions (Addendum)
All your heart tests today were normal.  Your x-ray and CAT scan of your brain were normal.  No signs of kidney problems, heart attacks or anemia.  The pain you are experiencing is most likely related to your gastritis.  In addition to your current medication avoid aspirin products, you are also given a prescription for something to help with your stomach that you can take as needed.  Follow-up with your doctor and the gastroenterologist if you continue to have issues.

## 2022-10-21 ENCOUNTER — Telehealth: Payer: Self-pay | Admitting: *Deleted

## 2022-10-21 ENCOUNTER — Encounter: Payer: Self-pay | Admitting: *Deleted

## 2022-10-21 NOTE — Transitions of Care (Post Inpatient/ED Visit) (Signed)
   10/21/2022  Name: [name] [name] MRN: [id number] DOB: 07-24-1965  Today's TOC FU Call Status: Today's TOC FU Call Status:: Unsuccessul Call (1st Attempt) Unsuccessful Call (1st Attempt) Date: 10/21/22  ED EMMI Red Alert notification on 10/20/22 from ED visit 10/18/22- EMMI call placed 10/19/22: "No scheduled follow up"  Attempted to reach the patient regarding the most recent ED visit; left HIPAA compliant voice message using interpreter services, requesting call back  call facilitated by [employer] [phone]); Regan Rakers ID# [id number]   Follow Up Plan: Additional outreach attempts will be made to reach the patient to complete the Transitions of Care (Post ED visit) call.   Caryl Pina, RN, BSN, CCRN Alumnus RN CM Care Coordination/ Transition of Care- [facility] Care Management [phone]: direct office

## 2022-10-23 ENCOUNTER — Encounter: Payer: Self-pay | Admitting: Acute Care

## 2022-10-30 ENCOUNTER — Telehealth: Payer: Self-pay | Admitting: *Deleted

## 2022-10-30 ENCOUNTER — Encounter: Payer: Self-pay | Admitting: *Deleted

## 2022-10-30 NOTE — Transitions of Care (Post Inpatient/ED Visit) (Signed)
   10/30/2022  Name: [name] [name] MRN: [id number] DOB: 1965-07-16  Today's TOC FU Call Status: Today's TOC FU Call Status:: Unsuccessful Call (2nd Attempt) Unsuccessful Call (2nd Attempt) Date: 10/30/22  ED EMMI Red Alert notification on 10/20/22 from ED visit 10/18/22- EMMI call placed 10/19/22: "No scheduled follow up"   Attempted to reach the patient regarding the most recent ED visit; Call placed using [facility], [phone]; [place], ID # [phone]; interpreter left voice message explaining that we would re-attempt call again next week  Follow Up Plan: Additional outreach attempts will be made to reach the patient to complete the Transitions of Care (Post ED visit) call.   Caryl Pina, RN, BSN, CCRN Alumnus RN CM Care Coordination/ Transition of Care- [facility] Care Management [phone]: direct office

## 2022-11-05 ENCOUNTER — Encounter: Payer: Self-pay | Admitting: *Deleted

## 2022-11-05 ENCOUNTER — Telehealth: Payer: Self-pay | Admitting: *Deleted

## 2022-11-05 NOTE — Transitions of Care (Post Inpatient/ED Visit) (Signed)
   11/05/2022  Name: IVANNA WIEMER MRN: [id number] DOB: 06-Aug-1965  Today's TOC FU Call Status: Today's TOC FU Call Status:: Unsuccessful Call (3rd Attempt) Unsuccessful Call (3rd Attempt) Date: 11/05/22  ED EMMI Red Alert notification on 10/20/22 from ED visit 10/18/22- EMMI call placed 10/19/22: "No scheduled follow up"   Attempted to reach the patient regarding the most recent ED visit; Call facilitated by [employer] interpreter [employer] [phone]; Spoke with "Justo" ID # [phone]; left voice message providing PCP office number  Follow Up Plan: No further outreach attempts will be made at this time. We have been unable to contact the patient.  Caryl Pina, RN, BSN, CCRN Alumnus RN CM Care Coordination/ Transition of Care- [facility] Care Management [phone]: direct office

## 2023-01-04 ENCOUNTER — Encounter (HOSPITAL_BASED_OUTPATIENT_CLINIC_OR_DEPARTMENT_OTHER): Payer: 59 | Admitting: Obstetrics & Gynecology

## 2023-03-01 ENCOUNTER — Encounter (HOSPITAL_BASED_OUTPATIENT_CLINIC_OR_DEPARTMENT_OTHER): Payer: 59 | Admitting: Obstetrics & Gynecology

## 2023-03-01 ENCOUNTER — Telehealth (HOSPITAL_BASED_OUTPATIENT_CLINIC_OR_DEPARTMENT_OTHER): Payer: Self-pay | Admitting: *Deleted

## 2023-03-15 NOTE — Telephone Encounter (Signed)
No encounter needed

## 2023-08-03 ENCOUNTER — Other Ambulatory Visit: Payer: Self-pay | Admitting: Emergency Medicine

## 2023-08-24 ENCOUNTER — Encounter: Payer: Self-pay | Admitting: Emergency Medicine

## 2024-02-28 ENCOUNTER — Ambulatory Visit: Payer: Self-pay

## 2024-02-28 NOTE — Telephone Encounter (Signed)
 FYI Only or Action Required?: Action required by provider: request for appointment.  Patient was last seen in primary care on 06/11/2022 by Purcell Emil Schanz, MD.  Called Nurse Triage reporting Facial Pain.  Symptoms began several weeks ago.  Interventions attempted: Rest, hydration, or home remedies.  Symptoms are: gradually worsening.  Triage Disposition: See HCP Within 4 Hours (Or PCP Triage)  Patient/caregiver understands and will follow disposition?: Yes   Copied from CRM [id number]. Topic: Clinical - Red Word Triage >> Feb 28, 2024  8:54 AM [name] [name] wrote: Red Word that prompted transfer to Nurse Triage: Pain in right side of head and face Reason for Disposition  [1] Swollen area of face AND [2] is painful to touch  Answer Assessment - Initial Assessment Questions 1. ONSET: When did the pain start? (e.g., minutes, hours, days)      2 week 2. ONSET: Does the pain come and go, or has it been constant since it started? (e.g., constant, intermittent, fleeting)     Comes and goes 3. SEVERITY: How bad is the pain? (Scale 1-10; mild, moderate or severe)     10 4. LOCATION: Where does it hurt?      Eye, ear and mouth 5. RASH: Is there any redness, rash, or swelling of your face?     no 6. FEVER: Do you have a fever? If Yes, ask: What is it, how was it measured, and when did it start?      no 7. OTHER SYMPTOMS: Do you have any other symptoms? (e.g., fever, toothache, nasal discharge, nasal congestion, clicking sensation in jaw joint)     no 8. PREGNANCY: Is there any chance you are pregnant? When was your last menstrual period?     no  Protocols used: Face Pain-A-AH

## 2024-02-29 ENCOUNTER — Ambulatory Visit: Admitting: Emergency Medicine

## 2024-02-29 ENCOUNTER — Encounter: Payer: Self-pay | Admitting: Emergency Medicine

## 2024-02-29 VITALS — BP 128/78 | HR 81 | Temp 98.3°F | Ht 62.0 in | Wt 150.0 lb

## 2024-02-29 DIAGNOSIS — R519 Headache, unspecified: Secondary | ICD-10-CM | POA: Diagnosis not present

## 2024-02-29 DIAGNOSIS — G500 Trigeminal neuralgia: Secondary | ICD-10-CM | POA: Insufficient documentation

## 2024-02-29 LAB — SEDIMENTATION RATE: Sed Rate: 17 mm/h (ref 0–30)

## 2024-02-29 LAB — CBC WITH DIFFERENTIAL/PLATELET
Basophils Absolute: 0 K/uL (ref 0.0–0.1)
Basophils Relative: 0.4 % (ref 0.0–3.0)
Eosinophils Absolute: 0.2 K/uL (ref 0.0–0.7)
Eosinophils Relative: 4.4 % (ref 0.0–5.0)
HCT: 41.2 % (ref 36.0–46.0)
Hemoglobin: 13.9 g/dL (ref 12.0–15.0)
Lymphocytes Relative: 28.4 % (ref 12.0–46.0)
Lymphs Abs: 1.5 K/uL (ref 0.7–4.0)
MCHC: 33.8 g/dL (ref 30.0–36.0)
MCV: 82.9 fl (ref 78.0–100.0)
Monocytes Absolute: 0.5 K/uL (ref 0.1–1.0)
Monocytes Relative: 9.4 % (ref 3.0–12.0)
Neutro Abs: 3 K/uL (ref 1.4–7.7)
Neutrophils Relative %: 57.4 % (ref 43.0–77.0)
Platelets: 253 K/uL (ref 150.0–400.0)
RBC: 4.97 Mil/uL (ref 3.87–5.11)
RDW: 13.4 % (ref 11.5–15.5)
WBC: 5.2 K/uL (ref 4.0–10.5)

## 2024-02-29 LAB — COMPREHENSIVE METABOLIC PANEL WITH GFR
ALT: 20 U/L (ref 0–35)
AST: 21 U/L (ref 0–37)
Albumin: 4.3 g/dL (ref 3.5–5.2)
Alkaline Phosphatase: 101 U/L (ref 39–117)
BUN: 16 mg/dL (ref 6–23)
CO2: 27 meq/L (ref 19–32)
Calcium: 9.7 mg/dL (ref 8.4–10.5)
Chloride: 104 meq/L (ref 96–112)
Creatinine, Ser: 0.73 mg/dL (ref 0.40–1.20)
GFR: 91.57 mL/min (ref >60.00)
Glucose, Bld: 119 mg/dL — ABNORMAL HIGH (ref 70–99)
Potassium: 3.8 meq/L (ref 3.5–5.1)
Sodium: 139 meq/L (ref 135–145)
Total Bilirubin: 0.3 mg/dL (ref 0.2–1.2)
Total Protein: 7.4 g/dL (ref 6.0–8.3)

## 2024-02-29 LAB — VITAMIN D 25 HYDROXY (VIT D DEFICIENCY, FRACTURES): VITD: 16.06 ng/mL — ABNORMAL LOW (ref 30.00–100.00)

## 2024-02-29 LAB — VITAMIN B12: Vitamin B-12: 360 pg/mL (ref 211–911)

## 2024-02-29 LAB — HEMOGLOBIN A1C: Hgb A1c MFr Bld: 6.2 % (ref 4.6–6.5)

## 2024-02-29 LAB — TSH: TSH: 2.22 u[IU]/mL (ref 0.35–5.50)

## 2024-02-29 MED ORDER — PREDNISONE 20 MG PO TABS
20.0000 mg | ORAL_TABLET | Freq: Every day | ORAL | 0 refills | Status: AC
Start: 2024-02-29 — End: 2024-03-05

## 2024-02-29 MED ORDER — HYDROCODONE-ACETAMINOPHEN 5-325 MG PO TABS
1.0000 | ORAL_TABLET | Freq: Four times a day (QID) | ORAL | 0 refills | Status: AC | PRN
Start: 2024-02-29 — End: ?

## 2024-02-29 NOTE — Assessment & Plan Note (Signed)
 Clinically stable.  No red flag signs or symptoms. Differential diagnosis discussed Doubt vasculitic process.  Not behaving like migraine. Clinical picture consistent with trigeminal neuralgia Unknown trigger or etiology Recommend blood work today Recommend prednisone  20 mg daily and Norco for moderate to severe pain as needed

## 2024-02-29 NOTE — Patient Instructions (Signed)
 Neuralgia del trigmino Trigeminal Neuralgia  La neuralgia del trigmino es un trastorno nervioso que causa dolor intenso en un lado de la cara. El dolor puede durar entre unos segundos y varios minutos, pero puede ocurrir cientos de Immunologist. Habitualmente, el dolor aparece en un lado de la cara. Los sntomas pueden presentarse [address], semanas o meses, y Clinical biochemist por meses o aos. El dolor puede volver y ser peor que antes. Cules son las causas? Esta afeccin puede ser causada por lo siguiente: Pernell o presin en un nervio de la cabeza que se llama nervio trigmino. Las siguientes acciones pueden desencadenar una crisis: Systems developer. Maquillarse. Lavarse, afeitarse o tocarse la cara. Cepillarse los dientes. Las rfagas de aire fro o caliente. Trastornos desmielinizantes primarios, como la esclerosis mltiple. Tumores. Qu incrementa el riesgo? Es ms probable que sufra esta afeccin si: Tiene entre 50 y 72 aos de edad. Es mujer. Cules son los signos o sntomas? El sntoma principal de esta afeccin es un dolor intenso en la [place], los labios, los ojos, la [place], el cuero cabelludo, la frente y Recruitment consultant. Cmo se diagnostica? Esta afeccin se diagnostica mediante un examen fsico. Se puede realizar una exploracin por tomografa computarizada (TC) o una resonancia magntica (RM) para descartar la presencia de otros trastornos que pueden causar dolor facial. Cmo se trata? El tratamiento de esta afeccin puede incluir: Medidas para evitar las cosas que desencadenan los sntomas. Medicamentos recetados, [employer]. Procedimientos como ablacin, tratamiento trmico o radioterapia. Terapia cognitiva o conductual. Tratamientos complementarios, tales como: Ejercicio suave y regular o yoga. Meditacin. Aromaterapia. Acupuntura. Ciruga. Esta puede realizarse [employer] casos son graves, si otro tratamiento mdico no brinda alivio. El  tratamiento puede demorar hasta un mes en comenzar a Engineer, materials. Siga estas indicaciones en su casa: Control del dolor  Infrmese lo ms que pueda sobre cmo Runner, broadcasting/film/video. Pregntele al mdico si sera til recurrir a Dance movement psychotherapist. Considere hablar con un mdico especializado en salud mental sobre cmo lidiar con Chief Technology Officer. Considere la posibilidad de Advertising account planner en un grupo de apoyo para Chief Technology Officer. Indicaciones generales Use los medicamentos de venta libre y los recetados solamente como se lo haya indicado el mdico. Evite las cosas que desencadenan los sntomas. Puede ser de ayuda: Masticar del lado de la boca que no est afectado. Evitar tocarse el rostro. Evitar las rfagas de aire fro o caliente. Concurra a todas las visitas de seguimiento. Dnde buscar ms informacin Facial Pain Association (Asociacin del dolor facial): facepain.org Comunquese con un mdico si: Los medicamentos que toma no [employer]. Los medicamentos que toma para el tratamiento le causan efectos secundarios. Le aparecen sntomas nuevos que no se pueden explicar, por ejemplo: Visin doble. Debilidad o adormecimiento facial. Cambios en la audicin o el equilibrio. Se siente deprimida. Solicite ayuda de inmediato si: El dolor es intenso y no mejora. Tiene pensamientos suicidas. Si alguna vez siente que puede lastimarse o Physicist, medical a Economist, o tiene pensamientos de poner fin a su vida, busque ayuda de inmediato. Dirjase al servicio de urgencias ms cercano o: Comunquese con el servicio de emergencias de su localidad (911 en los [place]). Llame a una lnea de [place] para crisis suicidas, como la National Suicide Prevention Lifeline (Lnea Telefnica Nacional para la Prevencin del Suicidio) al [phone] o al 988 en los [place]. Esta lnea atiende las 24 horas del da en los [place]. Enve un mensaje de  texto a la lnea para casos de crisis al [phone] (en los [place] Resumen La neuralgia del trigmino es un trastorno nervioso que causa dolor intenso en un lado de la cara. El dolor puede durar de unos segundos a varios minutos. La causa de esta afeccin es el dao o la presin en un nervio de la cabeza que se llama trigmino. El tratamiento puede incluir evitar las cosas que desencadenan los sntomas, tomar medicamentos, o someterse a [place] o procedimientos. El tratamiento puede demorar hasta un mes en comenzar a Engineer, materials. Concurra a todas las visitas de seguimiento. Esta informacin no tiene Theme park manager el consejo del mdico. Asegrese de hacerle al mdico cualquier pregunta que tenga. Document Revised: 12/12/2020 Document Reviewed: 12/12/2020 Elsevier Patient Education  2025 [employer].

## 2024-02-29 NOTE — Assessment & Plan Note (Signed)
 Pain management discussed Recommend Tylenol  and Advil  for mild to moderate pain and Norco for moderate to severe pain Patient takes Elavil  25 mg at bedtime May need neurology evaluation Advised to contact the office if no better or worse during the next several days or weeks.

## 2024-02-29 NOTE — Progress Notes (Signed)
 [name] [name] 58 y.o.   Chief Complaint  Patient presents with   Facial Pain    Patient has been has been having right side facial pain, states its been going on for 2 weeks. Says its sharpe and throbbing, has been on and off.    HISTORY OF PRESENT ILLNESS: This is a 58 y.o. female complaining of sharp severe pain to right side of her face waxing and waning over the past 2-1/2 weeks Has history of migraine headaches but this feels different. Denies blurry vision, hearing problems, epistaxis, or any other associated symptoms.  Denies fever or chills.  No recent flulike symptoms. States pain radiates into ear, nose and roof of her mouth. No other complaints or medical concerns today.  HPI   Prior to Admission medications   Medication Sig Start Date End Date Taking? Authorizing Provider  albuterol  (VENTOLIN  HFA) 108 (90 Base) MCG/ACT inhaler Inhale 2 puffs into the lungs every 6 (six) hours as needed for wheezing or shortness of breath. 03/30/22  Yes Icard, Bradley L, DO  amitriptyline  (ELAVIL ) 25 MG tablet TAKE 1 TABLET BY MOUTH AT BEDTIME 08/03/23  Yes Sukhman Kocher, Emil Schanz, MD  aspirin  EC 81 MG tablet Take 1 tablet (81 mg total) by mouth daily. Patient taking differently: Take 81 mg by mouth daily as needed for mild pain (pain score 1-3). 12/08/19  Yes Melonie Colonel, Mikel HERO, MD  atorvastatin  (LIPITOR) 20 MG tablet TAKE ONE TABLET BY MOUTH AT BEDTINE Patient taking differently: Take 20 mg by mouth at bedtime. 07/27/22  Yes Mayla Biddy, Emil Schanz, MD  lisinopril -hydrochlorothiazide  (ZESTORETIC ) 20-25 MG tablet TAKE 1 TABLET BY MOUTH DAILY 08/03/23  Yes Joeangel Jeanpaul Jose, MD  metFORMIN  (GLUCOPHAGE ) 500 MG tablet Take 1 tablet (500 mg total) by mouth 2 (two) times daily with a meal. 09/18/20  Yes Markasia Carrol, Emil Schanz, MD  Multiple Vitamin (MULTIVITAMIN ADULT PO) Take 1 tablet by mouth daily.   Yes [provider]  sucralfate  (CARAFATE ) 1 g tablet Take 1 tablet (1 g total) by mouth  4 (four) times daily -  with meals and at bedtime. Take medication as needed for pain 10/18/22  Yes Doretha Folks, MD    No Known Allergies  Patient Active Problem List   Diagnosis Date Noted   Prediabetes 01/14/2021   Dyslipidemia 12/29/2018   OSA (obstructive sleep apnea) 12/29/2018   Depression 12/29/2018   Chronic migraine w/o aura w/o status migrainosus, not intractable 08/10/2017   Dysplasia of cervix, low grade (CIN 1) 10/30/2015   Essential hypertension, benign 07/25/2012   Varicose veins 07/07/2011    Past Medical History:  Diagnosis Date   Acid reflux    Acute UTI 05/19/2018   Chest pain    09/15/2017 - normal stress echo   Chronic migraine w/o aura w/o status migrainosus, not intractable 08/10/2017   Diabetes mellitus without complication (HCC)    Dysplasia of cervix, low grade (CIN 1) 10/30/2015   09/2015- LSIL 10/2015- CIN 1 09/2016- NIL- no cotest done 04/2018- NIL, +HPV 06/2019- LSIL, HPV POS 07/2019- CIN 1   Endometriosis    Essential hypertension, benign 07/25/2012   Gastritis    Hyperlipidemia    Hypertension    Migraine    OSA (obstructive sleep apnea) 12/29/2018   does not use CPAP   Plantar fasciitis of right foot 10/05/2013   Varicose veins     Past Surgical History:  Procedure Laterality Date   CESAREAN SECTION     She had 5 C-Sections  COLONOSCOPY      Social History   Socioeconomic History   Marital status: Married    Spouse name: Not on file   Number of children: 5   Years of education: Not on file   Highest education level: Bachelor's degree (e.g., BA, AB, BS)  Occupational History   Occupation: Conservation officer, nature  Tobacco Use   Smoking status: Never   Smokeless tobacco: Never  Vaping Use   Vaping status: Never Used  Substance and Sexual Activity   Alcohol use: No    Alcohol/week: 0.0 standard drinks of alcohol   Drug use: No   Sexual activity: Not on file  Other Topics Concern   Not on file  Social History Narrative   Lives at home  with her husband and children   Drinks 1 cup of decaf coffee daily, sometimes drinks soda, mostly water   Right handed   Social Drivers of Health   Financial Resource Strain: Not on file  Food Insecurity: No Food Insecurity (08/12/2021)   Hunger Vital Sign    Worried About Running Out of Food in the Last Year: Never true    Ran Out of Food in the Last Year: Never true  Transportation Needs: No Transportation Needs (08/12/2021)   PRAPARE - Administrator, Civil Service (Medical): No    Lack of Transportation (Non-Medical): No  Physical Activity: Inactive (06/24/2017)   Exercise Vital Sign    Days of Exercise per Week: 0 days    Minutes of Exercise per Session: 0 min  Stress: Stress Concern Present (06/24/2017)   [employer] of Occupational Health - Occupational Stress Questionnaire    Feeling of Stress : Rather much  Social Connections: Unknown (10/17/2021)   Received from [facility]   Social Network    Social Network: Not on file  Intimate Partner Violence: Unknown (09/08/2021)   Received from [facility]   HITS    Physically Hurt: Not on file    Insult or Talk Down To: Not on file    Threaten Physical Harm: Not on file    Scream or Curse: Not on file    Family History  Problem Relation Age of Onset   Hypertension Mother    Thyroid disease Mother    Stroke Father    Hypertension Father    Diabetes Father    Heart attack Father    Colon cancer Neg Hx    Esophageal cancer Neg Hx    Pancreatic cancer Neg Hx    Stomach cancer Neg Hx    Liver disease Neg Hx    Rectal cancer Neg Hx      Review of Systems  Constitutional: Negative.  Negative for chills and fever.  HENT: Negative.  Negative for congestion and sore throat.   Eyes:  Negative for blurred vision, double vision and redness.  Respiratory: Negative.  Negative for cough and shortness of breath.   Cardiovascular: Negative.  Negative for chest pain and palpitations.  Gastrointestinal:  Negative  for abdominal pain, diarrhea, nausea and vomiting.  Genitourinary: Negative.  Negative for dysuria and hematuria.  Skin: Negative.  Negative for rash.  Neurological:  Negative for dizziness and headaches.  All other systems reviewed and are negative.   Vitals:   02/29/24 1453  BP: 128/78  Pulse: 81  Temp: 98.3 F (36.8 C)  SpO2: 96%    Physical Exam Vitals reviewed.  Constitutional:      Appearance: Normal appearance.  HENT:     Head:  Normocephalic.     Right Ear: Tympanic membrane, ear canal and external ear normal.     Left Ear: Tympanic membrane, ear canal and external ear normal.     Mouth/Throat:     Mouth: Mucous membranes are moist.     Pharynx: Oropharynx is clear.  Eyes:     Extraocular Movements: Extraocular movements intact.     Pupils: Pupils are equal, round, and reactive to light.  Neck:     Vascular: No carotid bruit.  Cardiovascular:     Rate and Rhythm: Normal rate and regular rhythm.     Pulses: Normal pulses.     Heart sounds: Normal heart sounds.  Pulmonary:     Effort: Pulmonary effort is normal.     Breath sounds: Normal breath sounds.  Musculoskeletal:     Cervical back: No tenderness.  Lymphadenopathy:     Cervical: No cervical adenopathy.  Skin:    General: Skin is warm and dry.  Neurological:     General: No focal deficit present.     Mental Status: She is alert and oriented to person, place, and time.     Cranial Nerves: No cranial nerve deficit.     Sensory: No sensory deficit.     Motor: No weakness.     Coordination: Coordination normal.     Gait: Gait normal.  Psychiatric:        Mood and Affect: Mood normal.        Behavior: Behavior normal.      ASSESSMENT & PLAN: Problem List Items Addressed This Visit       Nervous and Auditory   Trigeminal neuralgia of right side of face - Primary   Clinically stable.  No red flag signs or symptoms. Differential diagnosis discussed Doubt vasculitic process.  Not behaving like  migraine. Clinical picture consistent with trigeminal neuralgia Unknown trigger or etiology Recommend blood work today Recommend prednisone  20 mg daily and Norco for moderate to severe pain as needed      Relevant Medications   predniSONE  (DELTASONE ) 20 MG tablet   HYDROcodone -acetaminophen  (NORCO/VICODIN) 5-325 MG tablet   Other Relevant Orders   Sedimentation rate   CBC with Differential/Platelet   Comprehensive metabolic panel with GFR   Hemoglobin A1c   TSH   Vitamin B12   VITAMIN D  25 Hydroxy (Vit-D Deficiency, Fractures)     Other   Facial pain   Pain management discussed Recommend Tylenol  and Advil  for mild to moderate pain and Norco for moderate to severe pain Patient takes Elavil  25 mg at bedtime May need neurology evaluation Advised to contact the office if no better or worse during the next several days or weeks.      Relevant Medications   predniSONE  (DELTASONE ) 20 MG tablet   HYDROcodone -acetaminophen  (NORCO/VICODIN) 5-325 MG tablet   Other Relevant Orders   Sedimentation rate   CBC with Differential/Platelet   Comprehensive metabolic panel with GFR   Hemoglobin A1c   TSH   Vitamin B12   VITAMIN D  25 Hydroxy (Vit-D Deficiency, Fractures)   Patient Instructions  Neuralgia del trigmino Trigeminal Neuralgia  La neuralgia del trigmino es un trastorno nervioso que causa dolor intenso en un lado de la cara. El dolor puede durar entre unos segundos y varios minutos, pero puede ocurrir cientos de Immunologist. Habitualmente, el dolor aparece en un lado de la cara. Los sntomas pueden presentarse [address], semanas o meses, y Clinical biochemist por meses o aos. El dolor puede volver  y ser peor que antes. Cules son las causas? Esta afeccin puede ser causada por lo siguiente: Pernell o presin en un nervio de la cabeza que se llama nervio trigmino. Las siguientes acciones pueden desencadenar una crisis: Systems developer. Maquillarse. Lavarse, afeitarse o  tocarse la cara. Cepillarse los dientes. Las rfagas de aire fro o caliente. Trastornos desmielinizantes primarios, como la esclerosis mltiple. Tumores. Qu incrementa el riesgo? Es ms probable que sufra esta afeccin si: Tiene entre 50 y 84 aos de edad. Es mujer. Cules son los signos o sntomas? El sntoma principal de esta afeccin es un dolor intenso en la [place], los labios, los ojos, la [place], el cuero cabelludo, la frente y Recruitment consultant. Cmo se diagnostica? Esta afeccin se diagnostica mediante un examen fsico. Se puede realizar una exploracin por tomografa computarizada (TC) o una resonancia magntica (RM) para descartar la presencia de otros trastornos que pueden causar dolor facial. Cmo se trata? El tratamiento de esta afeccin puede incluir: Medidas para evitar las cosas que desencadenan los sntomas. Medicamentos recetados, [employer]. Procedimientos como ablacin, tratamiento trmico o radioterapia. Terapia cognitiva o conductual. Tratamientos complementarios, tales como: Ejercicio suave y regular o yoga. Meditacin. Aromaterapia. Acupuntura. Ciruga. Esta puede realizarse [employer] casos son graves, si otro tratamiento mdico no brinda alivio. El tratamiento puede demorar hasta un mes en comenzar a Engineer, materials. Siga estas indicaciones en su casa: Control del dolor  Infrmese lo ms que pueda sobre cmo Runner, broadcasting/film/video. Pregntele al mdico si sera til recurrir a Dance movement psychotherapist. Considere hablar con un mdico especializado en salud mental sobre cmo lidiar con Chief Technology Officer. Considere la posibilidad de Advertising account planner en un grupo de apoyo para Chief Technology Officer. Indicaciones generales Use los medicamentos de venta libre y los recetados solamente como se lo haya indicado el mdico. Evite las cosas que desencadenan los sntomas. Puede ser de ayuda: Masticar del lado de la boca que no est afectado. Evitar tocarse el rostro. Evitar las rfagas  de aire fro o caliente. Concurra a todas las visitas de seguimiento. Dnde buscar ms informacin Facial Pain Association (Asociacin del dolor facial): facepain.org Comunquese con un mdico si: Los medicamentos que toma no [employer]. Los medicamentos que toma para el tratamiento le causan efectos secundarios. Le aparecen sntomas nuevos que no se pueden explicar, por ejemplo: Visin doble. Debilidad o adormecimiento facial. Cambios en la audicin o el equilibrio. Se siente deprimida. Solicite ayuda de inmediato si: El dolor es intenso y no mejora. Tiene pensamientos suicidas. Si alguna vez siente que puede lastimarse o Physicist, medical a Economist, o tiene pensamientos de poner fin a su vida, busque ayuda de inmediato. Dirjase al servicio de urgencias ms cercano o: Comunquese con el servicio de emergencias de su localidad (911 en los [place]). Llame a una lnea de [place] para crisis suicidas, como la National Suicide Prevention Lifeline (Lnea Telefnica Nacional para la Prevencin del Suicidio) al (518)283-3017 o al 988 en los [place]. Esta lnea atiende las 24 horas del da en los [place]. Enve un mensaje de texto a la lnea para casos de crisis al [phone] (en los [place] Resumen La neuralgia del trigmino es un trastorno nervioso que causa dolor intenso en un lado de la cara. El dolor puede durar de unos segundos a varios minutos. La causa de esta afeccin es el dao o la presin en un nervio de la cabeza que se llama trigmino. El tratamiento puede incluir evitar las cosas  que desencadenan los sntomas, tomar medicamentos, o someterse a [place] o procedimientos. El tratamiento puede demorar hasta un mes en comenzar a Engineer, materials. Concurra a todas las visitas de seguimiento. Esta informacin no tiene Theme park manager el consejo del mdico. Asegrese de hacerle al mdico cualquier pregunta que tenga. Document Revised: 12/12/2020 Document Reviewed:  12/12/2020 Elsevier Patient Education  2025 Elsevier Inc.    Emil Schaumann, MD [facility]

## 2024-03-01 ENCOUNTER — Ambulatory Visit: Payer: Self-pay

## 2024-03-01 ENCOUNTER — Ambulatory Visit: Payer: Self-pay | Admitting: Emergency Medicine

## 2024-03-01 NOTE — Telephone Encounter (Signed)
 FYI Only or Action Required?: FYI only for provider.  Patient was last seen in primary care on 02/29/2024 by [name] Emil Schanz, MD.  Called Nurse Triage reporting Results.  Triage Disposition: Call PCP Within 24 Hours  Patient/caregiver understands and will follow disposition?: Yes      Copied from CRM #[id number]. Topic: Clinical - Lab/Test Results >> Mar 01, 2024 12:07 PM [name] [name] wrote: Reason for CRM: Agent read message from [name] [name] patient has more questions about labs      Reason for Disposition  Caller requesting lab results  (Exception: Routine or non-urgent lab result.)  Answer Assessment - Initial Assessment Questions Patient advised of the lab results and [name] [name] instructions. She had no further questions at this time.     1. REASON FOR CALL or QUESTION: What is your reason for calling today? or How can I best     Lab results  2. CALLER: Document the source of call. (e.g., laboratory staff, caregiver or patient).     Patient  Protocols used: PCP Call - No Triage-[name]-AH

## 2024-03-15 ENCOUNTER — Ambulatory Visit: Payer: Self-pay

## 2024-03-15 ENCOUNTER — Other Ambulatory Visit: Payer: Self-pay | Admitting: Emergency Medicine

## 2024-03-15 DIAGNOSIS — G500 Trigeminal neuralgia: Secondary | ICD-10-CM

## 2024-03-15 NOTE — Telephone Encounter (Signed)
 Referral to neurology placed today.  Medication not helping, recommend not to take it.

## 2024-03-15 NOTE — Telephone Encounter (Signed)
 FYI Only or Action Required?: Action required by provider: referral request.  Patient was last seen in primary care on 02/29/2024 by Purcell Emil Schanz, MD.  Called Nurse Triage reporting Facial Pain.  Symptoms began about a month ago.  Interventions attempted: Prescription medications: Prednisone , Vicodin.  Symptoms are: unchanged.  Triage Disposition: Home Care  Patient/caregiver understands and will follow disposition?: Yes Reason for Disposition  Face pain present < 24 hours  Answer Assessment - Initial Assessment Questions Pt was seen for facial pain and was given prednisone  and vicodin. Patient finished prednisone  and is still taking vicodin but states that is not helping anything. Patient is requesting more medication and a referral to neurology as soon as possible. Please advise. Call back number [phone].   1. ONSET: When did the pain start? (e.g., minutes, hours, days)     About a month ago  2. ONSET: Does the pain come and go, or has it been constant since it started? (e.g., constant, intermittent, fleeting)     Comes and goes  3. SEVERITY: How bad is the pain? (Scale 1-10; mild, moderate or severe)     Moderate to severe, states medicine PCP gave is not helping at all  4. LOCATION: Where does it hurt?      Right side of face  5. OTHER SYMPTOMS: Do you have any other symptoms? (e.g., fever, toothache, nasal discharge, nasal congestion, clicking sensation in jaw joint)     Feels like always has a small headache  Protocols used: Face Pain-A-AH  Copied from CRM [id number]. Topic: Clinical - Red Word Triage >> Mar 15, 2024  9:36 AM Thersia BROCKS wrote: [facility] that prompted transfer to Nurse Triage: Patient is still having pain in the face and head stated if it wasn't any better she would need to call in to get referral for neurologist

## 2024-03-16 ENCOUNTER — Encounter: Payer: Self-pay | Admitting: Neurology

## 2024-03-17 NOTE — Telephone Encounter (Signed)
 LVM for patient with interpreter to call back regarding her facial pain. Needing to inform patient her provider message of referral placed and advised to stop medication if it is not helping

## 2024-04-24 ENCOUNTER — Telehealth: Payer: Self-pay

## 2024-04-24 NOTE — Telephone Encounter (Signed)
 Copied from CRM #[id number]. Topic: Referral - Question >> Apr 24, 2024  2:36 PM Mercedes MATSU wrote: Reason for CRM: Patient called in wanting to know if a new referral for neurology can be sent over to [facility] Phone: [phone] Fax: Unsure They have open appointments but the patient can't be scheduled until they receive a referral. Patient is requesting a call back once referral is sent so she knows its okay to schedule her appointment.

## 2024-04-25 ENCOUNTER — Ambulatory Visit: Payer: Self-pay

## 2024-04-25 NOTE — Telephone Encounter (Signed)
  FYI Only or Action Required?: FYI only for provider: appointment scheduled on 04/27/2024.  Patient was last seen in primary care on 02/29/2024 by Purcell Emil Schanz, MD.  Called Nurse Triage reporting Facial Pain.  Symptoms began several months ago.  Interventions attempted: Prescription medications: prednisone , norco.  Symptoms are: gradually worsening.  Triage Disposition: Call PCP Now  Patient/caregiver understands and will follow disposition?: Yes Patient called to report that her facial pain has worsened.  Reports burning  pain to her face  Denies fever Denies weakness on one side of her face or body Patient cried with pain during the phone call.    She attempted to schedule the neurology referral, but they have no availability until February. She requests a referral be sent to neurology at [facility] as they have sooner availability.   Will go to ED if pain worsens before her scheduled appointment

## 2024-04-25 NOTE — Telephone Encounter (Signed)
 I have sent a message over to [facility]

## 2024-04-25 NOTE — Telephone Encounter (Signed)
 FYI Only or Action Required?: Action required by provider: referral request.  Patient was last seen in primary care on 02/29/2024 by Purcell Emil Schanz, MD.  Called Nurse Triage reporting Facial Pain.   Triage Disposition: Call PCP Now  Patient/caregiver understands and will follow disposition?: Yes         Copied from CRM #[id number]. Topic: Clinical - Red Word Triage >> Apr 25, 2024  4:01 PM Anairis L wrote: [facility] that prompted transfer to Nurse Triage: Severe Right side face and neck pain. Seen Dr on 02/29/2024 referred to neurologist but no opening until February. Reason for Disposition  [1] Follow-up call from patient regarding patient's clinical status AND [2] information urgent  Answer Assessment - Initial Assessment Questions This RN spoke with the pt regarding her symptoms. Pt states she has continuing R side facial and neck pain. Patient requesting her referral to go to [facility]. She states they have an earlier appointment in December. Phone number [phone]. Pt then disconnected the call before this RN could ask any additional questions.    1. REASON FOR CALL or QUESTION: What is your reason for calling today? or How can I best     Requesting a referral to be sent to another facility  2. CALLER: Document the source of call. (e.g., laboratory staff, caregiver or patient).     Patient  Protocols used: PCP Call - No Triage-A-AH

## 2024-04-26 ENCOUNTER — Ambulatory Visit: Admitting: Internal Medicine

## 2024-04-26 NOTE — Telephone Encounter (Signed)
 Pt has an appointment tomorrow with Dr Norleen

## 2024-04-27 ENCOUNTER — Encounter: Payer: Self-pay | Admitting: Internal Medicine

## 2024-04-27 ENCOUNTER — Ambulatory Visit: Admitting: Internal Medicine

## 2024-04-27 VITALS — BP 122/80 | HR 67 | Temp 98.7°F | Ht 62.0 in | Wt 155.0 lb

## 2024-04-27 DIAGNOSIS — I10 Essential (primary) hypertension: Secondary | ICD-10-CM

## 2024-04-27 DIAGNOSIS — R7303 Prediabetes: Secondary | ICD-10-CM | POA: Diagnosis not present

## 2024-04-27 DIAGNOSIS — G500 Trigeminal neuralgia: Secondary | ICD-10-CM | POA: Diagnosis not present

## 2024-04-27 MED ORDER — CARBAMAZEPINE ER 100 MG PO TB12: 100.0000 mg | ORAL_TABLET | Freq: Two times a day (BID) | ORAL | 5 refills | Status: AC

## 2024-04-27 MED ORDER — VALSARTAN-HYDROCHLOROTHIAZIDE 160-25 MG PO TABS: 1.0000 | ORAL_TABLET | Freq: Every day | ORAL | 3 refills | Status: AC

## 2024-04-27 NOTE — Progress Notes (Signed)
 Patient ID: [name] [name], female   DOB: [birth year], 58 y.o.   MRN: [id number]        Chief Complaint: follow up right trigeminal neuralgia        HPI:  [name] is a 58 y.o. female here to f/u with 3 mo onset right facial pain, not improved with a root canal thought needed per dental, and not better with elavil  25 mg, tylenol , or advil .  Pt denies chest pain, increased sob or doe, wheezing, orthopnea, PND, increased LE swelling, palpitations, dizziness or syncope.   Pt denies polydipsia, polyuria, or new focal neuro s/s.   Pt does state she is convinced the lisinopril  hct causes her HA in the AM after taking, asking for change.  Has Neurology appt in Feb 2026 but cannot wait that long.        Wt Readings from Last 3 Encounters:  04/27/24 155 lb (70.3 kg)  02/29/24 150 lb (68 kg)  10/18/22 135 lb (61.2 kg)   BP Readings from Last 3 Encounters:  04/27/24 122/80  02/29/24 128/78  10/18/22 123/83         Past Medical History:  Diagnosis Date   Acid reflux    Acute UTI 05/19/2018   Chest pain    09/15/2017 - normal stress echo   Chronic migraine w/o aura w/o status migrainosus, not intractable 08/10/2017   Diabetes mellitus without complication (HCC)    Dysplasia of cervix, low grade (CIN 1) 10/30/2015   09/2015- LSIL 10/2015- CIN 1 09/2016- NIL- no cotest done 04/2018- NIL, +HPV 06/2019- LSIL, HPV POS 07/2019- CIN 1   Endometriosis    Essential hypertension, benign 07/25/2012   Gastritis    Hyperlipidemia    Hypertension    Migraine    OSA (obstructive sleep apnea) 12/29/2018   does not use CPAP   Plantar fasciitis of right foot 10/05/2013   Varicose veins    Past Surgical History:  Procedure Laterality Date   CESAREAN SECTION     She had 5 C-Sections   COLONOSCOPY      reports that she has never smoked. She has never used smokeless tobacco. She reports that she does not drink alcohol and does not use drugs. family history includes Diabetes in her father; Heart attack in  her father; Hypertension in her father and mother; Stroke in her father; Thyroid disease in her mother. No Known Allergies Current Outpatient Medications on File Prior to Visit  Medication Sig Dispense Refill   albuterol  (VENTOLIN  HFA) 108 (90 Base) MCG/ACT inhaler Inhale 2 puffs into the lungs every 6 (six) hours as needed for wheezing or shortness of breath. 8 g 6   amitriptyline  (ELAVIL ) 25 MG tablet TAKE 1 TABLET BY MOUTH AT BEDTIME 90 tablet 3   aspirin  EC 81 MG tablet Take 1 tablet (81 mg total) by mouth daily. (Patient taking differently: Take 81 mg by mouth daily as needed for mild pain (pain score 1-3).) 90 tablet 3   atorvastatin  (LIPITOR) 20 MG tablet TAKE ONE TABLET BY MOUTH AT BEDTINE (Patient taking differently: Take 20 mg by mouth at bedtime.) 90 tablet 3   HYDROcodone -acetaminophen  (NORCO/VICODIN) 5-325 MG tablet Take 1-2 tablets by mouth every 6 (six) hours as needed for moderate pain (pain score 4-6). 30 tablet 0   metFORMIN  (GLUCOPHAGE ) 500 MG tablet Take 1 tablet (500 mg total) by mouth 2 (two) times daily with a meal. 180 tablet 3   Multiple Vitamin (MULTIVITAMIN ADULT PO) Take 1 tablet  by mouth daily.     sucralfate  (CARAFATE ) 1 g tablet Take 1 tablet (1 g total) by mouth 4 (four) times daily -  with meals and at bedtime. Take medication as needed for pain 60 tablet 1   No current facility-administered medications on file prior to visit.        ROS:  All others reviewed and negative.  Objective        PE:  BP 122/80 (BP Location: Right Arm, Patient Position: Sitting, Cuff Size: Normal)   Pulse 67   Temp 98.7 F (37.1 C) (Oral)   Ht 5' 2 (1.575 m)   Wt 155 lb (70.3 kg)   LMP 05/12/2012   SpO2 99%   BMI 28.35 kg/m                 Constitutional: Pt appears in NAD               HENT: Head: NCAT.                Right Ear: External ear normal.  Sensitive to touch over right face but no swelling or skin change               Left Ear: External ear normal.                 Eyes: . Pupils are equal, round, and reactive to light. Conjunctivae and EOM are normal               Nose: without d/c or deformity               Neck: Neck supple. Gross normal ROM               Cardiovascular: Normal rate and regular rhythm.                 Pulmonary/Chest: Effort normal and breath sounds without rales or wheezing.                Neurological: Pt is alert. At baseline orientation, motor grossly intact               Skin: Skin is warm. No rashes, no other new lesions, LE edema - none               Psychiatric: Pt behavior is normal without agitation   Micro: none  Cardiac tracings I have personally interpreted today:  none  Pertinent Radiological findings (summarize): none   Lab Results  Component Value Date   WBC 5.2 02/29/2024   HGB 13.9 02/29/2024   HCT 41.2 02/29/2024   PLT 253.0 02/29/2024   GLUCOSE 119 (H) 02/29/2024   CHOL 155 06/11/2022   TRIG 197.0 (H) 06/11/2022   HDL 39.10 06/11/2022   LDLCALC 76 06/11/2022   ALT 20 02/29/2024   AST 21 02/29/2024   NA 139 02/29/2024   K 3.8 02/29/2024   CL 104 02/29/2024   CREATININE 0.73 02/29/2024   BUN 16 02/29/2024   CO2 27 02/29/2024   TSH 2.22 02/29/2024   HGBA1C 6.2 02/29/2024   Assessment/Plan:  [name] [name] is a 58 y.o. Other or two or more races [6] female with  has a past medical history of Acid reflux, Acute UTI (05/19/2018), Chest pain, Chronic migraine w/o aura w/o status migrainosus, not intractable (08/10/2017), Diabetes mellitus without complication (HCC), Dysplasia of cervix, low grade (CIN 1) (10/30/2015), Endometriosis, Essential hypertension, benign (07/25/2012), Gastritis, Hyperlipidemia, Hypertension, Migraine, OSA (obstructive  sleep apnea) (12/29/2018), Plantar fasciitis of right foot (10/05/2013), and Varicose veins.  Trigeminal neuralgia of right side of face With severe persistent pain x 3 mo not improved with prior tx - now for start tegretol xr 100 bid, and refer Neurosurgury at  [facility] - ? Gamma knife candidate  Essential hypertension, benign Pt states is convinced she has HA in the AM after taking the lisinopril  hct - ok for change diovan hct 160 25  every day - f/u 4 wks  Prediabetes Lab Results  Component Value Date   HGBA1C 6.2 02/29/2024   Stable, pt to continue current medical treatment metformin  500 mg bid  Followup: Return in about 4 weeks (around 05/25/2024).  Lynwood Rush, MD 04/27/2024 4:55 PM [facility]

## 2024-04-27 NOTE — Assessment & Plan Note (Signed)
 With severe persistent pain x 3 mo not improved with prior tx - now for start tegretol  xr 100 bid, and refer Neurosurgury at [facility] - ? Gamma knife candidate

## 2024-04-27 NOTE — Assessment & Plan Note (Signed)
 Pt states is convinced she has HA in the AM after taking the lisinopril  hct - ok for change diovan  hct 160 25  every day - f/u 4 wks

## 2024-04-27 NOTE — Patient Instructions (Signed)
 Ok to STOP the lisinopril  HCT  Please take all new medication as prescribed - the DIOVAN HCT  Please take all new medication as prescribed - the Tegretol for the facial pain  Please continue all other medications as before, and refills have been done if requested.  Please have the pharmacy call with any other refills you may need.  Please keep your appointments with your specialists as you may have planned  - Neurology in Feb 2026  You will be contacted regarding the referral for: Neurosurgury [facility] for possible gamma knife treatment  Please make an Appointment to return in 1 months, or sooner if needed

## 2024-04-27 NOTE — Assessment & Plan Note (Signed)
 Lab Results  Component Value Date   HGBA1C 6.2 02/29/2024   Stable, pt to continue current medical treatment metformin  500 mg bid

## 2024-04-28 NOTE — Progress Notes (Deleted)
 NEUROLOGY CONSULTATION NOTE  [name] [name] MRN: [id number] DOB: [birth year]  Referring provider: Emil Aloysius Schaumann, MD Primary care provider: Emil Aloysius Schaumann, MD  Reason for consult:  right sided trigeminal neuralgia  Assessment/Plan:   ***   Subjective:  [name] [name] is a 58 year old female with migraines, DM 2, HTN, HLD and OSA who presents for right-sided trigeminal neuralgia.  History supplemented by referring provider's note.  PCP prescribed Norco for moderate severe pain and recommended Tylenol  and Advil  for mild to moderate pain. No improvement.  Started Tegretol -XR 100mg  twice daily on 11/20.      02/29/2024 LABS:  Sed rate 17; CMP with Na 139, K 3.8, Cl 104, CO2 27, glucose 119, Ca 9.7, BUN 16, Cr 0.73, t bili 0.3, ALP 101, AST 21, ALT 20; CBC with WBC 5.2, HGB 13.9, HCT 41.2, PLT 253  PAST MEDICAL HISTORY: Past Medical History:  Diagnosis Date   Acid reflux    Acute UTI 05/19/2018   Chest pain    09/15/2017 - normal stress echo   Chronic migraine w/o aura w/o status migrainosus, not intractable 08/10/2017   Diabetes mellitus without complication (HCC)    Dysplasia of cervix, low grade (CIN 1) 10/30/2015   09/2015- LSIL 10/2015- CIN 1 09/2016- NIL- no cotest done 04/2018- NIL, +HPV 06/2019- LSIL, HPV POS 07/2019- CIN 1   Endometriosis    Essential hypertension, benign 07/25/2012   Gastritis    Hyperlipidemia    Hypertension    Migraine    OSA (obstructive sleep apnea) 12/29/2018   does not use CPAP   Plantar fasciitis of right foot 10/05/2013   Varicose veins     PAST SURGICAL HISTORY: Past Surgical History:  Procedure Laterality Date   CESAREAN SECTION     She had 5 C-Sections   COLONOSCOPY      MEDICATIONS: Current Outpatient Medications on File Prior to Visit  Medication Sig Dispense Refill   albuterol  (VENTOLIN  HFA) 108 (90 Base) MCG/ACT inhaler Inhale 2 puffs into the lungs every 6 (six) hours as needed for wheezing or shortness of  breath. 8 g 6   amitriptyline  (ELAVIL ) 25 MG tablet TAKE 1 TABLET BY MOUTH AT BEDTIME 90 tablet 3   aspirin  EC 81 MG tablet Take 1 tablet (81 mg total) by mouth daily. (Patient taking differently: Take 81 mg by mouth daily as needed for mild pain (pain score 1-3).) 90 tablet 3   atorvastatin  (LIPITOR) 20 MG tablet TAKE ONE TABLET BY MOUTH AT BEDTINE (Patient taking differently: Take 20 mg by mouth at bedtime.) 90 tablet 3   carbamazepine  (TEGRETOL -XR) 100 MG 12 hr tablet Take 1 tablet (100 mg total) by mouth 2 (two) times daily. 60 tablet 5   HYDROcodone -acetaminophen  (NORCO/VICODIN) 5-325 MG tablet Take 1-2 tablets by mouth every 6 (six) hours as needed for moderate pain (pain score 4-6). 30 tablet 0   metFORMIN  (GLUCOPHAGE ) 500 MG tablet Take 1 tablet (500 mg total) by mouth 2 (two) times daily with a meal. 180 tablet 3   Multiple Vitamin (MULTIVITAMIN ADULT PO) Take 1 tablet by mouth daily.     sucralfate  (CARAFATE ) 1 g tablet Take 1 tablet (1 g total) by mouth 4 (four) times daily -  with meals and at bedtime. Take medication as needed for pain 60 tablet 1   valsartan -hydrochlorothiazide  (DIOVAN -HCT) 160-25 MG tablet Take 1 tablet by mouth daily. 90 tablet 3   No current facility-administered medications on file prior to visit.  ALLERGIES: No Known Allergies  FAMILY HISTORY: Family History  Problem Relation Age of Onset   Hypertension Mother    Thyroid disease Mother    Stroke Father    Hypertension Father    Diabetes Father    Heart attack Father    Colon cancer Neg Hx    Esophageal cancer Neg Hx    Pancreatic cancer Neg Hx    Stomach cancer Neg Hx    Liver disease Neg Hx    Rectal cancer Neg Hx     Objective:  *** General: No acute distress.  Patient appears well-groomed.   Head:  Normocephalic/atraumatic Eyes:  fundi examined but not visualized Neck: supple, no paraspinal tenderness, full range of motion Heart: regular rate and rhythm Neurological Exam: Mental  status: alert and oriented to person, place, and time, speech fluent and not dysarthric, language intact. Cranial nerves: CN I: not tested CN II: pupils equal, round and reactive to light, visual fields intact CN III, IV, VI:  full range of motion, no nystagmus, no ptosis CN V: facial sensation intact. CN VII: upper and lower face symmetric CN VIII: hearing intact CN IX, X: gag intact, uvula midline CN XI: sternocleidomastoid and trapezius muscles intact CN XII: tongue midline Bulk & Tone: normal, no fasciculations. Motor:  muscle strength 5/5 throughout Sensation:  Pinprick and vibratory sensation intact. Deep Tendon Reflexes:  2+ throughout,  toes downgoing.   Finger to nose testing:  Without dysmetria.   Gait:  Normal station and stride.  Romberg negative.    Thank you for allowing me to take part in the care of this patient.  Juliene Dunnings, DO  CC: ***

## 2024-05-01 ENCOUNTER — Ambulatory Visit: Admitting: Neurology

## 2024-05-02 NOTE — Telephone Encounter (Signed)
 A new referral would need to be placed and I have contacted the patient and LVM letting her know that we need more information for this referral such as in location, number, fax number

## 2024-05-19 ENCOUNTER — Telehealth: Payer: Self-pay

## 2024-05-19 DIAGNOSIS — G500 Trigeminal neuralgia: Secondary | ICD-10-CM

## 2024-05-19 NOTE — Telephone Encounter (Signed)
 Copied from CRM #[id number]. Topic: Referral - Status >> May 19, 2024  1:35 PM Anairis L wrote: Reason for CRM: Can someone please resend referral to [facility] - [facility] 4th [facility]. They have not received referral.

## 2024-05-19 NOTE — Addendum Note (Signed)
 Addended by: ROSALVA LEX RAMAN on: 05/19/2024 04:14 PM   Modules accepted: Orders

## 2024-05-22 NOTE — Telephone Encounter (Unsigned)
 Copied from CRM #[id number]. Topic: Referral - Status >> May 22, 2024  4:28 PM [name] [name] wrote: Reason for CRM: Patient states [facility] is awaiting a referral from PCP, and has not received it . Patient reached out Dec 11th. And hasn't heard back from provider and requests it be sent in so she can schedule Fax number [phone]

## 2024-05-23 NOTE — Telephone Encounter (Signed)
 This has been sent on 04/27/2024

## 2024-05-23 NOTE — Telephone Encounter (Signed)
 I have printed and re fax back over again

## 2024-06-19 ENCOUNTER — Other Ambulatory Visit: Payer: Self-pay | Admitting: Emergency Medicine

## 2024-06-19 ENCOUNTER — Telehealth: Payer: Self-pay

## 2024-06-19 NOTE — Telephone Encounter (Signed)
 Copied from CRM #[id number]. Topic: Clinical - Medication Question >> Jun 19, 2024  4:16 PM [name] [name] wrote: Reason for CRM: Patient called in stating that she would like to switch her blood pressure back to lisinoprol back from valsartan -hydrochlorothiazide  (DIOVAN -HCT) 160-25 MG tablet,she said that it causes her  to have headache.

## 2024-06-19 NOTE — Telephone Encounter (Unsigned)
 Copied from CRM #[id number]. Topic: Clinical - Medication Refill >> Jun 19, 2024  4:19 PM Burnard DEL wrote: Medication: amitriptyline  (ELAVIL ) 25 MG tablet  Has the patient contacted their pharmacy? No (Agent: If no, request that the patient contact the pharmacy for the refill. If patient does not wish to contact the pharmacy document the reason why and proceed with request.) (Agent: If yes, when and what did the pharmacy advise?)  This is the patient's preferred pharmacy:  [facility] [id number] - [place], [place] - [address] [place] [address] Phone: [phone] Fax: [phone]   Is this the correct pharmacy for this prescription? Yes If no, delete pharmacy and type the correct one.   Has the prescription been filled recently? No  Is the patient out of the medication? Yes  Has the patient been seen for an appointment in the last year OR does the patient have an upcoming appointment? Yes  Can we respond through MyChart? Yes  Agent: Please be advised that Rx refills may take up to 3 business days. We ask that you follow-up with your pharmacy.

## 2024-06-20 ENCOUNTER — Telehealth: Payer: Self-pay

## 2024-06-20 NOTE — Telephone Encounter (Signed)
 Needs office visit with me please.  Thanks.

## 2024-06-20 NOTE — Telephone Encounter (Signed)
 Tried to reach pt as PCP has stated pt is needing to have an office visit with him in regards to medication changes.  Please schedule pt an OV with PCP upon her call back to the office.

## 2024-06-20 NOTE — Telephone Encounter (Signed)
 Copied from CRM #[id number]. Topic: General - Other >> Jun 20, 2024 10:17 AM Zebedee SAUNDERS wrote: Reason for CRM: Pt returning Scruggs-Hernandez, Hadassah SAUNDERS, CMA call, pt is refusing to schedule appt to see Dr. Purcell because she does not have money to pay for doctor's visit and the last time she was here Dr. Joshua did not give her any medication. Please call pt at  [phone].

## 2024-06-21 MED ORDER — AMITRIPTYLINE HCL 25 MG PO TABS: 25.0000 mg | ORAL_TABLET | Freq: Every day | ORAL | 0 refills | Status: AC

## 2024-06-21 NOTE — Telephone Encounter (Signed)
 Okay, so what is her question?

## 2024-06-27 ENCOUNTER — Telehealth: Payer: Self-pay

## 2024-06-27 NOTE — Telephone Encounter (Signed)
 Copied from CRM #[id number]. Topic: Referral - Question >> Jun 27, 2024 10:29 AM Laymon HERO wrote: Reason for CRM: Patient daughter calling to check on referral that was sent in Nov and Dec to [facility]. She still has not heard anything

## 2024-06-27 NOTE — Telephone Encounter (Signed)
 I have sent a message to [name] [name] to look into this for me again

## 2024-06-27 NOTE — Telephone Encounter (Signed)
 Copied from CRM #[id number]. Topic: Referral - Status >> Jun 27, 2024 10:48 AM Vena HERO wrote: Reason for CRM: Pt daughter Damien [phone] , called in to check the status of referral that was completed in December for neurology. States that they have not heard from neuro to make an appt and when she called them, they stated they never received a referral from us . They gave her fax number [phone] to resend the referral too. Please call to advise this is complete.

## 2024-06-27 NOTE — Telephone Encounter (Signed)
 I have spoken with [name] [name] as of today and she stated that she has refaxed the referral to the [facility] again as if today

## 2024-07-13 ENCOUNTER — Ambulatory Visit: Admitting: Neurology

## 2024-08-22 ENCOUNTER — Ambulatory Visit: Admitting: Neurology
